# Patient Record
Sex: Female | Born: 1975 | Race: Black or African American | Hispanic: Yes | Marital: Single | Smoking: Current every day smoker
Health system: Southern US, Community
[De-identification: ages and names within clinical notes are randomized; demographics above are authoritative.]

## PROBLEM LIST (undated history)

## (undated) DIAGNOSIS — C3490 Malignant neoplasm of unspecified part of unspecified bronchus or lung: Secondary | ICD-10-CM

## (undated) DIAGNOSIS — F419 Anxiety disorder, unspecified: Secondary | ICD-10-CM

## (undated) DIAGNOSIS — M1990 Unspecified osteoarthritis, unspecified site: Secondary | ICD-10-CM

## (undated) DIAGNOSIS — K219 Gastro-esophageal reflux disease without esophagitis: Secondary | ICD-10-CM

## (undated) DIAGNOSIS — G43909 Migraine, unspecified, not intractable, without status migrainosus: Secondary | ICD-10-CM

## (undated) DIAGNOSIS — Z789 Other specified health status: Secondary | ICD-10-CM

## (undated) DIAGNOSIS — T7840XA Allergy, unspecified, initial encounter: Secondary | ICD-10-CM

## (undated) HISTORY — DX: Allergy, unspecified, initial encounter: T78.40XA

## (undated) HISTORY — PX: NO PAST SURGERIES: SHX2092

## (undated) HISTORY — DX: Anxiety disorder, unspecified: F41.9

## (undated) HISTORY — DX: Migraine, unspecified, not intractable, without status migrainosus: G43.909

## (undated) HISTORY — DX: Malignant neoplasm of unspecified part of unspecified bronchus or lung: C34.90

## (undated) HISTORY — DX: Unspecified osteoarthritis, unspecified site: M19.90

## (undated) HISTORY — DX: Gastro-esophageal reflux disease without esophagitis: K21.9

---

## 2004-12-21 HISTORY — PX: LASIK: SHX215

## 2008-03-12 ENCOUNTER — Emergency Department (HOSPITAL_COMMUNITY): Admission: EM | Admit: 2008-03-12 | Discharge: 2008-03-12 | Payer: Self-pay | Admitting: Emergency Medicine

## 2009-05-31 ENCOUNTER — Emergency Department (HOSPITAL_COMMUNITY): Admission: EM | Admit: 2009-05-31 | Discharge: 2009-06-01 | Payer: Self-pay | Admitting: Emergency Medicine

## 2011-03-30 LAB — URINALYSIS, ROUTINE W REFLEX MICROSCOPIC
Glucose, UA: NEGATIVE mg/dL
Ketones, ur: 15 mg/dL — AB
Leukocytes, UA: NEGATIVE
Nitrite: NEGATIVE
Protein, ur: NEGATIVE mg/dL
Urobilinogen, UA: 1 mg/dL (ref 0.0–1.0)

## 2011-03-30 LAB — URINE MICROSCOPIC-ADD ON

## 2013-05-31 ENCOUNTER — Encounter (HOSPITAL_COMMUNITY): Payer: Self-pay | Admitting: Emergency Medicine

## 2013-05-31 ENCOUNTER — Emergency Department (HOSPITAL_COMMUNITY)
Admission: EM | Admit: 2013-05-31 | Discharge: 2013-05-31 | Disposition: A | Discharge status: Home / Self Care | Payer: No Typology Code available for payment source | Attending: Emergency Medicine | Admitting: Emergency Medicine

## 2013-05-31 DIAGNOSIS — S99929A Unspecified injury of unspecified foot, initial encounter: Secondary | ICD-10-CM | POA: Insufficient documentation

## 2013-05-31 DIAGNOSIS — S139XXA Sprain of joints and ligaments of unspecified parts of neck, initial encounter: Secondary | ICD-10-CM | POA: Insufficient documentation

## 2013-05-31 DIAGNOSIS — Y92410 Unspecified street and highway as the place of occurrence of the external cause: Secondary | ICD-10-CM | POA: Insufficient documentation

## 2013-05-31 DIAGNOSIS — S8992XA Unspecified injury of left lower leg, initial encounter: Secondary | ICD-10-CM

## 2013-05-31 DIAGNOSIS — S8991XA Unspecified injury of right lower leg, initial encounter: Secondary | ICD-10-CM

## 2013-05-31 DIAGNOSIS — S8990XA Unspecified injury of unspecified lower leg, initial encounter: Secondary | ICD-10-CM | POA: Insufficient documentation

## 2013-05-31 DIAGNOSIS — Y9389 Activity, other specified: Secondary | ICD-10-CM | POA: Insufficient documentation

## 2013-05-31 DIAGNOSIS — S161XXA Strain of muscle, fascia and tendon at neck level, initial encounter: Secondary | ICD-10-CM

## 2013-05-31 DIAGNOSIS — F17200 Nicotine dependence, unspecified, uncomplicated: Secondary | ICD-10-CM | POA: Insufficient documentation

## 2013-05-31 MED ORDER — METHOCARBAMOL 750 MG PO TABS: 750.0000 mg | ORAL_TABLET | Freq: Four times a day (QID) | ORAL | Status: DC | PRN

## 2013-05-31 MED ORDER — NAPROXEN 500 MG PO TABS: 500.0000 mg | ORAL_TABLET | Freq: Two times a day (BID) | ORAL | Status: DC | PRN

## 2013-05-31 NOTE — ED Provider Notes (Signed)
  Medical screening examination/treatment/procedure(s) were performed by non-physician practitioner and as supervising physician I was immediately available for consultation/collaboration.    Gerhard Munch, MD 05/31/13 [phone]
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
## 2016-05-25 ENCOUNTER — Encounter (HOSPITAL_COMMUNITY): Payer: Self-pay | Admitting: Emergency Medicine

## 2016-05-25 ENCOUNTER — Emergency Department (HOSPITAL_COMMUNITY)
Admission: EM | Admit: 2016-05-25 | Discharge: 2016-05-25 | Discharge status: Against Medical Advice | Payer: 59 | Attending: Emergency Medicine | Admitting: Emergency Medicine

## 2016-05-25 DIAGNOSIS — F17200 Nicotine dependence, unspecified, uncomplicated: Secondary | ICD-10-CM | POA: Insufficient documentation

## 2016-05-25 DIAGNOSIS — N939 Abnormal uterine and vaginal bleeding, unspecified: Secondary | ICD-10-CM | POA: Diagnosis present

## 2016-05-25 DIAGNOSIS — N938 Other specified abnormal uterine and vaginal bleeding: Secondary | ICD-10-CM | POA: Diagnosis not present

## 2016-05-25 DIAGNOSIS — Z3202 Encounter for pregnancy test, result negative: Secondary | ICD-10-CM | POA: Insufficient documentation

## 2016-05-25 DIAGNOSIS — R61 Generalized hyperhidrosis: Secondary | ICD-10-CM | POA: Insufficient documentation

## 2016-05-25 LAB — WET PREP, GENITAL
Clue Cells Wet Prep HPF POC: NONE SEEN
Sperm: NONE SEEN
TRICH WET PREP: NONE SEEN
YEAST WET PREP: NONE SEEN

## 2016-05-25 LAB — CBC WITH DIFFERENTIAL/PLATELET
BASOS ABS: 0.1 10*3/uL (ref 0.0–0.1)
BASOS PCT: 1 %
Eosinophils Absolute: 0.2 10*3/uL (ref 0.0–0.7)
Eosinophils Relative: 2 %
HCT: 41 % (ref 36.0–46.0)
Hemoglobin: 13.8 g/dL (ref 12.0–15.0)
Lymphocytes Relative: 30 %
Lymphs Abs: 3.1 10*3/uL (ref 0.7–4.0)
MCH: 30 pg (ref 26.0–34.0)
MCHC: 33.7 g/dL (ref 30.0–36.0)
MCV: 89.1 fL (ref 78.0–100.0)
MONO ABS: 0.6 10*3/uL (ref 0.1–1.0)
Monocytes Relative: 5 %
NEUTROS ABS: 6.3 10*3/uL (ref 1.7–7.7)
NEUTROS PCT: 62 %
PLATELETS: 253 10*3/uL (ref 150–400)
RBC: 4.6 MIL/uL (ref 3.87–5.11)
RDW: 12.4 % (ref 11.5–15.5)
WBC: 10.2 10*3/uL (ref 4.0–10.5)

## 2016-05-25 LAB — I-STAT CHEM 8, ED
BUN: 13 mg/dL (ref 6–20)
CALCIUM ION: 1.19 mmol/L (ref 1.12–1.23)
Chloride: 102 mmol/L (ref 101–111)
Creatinine, Ser: 0.6 mg/dL (ref 0.44–1.00)
Glucose, Bld: 123 mg/dL — ABNORMAL HIGH (ref 65–99)
HCT: 43 % (ref 36.0–46.0)
Hemoglobin: 14.6 g/dL (ref 12.0–15.0)
Potassium: 3.4 mmol/L — ABNORMAL LOW (ref 3.5–5.1)
SODIUM: 141 mmol/L (ref 135–145)
TCO2: 26 mmol/L (ref 0–100)

## 2016-05-25 LAB — POC URINE PREG, ED: PREG TEST UR: NEGATIVE

## 2016-05-25 NOTE — ED Provider Notes (Signed)
CSN: [id number]     Arrival date & time 05/25/16  1825 History   First MD Initiated Contact with Patient 05/25/16 2126     Chief Complaint  Patient presents with  . Vaginal Bleeding     (Consider location/radiation/quality/duration/timing/severity/associated sxs/prior Treatment) HPI Comments: This a 40 year old female who reports that after intercourse on Saturday she noticed brown discharge and spotting.  She states prior to intercourse.  She had some abdominal pain, periumbilical, very vague in nature.  She thought her belt was too tight.  She denies any vaginal discharge, vaginal trauma, dysuria, frequency, history of seen.  She has one sexual partner.  They report that there was no trauma during intercourse.  Her partner does not have any piercings on his penile shaft.  She reports that she has 2 children ages 52 and 59 between those children.  She did not use any form of birth control and has not used any since and has not gotten pregnant.  She has not been to a physician in [place] 10 years.  No reported gynecological surgeries abnormal Pap smears, no family history of gynecological issues  Patient is a 40 y.o. female presenting with vaginal bleeding.  Vaginal Bleeding Quality:  Spotting Severity:  Moderate Onset quality:  Sudden Timing:  Rare Progression:  Improving Chronicity:  New Menstrual history:  Regular Number of pads used:  0 Number of tampons used:  0 Possible pregnancy: yes   Context: after intercourse   Context: not genital trauma   Relieved by:  None tried Worsened by:  Nothing tried Ineffective treatments:  None tried Associated symptoms: abdominal pain   Associated symptoms: no dyspareunia, no dysuria, no fever, no nausea and no vaginal discharge   Risk factors: unprotected sex   Risk factors: no gynecological surgery, does not have multiple partners, no new sexual partner, no ovarian cysts, no prior miscarriage and no STD     History reviewed. No pertinent past  medical history. History reviewed. No pertinent past surgical history. No family history on file. Social History  Substance Use Topics  . Smoking status: Current Every Day Smoker  . Smokeless tobacco: None  . Alcohol Use: No   OB History    No data available     Review of Systems  Constitutional: Negative for fever and chills.  Respiratory: Negative for shortness of breath.   Cardiovascular: Negative for chest pain.  Gastrointestinal: Positive for abdominal pain. Negative for nausea, vomiting, diarrhea and constipation.  Genitourinary: Positive for vaginal bleeding. Negative for dysuria, vaginal discharge, vaginal pain and dyspareunia.  Skin: Negative for rash.  All other systems reviewed and are negative.     Allergies  Review of patient's allergies indicates no known allergies.  Home Medications   Prior to Admission medications   Medication Sig Start Date End Date Taking? Authorizing Provider  methocarbamol (ROBAXIN) 750 MG tablet Take 1 tablet (750 mg total) by mouth 4 (four) times daily as needed (Take 1 tablet every 6 hours as needed for muscle spasms.). Patient not taking: Reported on 05/25/2016 05/31/13   Jarrett Soho Muthersbaugh, PA-C  naproxen (NAPROSYN) 500 MG tablet Take 1 tablet (500 mg total) by mouth 2 (two) times daily as needed. Patient not taking: Reported on 05/25/2016 05/31/13   Jarrett Soho Muthersbaugh, PA-C   BP 129/94 mmHg  Pulse 82  Temp(Src) 98.2 F (36.8 C) (Oral)  Resp 16  SpO2 99%  LMP 03/30/2016 (Approximate) Physical Exam  Constitutional: She is oriented to person, place, and time. She appears well-developed  and well-nourished.  HENT:  Head: Normocephalic.  Eyes: Pupils are equal, round, and reactive to light.  Neck: Normal range of motion.  Cardiovascular: Normal rate.   Pulmonary/Chest: Effort normal and breath sounds normal.  Abdominal: Soft. She exhibits no distension. There is no tenderness.  Genitourinary: Vagina normal and uterus normal. Cervix  exhibits no motion tenderness and no discharge. Right adnexum displays no tenderness and no fullness. Left adnexum displays no tenderness and no fullness. No vaginal discharge found.  Do not see any trauma to the vaginal walls or introitus.  There is a small very small amount of brown speckles in what appears to be normal.  Vaginal discharge.  Cervical os is closed.  Cervix is atraumatic  Musculoskeletal: Normal range of motion.  Neurological: She is alert and oriented to person, place, and time.  Skin: Skin is warm. She is diaphoretic.  Nursing note and vitals reviewed.   ED Course  Procedures (including critical care time) Labs Review Labs Reviewed  WET PREP, GENITAL - Abnormal; Notable for the following:    WBC, Wet Prep HPF POC MODERATE (*)    All other components within normal limits  I-STAT CHEM 8, ED - Abnormal; Notable for the following:    Potassium 3.4 (*)    Glucose, Bld 123 (*)    All other components within normal limits  CBC WITH DIFFERENTIAL/PLATELET  POC URINE PREG, ED  GC/CHLAMYDIA PROBE AMP () NOT AT [facility]    Imaging Review No results found. I have personally reviewed and evaluated these images and lab results as part of my medical decision-making.   EKG Interpretation None     Patient left prior to results  MDM   Final diagnoses:  Vaginal spotting         Junius Creamer, NP 05/25/16 [place], MD 05/26/16 [phone]

## 2016-05-25 NOTE — ED Notes (Signed)
Patient spoke with tech and left AMA, did not want to wait for results. NP notified.

## 2016-05-25 NOTE — ED Notes (Signed)
Patient with bleeding after intercourse on Saturday.  Patient states that she is bleeding lightly now.  She is not sure if she had a miscarriage, but her and her partner had clots on them after intercourse.  Patient has also had headaches and vomiting.  She states she is still cramping.

## 2016-05-25 NOTE — ED Notes (Signed)
No answer in waiting.

## 2016-05-27 LAB — GC/CHLAMYDIA PROBE AMP (~~LOC~~) NOT AT ARMC
Chlamydia: NEGATIVE
NEISSERIA GONORRHEA: NEGATIVE

## 2016-09-17 ENCOUNTER — Encounter (HOSPITAL_COMMUNITY): Payer: Self-pay | Admitting: *Deleted

## 2016-09-17 ENCOUNTER — Emergency Department (HOSPITAL_COMMUNITY)
Admission: EM | Admit: 2016-09-17 | Discharge: 2016-09-18 | Disposition: A | Discharge status: Home / Self Care | Payer: Medicaid Other | Attending: Emergency Medicine | Admitting: Emergency Medicine

## 2016-09-17 DIAGNOSIS — R1031 Right lower quadrant pain: Secondary | ICD-10-CM

## 2016-09-17 DIAGNOSIS — Z3A01 Less than 8 weeks gestation of pregnancy: Secondary | ICD-10-CM | POA: Insufficient documentation

## 2016-09-17 DIAGNOSIS — O0281 Inappropriate change in quantitative human chorionic gonadotropin (hCG) in early pregnancy: Secondary | ICD-10-CM | POA: Insufficient documentation

## 2016-09-17 DIAGNOSIS — R109 Unspecified abdominal pain: Secondary | ICD-10-CM

## 2016-09-17 DIAGNOSIS — R1030 Lower abdominal pain, unspecified: Secondary | ICD-10-CM | POA: Diagnosis present

## 2016-09-17 DIAGNOSIS — O2341 Unspecified infection of urinary tract in pregnancy, first trimester: Secondary | ICD-10-CM | POA: Insufficient documentation

## 2016-09-17 DIAGNOSIS — O26899 Other specified pregnancy related conditions, unspecified trimester: Secondary | ICD-10-CM

## 2016-09-17 DIAGNOSIS — Z87891 Personal history of nicotine dependence: Secondary | ICD-10-CM | POA: Diagnosis not present

## 2016-09-17 LAB — URINALYSIS, ROUTINE W REFLEX MICROSCOPIC
Bilirubin Urine: NEGATIVE
Glucose, UA: NEGATIVE mg/dL
Ketones, ur: NEGATIVE mg/dL
LEUKOCYTES UA: NEGATIVE
NITRITE: NEGATIVE
PH: 5.5 (ref 5.0–8.0)
Protein, ur: NEGATIVE mg/dL
SPECIFIC GRAVITY, URINE: 1.028 (ref 1.005–1.030)

## 2016-09-17 LAB — COMPREHENSIVE METABOLIC PANEL
ALK PHOS: 44 U/L (ref 38–126)
ALT: 22 U/L (ref 14–54)
AST: 16 U/L (ref 15–41)
Albumin: 4.3 g/dL (ref 3.5–5.0)
Anion gap: 8 (ref 5–15)
BUN: 12 mg/dL (ref 6–20)
CHLORIDE: 102 mmol/L (ref 101–111)
CO2: 26 mmol/L (ref 22–32)
CREATININE: 0.67 mg/dL (ref 0.44–1.00)
Calcium: 9.6 mg/dL (ref 8.9–10.3)
Glucose, Bld: 115 mg/dL — ABNORMAL HIGH (ref 65–99)
Potassium: 3.5 mmol/L (ref 3.5–5.1)
SODIUM: 136 mmol/L (ref 135–145)
TOTAL PROTEIN: 6.7 g/dL (ref 6.5–8.1)
Total Bilirubin: 0.5 mg/dL (ref 0.3–1.2)

## 2016-09-17 LAB — CBC
HEMATOCRIT: 42.2 % (ref 36.0–46.0)
HEMOGLOBIN: 14 g/dL (ref 12.0–15.0)
MCH: 30.2 pg (ref 26.0–34.0)
MCHC: 33.2 g/dL (ref 30.0–36.0)
MCV: 90.9 fL (ref 78.0–100.0)
Platelets: 258 10*3/uL (ref 150–400)
RBC: 4.64 MIL/uL (ref 3.87–5.11)
RDW: 12.4 % (ref 11.5–15.5)
WBC: 13.5 10*3/uL — AB (ref 4.0–10.5)

## 2016-09-17 LAB — I-STAT BETA HCG BLOOD, ED (MC, WL, AP ONLY): I-stat hCG, quantitative: >2000 m[IU]/mL — ABNORMAL HIGH (ref <5)

## 2016-09-17 LAB — URINE MICROSCOPIC-ADD ON

## 2016-09-17 NOTE — ED Triage Notes (Signed)
Patient presents with c/o lower abd pain.  States she knows she is pregnant but not how far along

## 2016-09-18 ENCOUNTER — Emergency Department (HOSPITAL_COMMUNITY): Payer: Medicaid Other

## 2016-09-18 LAB — GC/CHLAMYDIA PROBE AMP (~~LOC~~) NOT AT ARMC
CHLAMYDIA, DNA PROBE: NEGATIVE
NEISSERIA GONORRHEA: NEGATIVE

## 2016-09-18 LAB — HIV ANTIBODY (ROUTINE TESTING W REFLEX): HIV SCREEN 4TH GENERATION: NONREACTIVE

## 2016-09-18 LAB — RPR: RPR Ser Ql: NONREACTIVE

## 2016-09-18 LAB — WET PREP, GENITAL
CLUE CELLS WET PREP: NONE SEEN
Sperm: NONE SEEN
TRICH WET PREP: NONE SEEN
WBC, Wet Prep HPF POC: NONE SEEN
YEAST WET PREP: NONE SEEN

## 2016-09-18 LAB — ABO/RH: ABO/RH(D): A POS

## 2016-09-18 MED ORDER — ACETAMINOPHEN 500 MG PO TABS: 1000.0000 mg | ORAL_TABLET | Freq: Four times a day (QID) | ORAL | 0 refills | Status: DC | PRN

## 2016-09-18 MED ORDER — CEPHALEXIN 500 MG PO CAPS: 1000.0000 mg | ORAL_CAPSULE | Freq: Two times a day (BID) | ORAL | 0 refills | Status: DC

## 2016-09-18 MED ORDER — ACETAMINOPHEN 500 MG PO TABS
1000.0000 mg | ORAL_TABLET | Freq: Once | ORAL | Status: AC
  Administered 2016-09-18: 1000 mg via ORAL
  Filled 2016-09-18: qty 2

## 2016-09-18 MED ORDER — CEPHALEXIN 250 MG PO CAPS
1000.0000 mg | ORAL_CAPSULE | Freq: Once | ORAL | Status: AC
  Administered 2016-09-18: 1000 mg via ORAL
  Filled 2016-09-18: qty 4

## 2016-09-18 NOTE — ED Notes (Signed)
Notified phlebotomy pt is back in room at this time

## 2016-09-18 NOTE — ED Provider Notes (Signed)
[facility] DEPT Provider Note   CSN: [id number] Arrival date & time: 09/17/16  2024  By signing my name below, I, Higinio Plan, attest that this documentation has been prepared under the direction and in the presence of Charlesetta Shanks, MD . Electronically Signed: Higinio Plan, Scribe. 09/18/2016. 12:43 AM.  History   Chief Complaint Chief Complaint  Patient presents with  . Abdominal Pain   The history is provided by the patient. No language interpreter was used.   HPI Comments: Megan Colon is a 40 y.o. female who presents to the Emergency Department complaining of sudden onset, gradually worsening, lower abdominal pain that began 3 days ago and worsened today. Pt states associated bilateral lower back pain, subjective fever, chills and 1 episode of dysuria 2 days ago. She notes she has also had difficulty sleeping over the past few days to pain. Pt denies nausea, vomiting, leg swelling and vaginal bleeding. Pt reports she is aware she is pregnant but does not know how far along she is. She states her last normal menstrual period was in June but she experienced spotting in both July and August. She notes she has an appointment with [facility] on 10/17. She reports hx of 2 pregnancies in the past that were delivered via vaginal deliveries at full term with no complications.   History reviewed. No pertinent past medical history.  There are no active problems to display for this patient.  History reviewed. No pertinent surgical history.  OB History    Gravida Para Term Preterm AB Living   1             SAB TAB Ectopic Multiple Live Births                   Home Medications    Prior to Admission medications   Medication Sig Start Date End Date Taking? Authorizing Provider  acetaminophen (TYLENOL) 500 MG tablet Take 2 tablets (1,000 mg total) by mouth every 6 (six) hours as needed. 09/18/16   Charlesetta Shanks, MD  cephALEXin (KEFLEX) 500 MG capsule Take 2 capsules (1,000 mg  total) by mouth 2 (two) times daily. 09/18/16   Charlesetta Shanks, MD  methocarbamol (ROBAXIN) 750 MG tablet Take 1 tablet (750 mg total) by mouth 4 (four) times daily as needed (Take 1 tablet every 6 hours as needed for muscle spasms.). Patient not taking: Reported on 05/25/2016 05/31/13   Jarrett Soho Muthersbaugh, PA-C  naproxen (NAPROSYN) 500 MG tablet Take 1 tablet (500 mg total) by mouth 2 (two) times daily as needed. Patient not taking: Reported on 05/25/2016 05/31/13   Jarrett Soho Muthersbaugh, PA-C    Family History No family history on file.  Social History Social History  Substance Use Topics  . Smoking status: Former Smoker    Quit date: 06/17/2016  . Smokeless tobacco: Never Used  . Alcohol use No     Allergies   Review of patient's allergies indicates no known allergies.   Review of Systems Review of Systems 10 systems reviewed and all are negative for acute change except as noted in the HPI.  Physical Exam Updated Vital Signs BP 128/88   Pulse 89   Temp 98.8 F (37.1 C) (Oral)   Resp 18   Ht 5' (1.524 m)   Wt 155 lb (70.3 kg)   LMP 06/14/2016   SpO2 100%   BMI 30.27 kg/m   Physical Exam  Constitutional: She is oriented to person, place, and time. She appears well-developed and well-nourished. No  distress.  Well appearing, nontoxic, alert, NAD.  HENT:  Head: Normocephalic and atraumatic.  Mucous membranes pink and moist, dentition good condition.  Eyes: EOM are normal. Pupils are equal, round, and reactive to light. No scleral icterus.  Neck: Normal range of motion. Neck supple. No thyromegaly present.  Cardiovascular: Normal rate, regular rhythm and normal heart sounds.   Pulmonary/Chest: Effort normal and breath sounds normal.  Abdominal: Soft. She exhibits no distension. There is tenderness.  Moderate right and suprapubic lower tenderness without guarding.   Genitourinary: Vagina normal.  Genitourinary Comments: Chaperone was present for exam which was performed with  no discomfort or complications. Normal cervix, no bleeding and no d/c  Musculoskeletal: Normal range of motion.  Back: normal visual inspection, no CVA tenderness, Lower extremities: calves soft and non-tender, no peripheral edema.  Lymphadenopathy:    She has no cervical adenopathy.  Neurological: She is alert and oriented to person, place, and time.  Normal tone, movement coordinated and symmetric   Skin: Skin is warm and dry.  Psychiatric: She has a normal mood and affect. Judgment normal.  Nursing note and vitals reviewed.  ED Treatments / Results  Labs (all labs ordered are listed, but only abnormal results are displayed) Labs Reviewed  COMPREHENSIVE METABOLIC PANEL - Abnormal; Notable for the following:       Result Value   Glucose, Bld 115 (*)    All other components within normal limits  CBC - Abnormal; Notable for the following:    WBC 13.5 (*)    All other components within normal limits  URINALYSIS, ROUTINE W REFLEX MICROSCOPIC (NOT AT [facility]) - Abnormal; Notable for the following:    APPearance CLOUDY (*)    Hgb urine dipstick LARGE (*)    All other components within normal limits  URINE MICROSCOPIC-ADD ON - Abnormal; Notable for the following:    Squamous Epithelial / LPF 0-5 (*)    Bacteria, UA FEW (*)    All other components within normal limits  I-STAT BETA HCG BLOOD, ED ([facility], [facility], [facility] ONLY) - Abnormal; Notable for the following:    I-stat hCG, quantitative >2,000.0 (*)    All other components within normal limits  URINE CULTURE  WET PREP, GENITAL  RPR  HIV ANTIBODY (ROUTINE TESTING)  HCG, QUANTITATIVE, PREGNANCY  ABO/RH  GC/CHLAMYDIA PROBE AMP ([place]) NOT AT [facility]    EKG  EKG Interpretation None       Radiology US Ob Comp Less 14 Wks  Result Date: 09/18/2016 CLINICAL DATA:  40 year old female with positive HCG levels presenting with right lower quadrant abdominal pain EXAM: OBSTETRIC <14 WK [place] AND TRANSVAGINAL OB US TECHNIQUE: Both transabdominal  and transvaginal ultrasound examinations were performed for complete evaluation of the gestation as well as the maternal uterus, adnexal regions, and pelvic cul-de-sac. Transvaginal technique was performed to assess early pregnancy. COMPARISON:  None. FINDINGS: The uterus is anteverted and heterogeneous. There is a 1.1 x 0.9 x 1.0 cm hypoechoic a area within the anterior body myometrium most likely a fibroid. The endometrium is thickened. There is a single intrauterine gestational sac with surrounding decidual reaction. A yolk sac is identified within the gestational sac. No fetal pole identified at this time. The estimated gestational age based on mean sac diameter of 8 mm is 5 weeks, 4 days. Subchorionic hemorrhage:  None visualized. Maternal uterus/adnexae: The ovaries appear unremarkable. The right ovary measures 2.5 x 1.8 x 1.9 cm and the left ovary measures 3.0 x 2.6 x 2.7  cm. Small free fluid noted within the pelvis. IMPRESSION: Single intrauterine gestational sac with an estimated gestational age of [redacted] weeks 4 days. No fetal pole identified at this time. Follow-up recommended. Heterogeneous uterus with a small anterior body mural fibroid. Electronically Signed   By: Anner Crete M.D.   On: 09/18/2016 02:32   US Ob Transvaginal  Result Date: 09/18/2016 CLINICAL DATA:  40 year old female with positive HCG levels presenting with right lower quadrant abdominal pain EXAM: OBSTETRIC <14 WK [place] AND TRANSVAGINAL OB US TECHNIQUE: Both transabdominal and transvaginal ultrasound examinations were performed for complete evaluation of the gestation as well as the maternal uterus, adnexal regions, and pelvic cul-de-sac. Transvaginal technique was performed to assess early pregnancy. COMPARISON:  None. FINDINGS: The uterus is anteverted and heterogeneous. There is a 1.1 x 0.9 x 1.0 cm hypoechoic a area within the anterior body myometrium most likely a fibroid. The endometrium is thickened. There is a single  intrauterine gestational sac with surrounding decidual reaction. A yolk sac is identified within the gestational sac. No fetal pole identified at this time. The estimated gestational age based on mean sac diameter of 8 mm is 5 weeks, 4 days. Subchorionic hemorrhage:  None visualized. Maternal uterus/adnexae: The ovaries appear unremarkable. The right ovary measures 2.5 x 1.8 x 1.9 cm and the left ovary measures 3.0 x 2.6 x 2.7 cm. Small free fluid noted within the pelvis. IMPRESSION: Single intrauterine gestational sac with an estimated gestational age of [redacted] weeks 4 days. No fetal pole identified at this time. Follow-up recommended. Heterogeneous uterus with a small anterior body mural fibroid. Electronically Signed   By: Anner Crete M.D.   On: 09/18/2016 02:32    Procedures Procedures (including critical care time)  Medications Ordered in ED Medications  cephALEXin (KEFLEX) capsule 1,000 mg (not administered)  acetaminophen (TYLENOL) tablet 1,000 mg (not administered)    DIAGNOSTIC STUDIES:  Oxygen Saturation is 100% on RA, normal by my interpretation.    COORDINATION OF CARE:  12:35 AM Discussed treatment plan with pt at bedside and pt agreed to plan.  Initial Impression / Assessment and Plan / ED Course  I have reviewed the triage vital signs and the nursing notes.  Pertinent labs & imaging results that were available during my care of the patient were reviewed by me and considered in my medical decision making (see chart for details).  Clinical Course    I personally performed the services described in this documentation, which was scribed in my presence. The recorded information has been reviewed and is accurate.   Final Clinical Impressions(s) / ED Diagnoses   Final diagnoses:  Abdominal pain during pregnancy   Patient has mildly positive UA with some hematuria. In light of pregnancy and lower abdominal discomfort, I will opt to treat with Keflex. Culture will be pending.  Patient otherwise is very well in appearance. Ultrasound confirms IUP. She has no vaginal discharge or cervical friability. Patient has scheduled OB follow-up in mid October. She is counseled on signs and symptoms were to return. New Prescriptions New Prescriptions   ACETAMINOPHEN (TYLENOL) 500 MG TABLET    Take 2 tablets (1,000 mg total) by mouth every 6 (six) hours as needed.   CEPHALEXIN (KEFLEX) 500 MG CAPSULE    Take 2 capsules (1,000 mg total) by mouth 2 (two) times daily.     Charlesetta Shanks, MD 09/18/16 [phone]

## 2016-09-18 NOTE — ED Notes (Signed)
Pelvic cart set up and in room. Dr Johnney Killian in room with Charlett Nose as chaperone for Pelvic exam

## 2016-09-19 LAB — URINE CULTURE

## 2016-10-06 ENCOUNTER — Encounter: Payer: Self-pay | Admitting: Certified Nurse Midwife

## 2016-10-07 ENCOUNTER — Encounter: Payer: Self-pay | Admitting: Obstetrics

## 2016-10-07 ENCOUNTER — Other Ambulatory Visit (HOSPITAL_COMMUNITY)
Admission: RE | Admit: 2016-10-07 | Discharge: 2016-10-07 | Disposition: A | Discharge status: Home / Self Care | Payer: Medicaid Other | Source: Ambulatory Visit | Attending: Obstetrics | Admitting: Obstetrics

## 2016-10-07 ENCOUNTER — Ambulatory Visit (INDEPENDENT_AMBULATORY_CARE_PROVIDER_SITE_OTHER): Payer: Medicaid Other | Admitting: Obstetrics

## 2016-10-07 ENCOUNTER — Encounter: Payer: Self-pay | Admitting: *Deleted

## 2016-10-07 VITALS — BP 131/93 | HR 97 | Temp 99.2°F | Wt 161.2 lb

## 2016-10-07 DIAGNOSIS — Z113 Encounter for screening for infections with a predominantly sexual mode of transmission: Secondary | ICD-10-CM | POA: Insufficient documentation

## 2016-10-07 DIAGNOSIS — Z1151 Encounter for screening for human papillomavirus (HPV): Secondary | ICD-10-CM | POA: Diagnosis present

## 2016-10-07 DIAGNOSIS — N760 Acute vaginitis: Secondary | ICD-10-CM | POA: Diagnosis present

## 2016-10-07 DIAGNOSIS — Z01411 Encounter for gynecological examination (general) (routine) with abnormal findings: Secondary | ICD-10-CM | POA: Insufficient documentation

## 2016-10-07 DIAGNOSIS — Z3491 Encounter for supervision of normal pregnancy, unspecified, first trimester: Secondary | ICD-10-CM

## 2016-10-07 DIAGNOSIS — O09521 Supervision of elderly multigravida, first trimester: Secondary | ICD-10-CM

## 2016-10-07 NOTE — Progress Notes (Signed)
Subjective:    Megan Colon is being seen today for her first obstetrical visit.  This is not a planned pregnancy. She is at 79w1dgestation. Her obstetrical history is significant for advanced maternal age and obesity. Relationship with FOB: significant other, not living together. Patient does intend to breast feed. Pregnancy history fully reviewed.  The information documented in the HPI was reviewed and verified.  Menstrual History: OB History    Gravida Para Term Preterm AB Living   1         2   SAB TAB Ectopic Multiple Live Births           2       Patient's last menstrual period was 07/14/2016 (lmp unknown).    History reviewed. No pertinent past medical history.  History reviewed. No pertinent surgical history.   (Not in a hospital admission) No Known Allergies  Social History  Substance Use Topics  . Smoking status: Former Smoker    Quit date: 06/17/2016  . Smokeless tobacco: Never Used  . Alcohol use No    Family History  Problem Relation Age of Onset  . Hypertension Mother   . Diabetes Father      Review of Systems Constitutional: negative for weight loss Gastrointestinal: negative for vomiting Genitourinary:negative for genital lesions and vaginal discharge and dysuria Musculoskeletal:negative for back pain Behavioral/Psych: negative for abusive relationship, depression, illegal drug usage and tobacco use    Objective:    BP (!) 131/93   Pulse 97   Temp 99.2 F (37.3 C)   Wt 161 lb 3.2 oz (73.1 kg)   LMP 07/14/2016 (LMP Unknown)   BMI 31.48 kg/m  General Appearance:    Alert, cooperative, no distress, appears stated age  Head:    Normocephalic, without obvious abnormality, atraumatic  Eyes:    PERRL, conjunctiva/corneas clear, EOM's intact, fundi    benign, both eyes  Ears:    Normal TM's and external ear canals, both ears  Nose:   Nares normal, septum midline, mucosa normal, no drainage    or sinus tenderness  Throat:   Lips, mucosa, and tongue  normal; teeth and gums normal  Neck:   Supple, symmetrical, trachea midline, no adenopathy;    thyroid:  no enlargement/tenderness/nodules; no carotid   bruit or JVD  Back:     Symmetric, no curvature, ROM normal, no CVA tenderness  Lungs:     Clear to auscultation bilaterally, respirations unlabored  Chest Wall:    No tenderness or deformity   Heart:    Regular rate and rhythm, S1 and S2 normal, no murmur, rub   or gallop  Breast Exam:    No tenderness, masses, or nipple abnormality  Abdomen:     Soft, non-tender, bowel sounds active all four quadrants,    no masses, no organomegaly  Genitalia:    Normal female without lesion, discharge or tenderness  Extremities:   Extremities normal, atraumatic, no cyanosis or edema  Pulses:   2+ and symmetric all extremities  Skin:   Skin color, texture, turgor normal, no rashes or lesions  Lymph nodes:   Cervical, supraclavicular, and axillary nodes normal  Neurologic:   CNII-XII intact, normal strength, sensation and reflexes    throughout      Lab Review Urine pregnancy test Labs reviewed yes Radiologic studies reviewed yes Assessment:    Pregnancy at 154w1deeks    AMA   Plan:     Referred to MFM for Genetics , Panorama and  Nuchal Fold measurement  Prenatal vitamins.  Counseling provided regarding continued use of seat belts, cessation of alcohol consumption, smoking or use of illicit drugs; infection precautions i.e., influenza/TDAP immunizations, toxoplasmosis,CMV, parvovirus, listeria and varicella; workplace safety, exercise during pregnancy; routine dental care, safe medications, sexual activity, hot tubs, saunas, pools, travel, caffeine use, fish and methlymercury, potential toxins, hair treatments, varicose veins Weight gain recommendations per IOM guidelines reviewed: underweight/BMI< 18.5--> gain 28 - 40 lbs; normal weight/BMI 18.5 - 24.9--> gain 25 - 35 lbs; overweight/BMI 25 - 29.9--> gain 15 - 25 lbs; obese/BMI >30->gain  11 -  20 lbs Problem list reviewed and updated. FIRST/CF mutation testing/NIPT/QUAD SCREEN/fragile X/Ashkenazi Jewish population testing/Spinal muscular atrophy discussed: requested. Role of ultrasound in pregnancy discussed; fetal survey: requested. Amniocentesis discussed: undecided. VBAC calculator score: VBAC consent form provided No orders of the defined types were placed in this encounter.  No orders of the defined types were placed in this encounter.   Follow up in 4 weeks. 50% of 20 min visit spent on counseling and coordination of care.

## 2016-10-08 LAB — CERVICOVAGINAL ANCILLARY ONLY
CHLAMYDIA, DNA PROBE: NEGATIVE
NEISSERIA GONORRHEA: NEGATIVE
Trichomonas: NEGATIVE

## 2016-10-09 LAB — URINE CULTURE, OB REFLEX: ORGANISM ID, BACTERIA: NO GROWTH

## 2016-10-09 LAB — CULTURE, OB URINE

## 2016-10-12 ENCOUNTER — Other Ambulatory Visit: Payer: Self-pay | Admitting: Obstetrics

## 2016-10-12 DIAGNOSIS — G44009 Cluster headache syndrome, unspecified, not intractable: Secondary | ICD-10-CM

## 2016-10-12 LAB — CERVICOVAGINAL ANCILLARY ONLY
Bacterial vaginitis: POSITIVE — AB
Candida vaginitis: NEGATIVE

## 2016-10-12 LAB — CYTOLOGY - PAP: HPV: DETECTED — AB

## 2016-10-12 MED ORDER — BUTALBITAL-APAP-CAFFEINE 50-325-40 MG PO TABS: 2.0000 | ORAL_TABLET | Freq: Four times a day (QID) | ORAL | 2 refills | Status: DC | PRN

## 2016-10-13 ENCOUNTER — Other Ambulatory Visit: Payer: Self-pay | Admitting: Obstetrics

## 2016-10-13 MED ORDER — METRONIDAZOLE 500 MG PO TABS: 500.0000 mg | ORAL_TABLET | Freq: Two times a day (BID) | ORAL | 2 refills | Status: DC

## 2016-10-13 NOTE — Addendum Note (Signed)
Addended by: Tamela Oddi on: 10/13/2016 09:37 AM   Modules accepted: Orders

## 2016-10-14 LAB — PRENATAL PROFILE I(LABCORP)
Antibody Screen: NEGATIVE
BASOS: 0 %
Basophils Absolute: 0 10*3/uL (ref 0.0–0.2)
EOS (ABSOLUTE): 0.2 10*3/uL (ref 0.0–0.4)
Eos: 2 %
HEMATOCRIT: 40.2 % (ref 34.0–46.6)
Hemoglobin: 13.6 g/dL (ref 11.1–15.9)
Hepatitis B Surface Ag: NEGATIVE
Immature Grans (Abs): 0 10*3/uL (ref 0.0–0.1)
Immature Granulocytes: 0 %
Lymphocytes Absolute: 2.2 10*3/uL (ref 0.7–3.1)
Lymphs: 19 %
MCH: 30.8 pg (ref 26.6–33.0)
MCHC: 33.8 g/dL (ref 31.5–35.7)
MCV: 91 fL (ref 79–97)
MONOCYTES: 6 %
Monocytes Absolute: 0.7 10*3/uL (ref 0.1–0.9)
NEUTROS ABS: 8.5 10*3/uL — AB (ref 1.4–7.0)
Neutrophils: 73 %
Platelets: 289 10*3/uL (ref 150–379)
RBC: 4.41 x10E6/uL (ref 3.77–5.28)
RDW: 12.9 % (ref 12.3–15.4)
RH TYPE: POSITIVE
RPR: NONREACTIVE
Rubella Antibodies, IGG: 1.23 index (ref >0.99)
WBC: 11.6 10*3/uL — AB (ref 3.4–10.8)

## 2016-10-14 LAB — VARICELLA ZOSTER ANTIBODY, IGG: VARICELLA: 929 {index} (ref >165)

## 2016-10-14 LAB — TOXASSURE SELECT 13 (MW), URINE

## 2016-10-14 LAB — HEMOGLOBINOPATHY EVALUATION
HEMOGLOBIN A2 QUANTITATION: 2.9 % (ref 0.7–3.1)
HGB C: 0 %
HGB S: 0 %
Hemoglobin F Quantitation: 0 % (ref 0.0–2.0)
Hgb A: 97.1 % (ref 94.0–98.0)

## 2016-10-14 LAB — HIV ANTIBODY (ROUTINE TESTING W REFLEX): HIV SCREEN 4TH GENERATION: NONREACTIVE

## 2016-10-14 LAB — VITAMIN D 25 HYDROXY (VIT D DEFICIENCY, FRACTURES): VIT D 25 HYDROXY: 30 ng/mL (ref 30.0–100.0)

## 2016-10-21 ENCOUNTER — Other Ambulatory Visit: Payer: Self-pay | Admitting: Obstetrics

## 2016-10-21 DIAGNOSIS — B9689 Other specified bacterial agents as the cause of diseases classified elsewhere: Secondary | ICD-10-CM

## 2016-10-21 DIAGNOSIS — O09519 Supervision of elderly primigravida, unspecified trimester: Secondary | ICD-10-CM

## 2016-10-21 DIAGNOSIS — K219 Gastro-esophageal reflux disease without esophagitis: Secondary | ICD-10-CM

## 2016-10-21 DIAGNOSIS — N760 Acute vaginitis: Secondary | ICD-10-CM

## 2016-10-21 MED ORDER — OB COMPLETE PETITE 35-5-1-200 MG PO CAPS: 1.0000 | ORAL_CAPSULE | Freq: Every day | ORAL | 3 refills | Status: DC

## 2016-10-21 MED ORDER — RANITIDINE HCL 150 MG PO TABS: 150.0000 mg | ORAL_TABLET | Freq: Two times a day (BID) | ORAL | 6 refills | Status: DC

## 2016-10-22 ENCOUNTER — Encounter (HOSPITAL_COMMUNITY): Payer: Self-pay | Admitting: Obstetrics

## 2016-10-29 ENCOUNTER — Encounter (HOSPITAL_COMMUNITY): Payer: Self-pay | Admitting: Obstetrics

## 2016-11-04 ENCOUNTER — Encounter: Payer: Medicaid Other | Admitting: Obstetrics

## 2016-11-06 ENCOUNTER — Ambulatory Visit (HOSPITAL_COMMUNITY): Payer: Medicaid Other

## 2016-11-06 ENCOUNTER — Ambulatory Visit (HOSPITAL_COMMUNITY)
Admission: RE | Admit: 2016-11-06 | Discharge: 2016-11-06 | Disposition: A | Discharge status: Home / Self Care | Payer: Medicaid Other | Source: Ambulatory Visit | Attending: Obstetrics | Admitting: Obstetrics

## 2016-11-06 ENCOUNTER — Ambulatory Visit (HOSPITAL_COMMUNITY): Payer: No Typology Code available for payment source

## 2016-11-06 ENCOUNTER — Other Ambulatory Visit: Payer: Self-pay | Admitting: Obstetrics

## 2016-11-06 ENCOUNTER — Other Ambulatory Visit (HOSPITAL_COMMUNITY): Payer: Medicaid Other

## 2016-11-06 ENCOUNTER — Encounter (HOSPITAL_COMMUNITY): Payer: Self-pay

## 2016-11-06 ENCOUNTER — Other Ambulatory Visit (HOSPITAL_COMMUNITY): Payer: Self-pay | Admitting: *Deleted

## 2016-11-06 DIAGNOSIS — O09529 Supervision of elderly multigravida, unspecified trimester: Secondary | ICD-10-CM

## 2016-11-06 DIAGNOSIS — Z3A12 12 weeks gestation of pregnancy: Secondary | ICD-10-CM | POA: Diagnosis not present

## 2016-11-06 DIAGNOSIS — Z3682 Encounter for antenatal screening for nuchal translucency: Secondary | ICD-10-CM | POA: Diagnosis not present

## 2016-11-06 DIAGNOSIS — O09521 Supervision of elderly multigravida, first trimester: Secondary | ICD-10-CM

## 2016-11-06 DIAGNOSIS — Z315 Encounter for genetic counseling: Secondary | ICD-10-CM | POA: Insufficient documentation

## 2016-11-06 DIAGNOSIS — Z3491 Encounter for supervision of normal pregnancy, unspecified, first trimester: Secondary | ICD-10-CM

## 2016-11-06 HISTORY — DX: Other specified health status: Z78.9

## 2016-11-06 NOTE — Progress Notes (Signed)
Genetic Counseling  High-Risk Gestation Note  Appointment Date:  11/06/2016 Referred By: Shelly Bombard, MD Date of Birth:  [birth year] Partner:  Megan Colon   Pregnancy History: [id number] Estimated Date of Delivery: 05/17/17 Estimated Gestational Age: 80w4dAttending: PBenjaman Lobe MD   Ms. Megan Colon her partner, Mr. Megan Colon were seen for genetic counseling because of a maternal age of 40y.o..     In summary:  Discussed AMA and associated risk for fetal aneuploidy  Discussed options for screening  First screen-declined blood work  Quad screen-declined  NIPS-elected to pursue Panorama today  Ultrasound-NT ultrasound performed today; detailed anatomy scheduled 12/18/16  Discussed diagnostic testing options  [facility]-declined  Amniocentesis-declined  Reviewed family history concerns  Discussed carrier screening options - declined  CF  SMA  Hemoglobinopathies  They were counseled regarding maternal age and the association with risk for chromosome conditions due to nondisjunction with aging of the ova.   We reviewed chromosomes, nondisjunction, and the associated 1 in 214risk for fetal aneuploidy related to a maternal age of 40y.o. at 40w4destation.  They were counseled that the risk for aneuploidy decreases as gestational age increases, accounting for those pregnancies which spontaneously abort.  We specifically discussed Down syndrome (trisomy 2181 trisomies 13104nd 18110and sex chromosome aneuploidies (47,XXX and 47,XXY) including the common features and prognoses of each.   We reviewed available screening options including First Screen, Quad screen, noninvasive prenatal screening (NIPS)/cell free DNA (cfDNA) screening, and detailed ultrasound.  They were counseled that screening tests are used to modify a patient's a priori risk for aneuploidy, typically based on age. This estimate provides a pregnancy specific risk assessment. We reviewed the  benefits and limitations of each option. Specifically, we discussed the conditions for which each test screens, the detection rates, and false positive rates of each. They were also counseled regarding diagnostic testing via [facility] and amniocentesis. We reviewed the approximate 1 in 30706-237isk for complications from amniocentesis, including spontaneous pregnancy loss. We discussed the possible results that the tests might provide including: positive, negative, unanticipated, and no result. Finally, they were counseled regarding the cost of each option and potential out of pocket expenses. After consideration of all the options, she elected to proceed with NIPS [facility]  Those results will be available in 8-10 days.  She declined diagnostic, invasive testing for chromosome conditions.   A nuchal translucency ultrasound was performed today.  The report will be documented separately.  The patient would like to return for a detailed ultrasound at ~40+ weeks gestation.  This appointment was scheduled today. She understands that screening tests cannot rule out all birth defects or genetic syndromes. The patient was advised of this limitation and states she still does not want additional testing at this time.    Ms. Megan Howdenas provided with written information regarding cystic fibrosis (CF), spinal muscular atrophy (SMA) and hemoglobinopathies including the carrier frequency, availability of carrier screening and prenatal diagnosis if indicated.  In addition, we discussed that CF and hemoglobinopathies are routinely screened for as part of the  newborn screening panel.  After further discussion, she declined screening for CF, SMA and hemoglobinopathies.  Both family histories were reviewed and found to be noncontributory for birth defects, intellectual disability, recurrent pregnancy loss, and known genetic conditions. Consanguinity was denied.  Without further information  regarding the provided family history, an accurate genetic risk cannot be calculated. Further genetic counseling is warranted if more information  is obtained.  Ms. Megan Colon denied exposure to environmental toxins or chemical agents. She denied the use of alcohol, tobacco or street drugs. She denied significant viral illnesses during the course of her pregnancy. Her medical and surgical histories were noncontributory.   I counseled this couple regarding the above risks and available options.  The approximate face-to-face time with the genetic counselor was 45 minutes.  Chipper Oman, MS,  Certified Genetic Counselor 11/06/2016

## 2016-11-10 ENCOUNTER — Ambulatory Visit (INDEPENDENT_AMBULATORY_CARE_PROVIDER_SITE_OTHER): Payer: Medicaid Other | Admitting: Obstetrics

## 2016-11-10 ENCOUNTER — Encounter: Payer: Self-pay | Admitting: *Deleted

## 2016-11-10 ENCOUNTER — Encounter: Payer: Self-pay | Admitting: Obstetrics

## 2016-11-10 ENCOUNTER — Encounter: Payer: Medicaid Other | Admitting: Obstetrics

## 2016-11-10 VITALS — BP 121/89 | HR 89 | Wt 166.0 lb

## 2016-11-10 DIAGNOSIS — O09529 Supervision of elderly multigravida, unspecified trimester: Secondary | ICD-10-CM

## 2016-11-10 DIAGNOSIS — O09523 Supervision of elderly multigravida, third trimester: Secondary | ICD-10-CM

## 2016-11-10 NOTE — Progress Notes (Signed)
  Subjective:    Megan Colon is a 40 y.o. female being seen today for her obstetrical visit. She is at 21w1dgestation. Patient reports: no complaints.  Problem List Items Addressed This Visit    None     Patient Active Problem List   Diagnosis Date Noted  . Advanced maternal age in multigravida, unspecified trimester 11/06/2016    Objective:     BP 121/89   Pulse 89   Wt 166 lb (75.3 kg)   LMP 07/14/2016 (LMP Unknown)   BMI 32.42 kg/m  Uterine Size: Below umbilicus     Assessment:    Pregnancy @ 112w1dweeks Doing well    Plan:    Problem list reviewed and updated. Labs reviewed.  Follow up in 4 weeks. FIRST/CF mutation testing/NIPT/QUAD SCREEN/fragile X/Ashkenazi Jewish population testing/Spinal muscular atrophy discussed: requested. Role of ultrasound in pregnancy discussed; fetal survey: requested. Amniocentesis discussed: declined. 50% of 15 minute visit spent on counseling and coordination of care.

## 2016-11-10 NOTE — Progress Notes (Signed)
Patient ID: Megan Colon, female   DOB: 1976/04/08, 40 y.o.   MRN: [id number]

## 2016-11-13 ENCOUNTER — Telehealth (HOSPITAL_COMMUNITY): Payer: Self-pay | Admitting: MS"

## 2016-11-13 ENCOUNTER — Other Ambulatory Visit (HOSPITAL_COMMUNITY): Payer: Self-pay

## 2016-11-13 NOTE — Telephone Encounter (Signed)
Called Megan Colon to discuss her prenatal cell free DNA test results.  Mrs. Lauralei Clouse had Panorama testing through [facility].  Testing was offered because of maternal age.   The patient was identified by name and DOB.  We reviewed that these are within normal limits, showing a less than 1 in 10,000 risk for trisomies 21, 18 and 13, and monosomy X (Turner syndrome).  In addition, the risk for triploidy and sex chromosome trisomies (47,XXX and 47,XXY) was also low risk.  We reviewed that this testing identifies > 99% of pregnancies with trisomy 64, trisomy 40, sex chromosome trisomies (47,XXX and 47,XXY), and triploidy. The detection rate for trisomy 18 is 96%.  The detection rate for monosomy X is ~92%.  The false positive rate is <0.1% for all conditions. Testing was also consistent with female fetal sex.  The patient did wish to know fetal sex.  She understands that this testing does not identify all genetic conditions.  All questions were answered to her satisfaction, she was encouraged to call with additional questions or concerns.  Chipper Oman, MS Certified Genetic Counselor 11/13/2016 12:21 PM

## 2016-12-01 ENCOUNTER — Other Ambulatory Visit: Payer: Self-pay | Admitting: Obstetrics

## 2016-12-01 DIAGNOSIS — O219 Vomiting of pregnancy, unspecified: Secondary | ICD-10-CM

## 2016-12-01 MED ORDER — PROMETHAZINE HCL 25 MG PO TABS: 25.0000 mg | ORAL_TABLET | Freq: Four times a day (QID) | ORAL | 1 refills | Status: DC | PRN

## 2016-12-11 ENCOUNTER — Encounter: Payer: Medicaid Other | Admitting: Obstetrics

## 2016-12-18 ENCOUNTER — Ambulatory Visit (HOSPITAL_COMMUNITY)
Admission: RE | Admit: 2016-12-18 | Discharge: 2016-12-18 | Disposition: A | Discharge status: Home / Self Care | Payer: Medicaid Other | Source: Ambulatory Visit | Attending: Obstetrics | Admitting: Obstetrics

## 2016-12-18 ENCOUNTER — Encounter: Payer: Medicaid Other | Admitting: Obstetrics

## 2016-12-18 DIAGNOSIS — O09522 Supervision of elderly multigravida, second trimester: Secondary | ICD-10-CM | POA: Insufficient documentation

## 2016-12-18 DIAGNOSIS — Z3A18 18 weeks gestation of pregnancy: Secondary | ICD-10-CM | POA: Diagnosis not present

## 2016-12-18 DIAGNOSIS — Z363 Encounter for antenatal screening for malformations: Secondary | ICD-10-CM | POA: Diagnosis not present

## 2016-12-18 DIAGNOSIS — O09529 Supervision of elderly multigravida, unspecified trimester: Secondary | ICD-10-CM

## 2017-01-15 ENCOUNTER — Ambulatory Visit (INDEPENDENT_AMBULATORY_CARE_PROVIDER_SITE_OTHER): Payer: Medicaid Other | Admitting: Obstetrics

## 2017-01-15 ENCOUNTER — Encounter: Payer: Medicaid Other | Admitting: Obstetrics

## 2017-01-15 ENCOUNTER — Encounter: Payer: Self-pay | Admitting: Obstetrics

## 2017-01-15 VITALS — BP 120/67 | HR 114 | Temp 99.3°F | Wt 178.0 lb

## 2017-01-15 DIAGNOSIS — G44201 Tension-type headache, unspecified, intractable: Secondary | ICD-10-CM

## 2017-01-15 DIAGNOSIS — O09522 Supervision of elderly multigravida, second trimester: Secondary | ICD-10-CM

## 2017-01-15 DIAGNOSIS — O09529 Supervision of elderly multigravida, unspecified trimester: Secondary | ICD-10-CM

## 2017-01-15 DIAGNOSIS — Z3480 Encounter for supervision of other normal pregnancy, unspecified trimester: Secondary | ICD-10-CM

## 2017-01-15 MED ORDER — OXYCODONE HCL 10 MG PO TABS: 10.0000 mg | ORAL_TABLET | Freq: Four times a day (QID) | ORAL | 0 refills | Status: DC | PRN

## 2017-01-15 NOTE — Progress Notes (Signed)
Pt c/o Ha's. No relief with Fiorcet.

## 2017-01-15 NOTE — Progress Notes (Signed)
Subjective:  Megan Colon is a 41 y.o. [id number] at 42w4dbeing seen today for ongoing prenatal care.  She is currently monitored for the following issues for this low-risk pregnancy and has Advanced maternal age in multigravida, unspecified trimester on her problem list.  Patient reports headache.  Contractions: Irregular. Vag. Bleeding: None.  Movement: Present. Denies leaking of fluid.   The following portions of the patient's history were reviewed and updated as appropriate: allergies, current medications, past family history, past medical history, past social history, past surgical history and problem list. Problem list updated.  Objective:   Vitals:   01/15/17 0955  BP: 120/67  Pulse: (!) 114  Temp: 99.3 F (37.4 C)  Weight: 178 lb (80.7 kg)    Fetal Status: Fetal Heart Rate (bpm): 150   Movement: Present     General:  Alert, oriented and cooperative. Patient is in no acute distress.  Skin: Skin is warm and dry. No rash noted.   Cardiovascular: Normal heart rate noted  Respiratory: Normal respiratory effort, no problems with respiration noted  Abdomen: Soft, gravid, appropriate for gestational age. Pain/Pressure: Absent     Pelvic:  Cervical exam deferred        Extremities: Normal range of motion.  Edema: None  Mental Status: Normal mood and affect. Normal behavior. Normal judgment and thought content.   Urinalysis:      Assessment and Plan:  Pregnancy: [id number] 267w4d1. Advanced maternal age in multigravida, unspecified trimester 2. Migraine Headache - Oxycodone Rx.  [facility] no relief with [facility]xycodone.  Preterm labor symptoms and general obstetric precautions including but not limited to vaginal bleeding, contractions, leaking of fluid and fetal movement were reviewed in detail with the patient. Please refer to After Visit Summary for other counseling recommendations.  Return in 4 weeks (on 02/12/2017).   ChShelly Bombard MDPatient ID: HiLura Emfemale   DOB: [birth year].o.   MRN: [id number]

## 2017-02-12 ENCOUNTER — Ambulatory Visit (INDEPENDENT_AMBULATORY_CARE_PROVIDER_SITE_OTHER): Payer: Medicaid Other | Admitting: Obstetrics

## 2017-02-12 ENCOUNTER — Encounter: Payer: Self-pay | Admitting: Obstetrics

## 2017-02-12 VITALS — BP 124/86 | HR 112 | Wt 182.0 lb

## 2017-02-12 DIAGNOSIS — L0292 Furuncle, unspecified: Secondary | ICD-10-CM

## 2017-02-12 DIAGNOSIS — Z3480 Encounter for supervision of other normal pregnancy, unspecified trimester: Secondary | ICD-10-CM

## 2017-02-12 DIAGNOSIS — Z3482 Encounter for supervision of other normal pregnancy, second trimester: Secondary | ICD-10-CM

## 2017-02-12 MED ORDER — CLINDAMYCIN HCL 300 MG PO CAPS: 300.0000 mg | ORAL_CAPSULE | Freq: Three times a day (TID) | ORAL | 0 refills | Status: DC

## 2017-02-12 NOTE — Progress Notes (Signed)
Patient ID: Megan Colon, female   DOB: Jul 09, 1976, 41 y.o.   MRN: [id number]

## 2017-02-12 NOTE — Progress Notes (Signed)
Pt states that she may have an infected follicle, needs exam today.

## 2017-02-12 NOTE — Progress Notes (Signed)
Subjective:  Megan Colon is a 41 y.o. [id number] at 23w4dbeing seen today for ongoing prenatal care.  She is currently monitored for the following issues for this low-risk pregnancy and has Advanced maternal age in multigravida, unspecified trimester on her problem list.  Patient reports boil on left inner thigh near buttock.  Contractions: Not present. Vag. Bleeding: None.  Movement: Present. Denies leaking of fluid.   The following portions of the patient's history were reviewed and updated as appropriate: allergies, current medications, past family history, past medical history, past social history, past surgical history and problem list. Problem list updated.  Objective:   Vitals:   02/12/17 0943  BP: 124/86  Pulse: (!) 112  Weight: 182 lb (82.6 kg)    Fetal Status:     Movement: Present     General:  Alert, oriented and cooperative. Patient is in no acute distress.  Skin: Skin is warm and dry. No rash noted.   Cardiovascular: Normal heart rate noted  Respiratory: Normal respiratory effort, no problems with respiration noted  Abdomen: Soft, gravid, appropriate for gestational age. Pain/Pressure: Present     Pelvic:  Cervical exam deferred        Extremities: Normal range of motion.     Mental Status: Normal mood and affect. Normal behavior. Normal judgment and thought content.   Urinalysis: Urine Protein: Negative Urine Glucose: Negative  Assessment and Plan:  Pregnancy: [id number] 254w4d1. Boil Rx: - clindamycin (CLEOCIN) 300 MG capsule; Take 1 capsule (300 mg total) by mouth 3 (three) times daily.  Dispense: 21 capsule; Refill: 0  Preterm labor symptoms and general obstetric precautions including but not limited to vaginal bleeding, contractions, leaking of fluid and fetal movement were reviewed in detail with the patient. Please refer to After Visit Summary for other counseling recommendations.  No Follow-up on file.   ChShelly BombardMD

## 2017-02-26 ENCOUNTER — Encounter: Payer: Self-pay | Admitting: *Deleted

## 2017-02-26 ENCOUNTER — Other Ambulatory Visit: Payer: Medicaid Other

## 2017-02-26 ENCOUNTER — Encounter: Payer: Self-pay | Admitting: Obstetrics

## 2017-02-26 ENCOUNTER — Ambulatory Visit (INDEPENDENT_AMBULATORY_CARE_PROVIDER_SITE_OTHER): Payer: Medicaid Other | Admitting: Obstetrics

## 2017-02-26 VITALS — BP 127/83 | HR 108 | Wt 185.0 lb

## 2017-02-26 DIAGNOSIS — O09529 Supervision of elderly multigravida, unspecified trimester: Secondary | ICD-10-CM

## 2017-02-26 DIAGNOSIS — Z23 Encounter for immunization: Secondary | ICD-10-CM

## 2017-02-26 DIAGNOSIS — O09523 Supervision of elderly multigravida, third trimester: Secondary | ICD-10-CM

## 2017-02-26 DIAGNOSIS — K219 Gastro-esophageal reflux disease without esophagitis: Secondary | ICD-10-CM

## 2017-02-26 MED ORDER — RANITIDINE HCL 150 MG PO TABS: 150.0000 mg | ORAL_TABLET | Freq: Two times a day (BID) | ORAL | 6 refills | Status: DC

## 2017-02-26 NOTE — Progress Notes (Signed)
ROB presents for 2 gtt labs and tdap today. Pt still c/o heartburn no relief with Zantac.

## 2017-02-27 LAB — CBC
HEMOGLOBIN: 12 g/dL (ref 11.1–15.9)
Hematocrit: 36.2 % (ref 34.0–46.6)
MCH: 30 pg (ref 26.6–33.0)
MCHC: 33.1 g/dL (ref 31.5–35.7)
MCV: 91 fL (ref 79–97)
Platelets: 182 10*3/uL (ref 150–379)
RBC: 4 x10E6/uL (ref 3.77–5.28)
RDW: 13.7 % (ref 12.3–15.4)
WBC: 12.1 10*3/uL — ABNORMAL HIGH (ref 3.4–10.8)

## 2017-02-27 LAB — HIV ANTIBODY (ROUTINE TESTING W REFLEX): HIV SCREEN 4TH GENERATION: NONREACTIVE

## 2017-02-27 LAB — GLUCOSE TOLERANCE, 2 HOURS W/ 1HR
GLUCOSE, 1 HOUR: 146 mg/dL (ref 65–179)
GLUCOSE, 2 HOUR: 136 mg/dL (ref 65–152)
Glucose, Fasting: 78 mg/dL (ref 65–91)

## 2017-02-27 LAB — RPR: RPR: NONREACTIVE

## 2017-02-28 NOTE — Progress Notes (Signed)
Subjective:  Megan Colon is a 41 y.o. [id number] at 57w6dbeing seen today for ongoing prenatal care.  She is currently monitored for the following issues for this low-risk pregnancy and has Advanced maternal age in multigravida, unspecified trimester on her problem list.  Patient reports heartburn.  Contractions: Not present. Vag. Bleeding: None.  Movement: Present. Denies leaking of fluid.   The following portions of the patient's history were reviewed and updated as appropriate: allergies, current medications, past family history, past medical history, past social history, past surgical history and problem list. Problem list updated.  Objective:   Vitals:   02/26/17 0946  BP: 127/83  Pulse: (!) 108  Weight: 185 lb (83.9 kg)    Fetal Status: Fetal Heart Rate (bpm): 150   Movement: Present     General:  Alert, oriented and cooperative. Patient is in no acute distress.  Skin: Skin is warm and dry. No rash noted.   Cardiovascular: Normal heart rate noted  Respiratory: Normal respiratory effort, no problems with respiration noted  Abdomen: Soft, gravid, appropriate for gestational age. Pain/Pressure: Present     Pelvic:  Cervical exam deferred        Extremities: Normal range of motion.  Edema: None  Mental Status: Normal mood and affect. Normal behavior. Normal judgment and thought content.   Urinalysis:      Assessment and Plan:  Pregnancy: [id number] 288w6d1. Advanced maternal age in multigravida, unspecified trimester Rx: - Glucose Tolerance, 2 Hours w/1 Hour - CBC - HIV antibody - RPR - Tdap vaccine greater than or equal to 7yo IM  2. GERD without esophagitis Rx: - ranitidine (ZANTAC) 150 MG tablet; Take 1 tablet (150 mg total) by mouth 2 (two) times daily.  Dispense: 60 tablet; Refill: 6  Preterm labor symptoms and general obstetric precautions including but not limited to vaginal bleeding, contractions, leaking of fluid and fetal movement were reviewed in detail with  the patient. Please refer to After Visit Summary for other counseling recommendations.  F/U in 2 weeks   ChShelly BombardMD  Patient ID: HiKelly Ranierifemale   DOB: 08/27/30/197740[birth year].o.   MRN: [id number]

## 2017-03-10 ENCOUNTER — Other Ambulatory Visit: Payer: Self-pay | Admitting: Obstetrics

## 2017-03-10 DIAGNOSIS — L0292 Furuncle, unspecified: Secondary | ICD-10-CM

## 2017-03-10 DIAGNOSIS — K219 Gastro-esophageal reflux disease without esophagitis: Secondary | ICD-10-CM

## 2017-03-10 DIAGNOSIS — G44009 Cluster headache syndrome, unspecified, not intractable: Secondary | ICD-10-CM

## 2017-03-10 MED ORDER — BUTALBITAL-APAP-CAFFEINE 50-325-40 MG PO TABS: 2.0000 | ORAL_TABLET | Freq: Four times a day (QID) | ORAL | 2 refills | Status: DC | PRN

## 2017-03-10 MED ORDER — RANITIDINE HCL 150 MG PO TABS: 150.0000 mg | ORAL_TABLET | Freq: Two times a day (BID) | ORAL | 11 refills | Status: DC

## 2017-03-12 ENCOUNTER — Encounter: Payer: Self-pay | Admitting: Obstetrics

## 2017-03-12 ENCOUNTER — Other Ambulatory Visit (HOSPITAL_COMMUNITY)
Admission: RE | Admit: 2017-03-12 | Discharge: 2017-03-12 | Disposition: A | Discharge status: Home / Self Care | Payer: Medicaid Other | Source: Ambulatory Visit | Attending: Obstetrics | Admitting: Obstetrics

## 2017-03-12 ENCOUNTER — Encounter (HOSPITAL_COMMUNITY): Payer: Self-pay | Admitting: *Deleted

## 2017-03-12 ENCOUNTER — Inpatient Hospital Stay (HOSPITAL_COMMUNITY)
Admission: AD | Admit: 2017-03-12 | Discharge: 2017-03-12 | Disposition: A | Discharge status: Home / Self Care | Payer: Medicaid Other | Source: Ambulatory Visit | Attending: Obstetrics & Gynecology | Admitting: Obstetrics & Gynecology

## 2017-03-12 ENCOUNTER — Ambulatory Visit (INDEPENDENT_AMBULATORY_CARE_PROVIDER_SITE_OTHER): Payer: Medicaid Other | Admitting: Obstetrics

## 2017-03-12 VITALS — BP 123/81 | HR 114 | Wt 188.0 lb

## 2017-03-12 DIAGNOSIS — Z87891 Personal history of nicotine dependence: Secondary | ICD-10-CM | POA: Diagnosis not present

## 2017-03-12 DIAGNOSIS — O09523 Supervision of elderly multigravida, third trimester: Secondary | ICD-10-CM | POA: Insufficient documentation

## 2017-03-12 DIAGNOSIS — Z3A30 30 weeks gestation of pregnancy: Secondary | ICD-10-CM | POA: Insufficient documentation

## 2017-03-12 DIAGNOSIS — R197 Diarrhea, unspecified: Secondary | ICD-10-CM | POA: Diagnosis not present

## 2017-03-12 DIAGNOSIS — O09529 Supervision of elderly multigravida, unspecified trimester: Secondary | ICD-10-CM

## 2017-03-12 DIAGNOSIS — Z79899 Other long term (current) drug therapy: Secondary | ICD-10-CM | POA: Diagnosis not present

## 2017-03-12 DIAGNOSIS — O9989 Other specified diseases and conditions complicating pregnancy, childbirth and the puerperium: Secondary | ICD-10-CM

## 2017-03-12 LAB — GASTROINTESTINAL PANEL BY PCR, STOOL (REPLACES STOOL CULTURE)

## 2017-03-12 LAB — POCT URINALYSIS DIPSTICK
Bilirubin, UA: NEGATIVE
Glucose, UA: NEGATIVE
Ketones, UA: NEGATIVE
Leukocytes, UA: NEGATIVE
Nitrite, UA: NEGATIVE
PH UA: 6 (ref 5.0–8.0)
Protein, UA: NEGATIVE
Spec Grav, UA: 1.02 (ref 1.030–1.035)
UROBILINOGEN UA: 0.2 (ref ?–2.0)

## 2017-03-12 LAB — C DIFFICILE QUICK SCREEN W PCR REFLEX
C DIFFICLE (CDIFF) ANTIGEN: POSITIVE — AB
C Diff toxin: NEGATIVE

## 2017-03-12 LAB — CLOSTRIDIUM DIFFICILE BY PCR: Toxigenic C. Difficile by PCR: POSITIVE — AB

## 2017-03-12 NOTE — Progress Notes (Signed)
Pt c/o pubic pain since yesterday. Denies VB and abnormal vag. Discharge. Pt c/o diarrhea x 2 wks.

## 2017-03-12 NOTE — MAU Provider Note (Signed)
Chief Complaint:  Diarrhea   First Provider Initiated Contact with Patient 03/12/17 1120      HPI: Megan Colon is a 41 y.o. [id number] at 28w4dwho presents to maternity admissions reporting diarrhea.  Dr. HJodi Mourningsent the patient in from the office, due to persistent diarrhea for 2 weeks, and concerned for C. Diff, requesting to have a stool culture performed and C. Diff culture sent. Patient was recently on a course of Clindamycin for skin infection. Per patient, her diarrhea started before beginning Clindamycin treatment. She has had for the past 2 weeks diarrhea described as loose stools with occasional watery stools, occurring 4 times daily. No nausea or vomiting. Mild abdominal cramping before diarrhea, then resolves after BM. No bloody stools or melena. No other sick contacts in the household.   Denies contractions, leakage of fluid or vaginal bleeding. Good fetal movement.   Pregnancy Course:   Past Medical History: Past Medical History:  Diagnosis Date  . Medical history non-contributory     Past obstetric history: OB History  Gravida Para Term Preterm AB Living  '6 2 2   3 2  '$ SAB TAB Ectopic Multiple Live Births    3     2    # Outcome Date GA Lbr Len/2nd Weight Sex Delivery Anes PTL Lv  6 Current           5 Term           4 Term           3 TAB           2 TAB           1 TAB               Past Surgical History: Past Surgical History:  Procedure Laterality Date  . NO PAST SURGERIES       Family History: Family History  Problem Relation Age of Onset  . Hypertension Mother   . Diabetes Father     Social History: Social History  Substance Use Topics  . Smoking status: Former Smoker    Quit date: 06/17/2016  . Smokeless tobacco: Never Used  . Alcohol use No    Allergies:  Allergies  Allergen Reactions  . Oxycodone Hcl Rash    Meds:  Prescriptions Prior to Admission  Medication Sig Dispense Refill Last Dose  . acetaminophen (TYLENOL) 500 MG tablet  Take 2 tablets (1,000 mg total) by mouth every 6 (six) hours as needed. 30 tablet 0 Past Week at Unknown time  . Prenat-FeCbn-FeAspGl-FA-Omega (OB COMPLETE PETITE) 35-5-1-200 MG CAPS Take 1 capsule by mouth daily before breakfast. 90 capsule 3 03/11/2017 at Unknown time  . promethazine (PHENERGAN) 25 MG tablet Take 1 tablet (25 mg total) by mouth every 6 (six) hours as needed for nausea or vomiting. 30 tablet 1 Past Week at Unknown time  . butalbital-acetaminophen-caffeine (FIORICET, ESGIC) 50-325-40 MG tablet Take 2 tablets by mouth every 6 (six) hours as needed for headache. 30 tablet 2 Taking  . clindamycin (CLEOCIN) 300 MG capsule Take 1 capsule (300 mg total) by mouth 3 (three) times daily. (Patient not taking: Reported on 03/12/2017) 21 capsule 0 Completed Course at Unknown time  . ranitidine (ZANTAC) 150 MG tablet Take 1 tablet (150 mg total) by mouth 2 (two) times daily. 60 tablet 11 Taking    I have reviewed patient's Past Medical Hx, Surgical Hx, Family Hx, Social Hx, medications and allergies.   ROS:  A comprehensive ROS  was negative except per HPI.    Physical Exam  Patient Vitals for the past 24 hrs:  BP Temp Temp src Pulse Resp SpO2 Height Weight  03/12/17 1040 135/78 99.1 F (37.3 C) Oral (!) 107 18 99 % '5\' 2"'$  (1.575 m) 189 lb 0.6 oz (85.7 kg)   Constitutional: Well-developed, well-nourished female in no acute distress.  Cardiovascular: normal rate, rhythm, no murmurs noted. Respiratory: normal effort, CTAB GI: Abd soft, non-tender, gravid appropriate for gestational age. Pos BS x 4 MS: Extremities nontender, no edema, normal ROM Neurologic: Alert and oriented x 4.  GU: Neg CVAT.   Labs: Results for orders placed or performed in visit on 03/12/17 (from the past 24 hour(s))  POCT urinalysis dipstick     Status: None   Collection Time: 03/12/17  9:02 AM  Result Value Ref Range   Color, UA yellow    Clarity, UA cloudy    Glucose, UA neg    Bilirubin, UA neg    Ketones,  UA neg    Spec Grav, UA 1.020 1.030 - 1.035   Blood, UA small    pH, UA 6.0 5.0 - 8.0   Protein, UA neg    Urobilinogen, UA 0.2 Negative - 2.0   Nitrite, UA neg    Leukocytes, UA Negative Negative    Imaging:  No results found.  MAU Course: C. Diff cultures - pending Stool cultures - pending VSS NST - reactive   I personally reviewed the patient's NST today, found to be REACTIVE. 140 bpm, mod var, +accels, no decels. CTX: NONE.   MDM: Plan of care reviewed with patient, including labs and tests ordered and medical treatment. Discussed will follow up on culture results and call patient with plan of care if positive. Supportive care. Stable for discharge.   Assessment: 1. Diarrhea, unspecified type     Plan: Discharge home in stable condition.  Preterm Labor precautions and fetal kick counts Follow up stool cultures/C Diff    Allergies as of 03/12/2017      Reactions   Oxycodone Hcl Rash      Medication List    STOP taking these medications   clindamycin 300 MG capsule Commonly known as:  CLEOCIN     TAKE these medications   acetaminophen 500 MG tablet Commonly known as:  TYLENOL Take 2 tablets (1,000 mg total) by mouth every 6 (six) hours as needed.   butalbital-acetaminophen-caffeine 50-325-40 MG tablet Commonly known as:  FIORICET, ESGIC Take 2 tablets by mouth every 6 (six) hours as needed for headache.   OB COMPLETE PETITE 35-5-1-200 MG Caps Take 1 capsule by mouth daily before breakfast.   promethazine 25 MG tablet Commonly known as:  PHENERGAN Take 1 tablet (25 mg total) by mouth every 6 (six) hours as needed for nausea or vomiting.   ranitidine 150 MG tablet Commonly known as:  ZANTAC Take 1 tablet (150 mg total) by mouth 2 (two) times daily.       Katherine Basset, DO OB Fellow Center for [facility], [facility] 03/12/2017 11:31 AM

## 2017-03-12 NOTE — Addendum Note (Signed)
Addended by: Maryruth Eve on: 03/12/2017 10:33 AM   Modules accepted: Orders

## 2017-03-12 NOTE — Discharge Instructions (Signed)
Diarrhea, Adult Diarrhea is when you have loose and water poop (stool) often. Diarrhea can make you feel weak and cause you to get dehydrated. Dehydration can make you tired and thirsty, make you have a dry mouth, and make it so you pee (urinate) less often. Diarrhea often lasts 2-3 days. However, it can last longer if it is a sign of something more serious. It is important to treat your diarrhea as told by your doctor. Follow these instructions at home: Eating and drinking   Follow these recommendations as told by your doctor:  Take an oral rehydration solution (ORS). This is a drink that is sold at pharmacies and stores.  Drink clear fluids, such as:  Water.  Ice chips.  Diluted fruit juice.  Low-calorie sports drinks.  Eat bland, easy-to-digest foods in small amounts as you are able. These foods include:  Bananas.  Applesauce.  Rice.  Low-fat (lean) meats.  Toast.  Crackers.  Avoid drinking fluids that have a lot of sugar or caffeine in them.  Avoid alcohol.  Avoid spicy or fatty foods. General instructions    Drink enough fluid to keep your pee (urine) clear or pale yellow.  Wash your hands often. If you cannot use soap and water, use hand sanitizer.  Make sure that all people in your home wash their hands well and often.  Take over-the-counter and prescription medicines only as told by your doctor.  Rest at home while you get better.  Watch your condition for any changes.  Take a warm bath to help with any burning or pain from having diarrhea.  Keep all follow-up visits as told by your doctor. This is important. Contact a doctor if:  You have a fever.  Your diarrhea gets worse.  You have new symptoms.  You cannot keep fluids down.  You feel light-headed or dizzy.  You have a headache.  You have muscle cramps. Get help right away if:  You have chest pain.  You feel very weak or you pass out (faint).  You have bloody or black poop or  poop that look like tar.  You have very bad pain, cramping, or bloating in your belly (abdomen).  You have trouble breathing or you are breathing very quickly.  Your heart is beating very quickly.  Your skin feels cold and clammy.  You feel confused.  You have signs of dehydration, such as:  Dark pee, hardly any pee, or no pee.  Cracked lips.  Dry mouth.  Sunken eyes.  Sleepiness.  Weakness. This information is not intended to replace advice given to you by your health care provider. Make sure you discuss any questions you have with your health care provider. Document Released: 05/25/2008 Document Revised: 06/26/2016 Document Reviewed: 08/13/2015 Elsevier Interactive Patient Education  2017 [place] Choices to Help Relieve Diarrhea, Adult When you have diarrhea, the foods you eat and your eating habits are very important. Choosing the right foods and drinks can help:  Relieve diarrhea.  Replace lost fluids and nutrients.  Prevent dehydration. What general guidelines should I follow? Relieving diarrhea   Choose foods with less than 2 g or .07 oz. of fiber per serving.  Limit fats to less than 8 tsp (38 g or 1.34 oz.) a day.  Avoid the following:  Foods and beverages sweetened with high-fructose corn syrup, honey, or sugar alcohols such as xylitol, sorbitol, and mannitol.  Foods that contain a lot of fat or sugar.  Fried, greasy, or  spicy foods.  High-fiber grains, breads, and cereals.  Raw fruits and vegetables.  Eat foods that are rich in probiotics. These foods include dairy products such as yogurt and fermented milk products. They help increase healthy bacteria in the stomach and intestines (gastrointestinal tract, or GI tract).  If you have lactose intolerance, avoid dairy products. These may make your diarrhea worse.  Take medicine to help stop diarrhea (antidiarrheal medicine) only as told by your health care provider. Replacing  nutrients   Eat small meals or snacks every 3-4 hours.  Eat bland foods, such as white rice, toast, or baked potato, until your diarrhea starts to get better. Gradually reintroduce nutrient-rich foods as tolerated or as told by your health care provider. This includes:  Well-cooked protein foods.  Peeled, seeded, and soft-cooked fruits and vegetables.  Low-fat dairy products.  Take vitamin and mineral supplements as told by your health care provider. Preventing dehydration    Start by sipping water or a special solution to prevent dehydration (oral rehydration solution, ORS). Urine that is clear or pale yellow means that you are getting enough fluid.  Try to drink at least 8-10 cups of fluid each day to help replace lost fluids.  You may add other liquids in addition to water, such as clear juice or decaffeinated sports drinks, as tolerated or as told by your health care provider.  Avoid drinks with caffeine, such as coffee, tea, or soft drinks.  Avoid alcohol. What foods are recommended? The items listed may not be a complete list. Talk with your health care provider about what dietary choices are best for you. Grains  White rice. White, [place], or pita breads (fresh or toasted), including plain rolls, buns, or bagels. White pasta. Saltine, soda, or graham crackers. Pretzels. Low-fiber cereal. Cooked cereals made with water (such as cornmeal, farina, or cream cereals). Plain muffins. Matzo. Melba toast. Zwieback. Vegetables  Potatoes (without the skin). Most well-cooked and canned vegetables without skins or seeds. Tender lettuce. Fruits  Apple sauce. Fruits canned in juice. Cooked apricots, cherries, grapefruit, peaches, pears, or plums. Fresh bananas and cantaloupe. Meats and other protein foods  Baked or boiled chicken. Eggs. Tofu. Fish. Seafood. Smooth nut butters. Ground or well-cooked tender beef, ham, veal, lamb, pork, or poultry. Dairy  Plain yogurt, kefir, and  unsweetened liquid yogurt. Lactose-free milk, buttermilk, skim milk, or soy milk. Low-fat or nonfat hard cheese. Beverages  Water. Low-calorie sports drinks. Fruit juices without pulp. Strained tomato and vegetable juices. Decaffeinated teas. Sugar-free beverages not sweetened with sugar alcohols. Oral rehydration solutions, if approved by your health care provider. Seasoning and other foods  Bouillon, broth, or soups made from recommended foods. What foods are not recommended? The items listed may not be a complete list. Talk with your health care provider about what dietary choices are best for you. Grains  Whole grain, whole wheat, bran, or rye breads, rolls, pastas, and crackers. Wild or brown rice. Whole grain or bran cereals. Barley. Oats and oatmeal. Corn tortillas or taco shells. Granola. Popcorn. Vegetables  Raw vegetables. Fried vegetables. Cabbage, broccoli, Brussels sprouts, artichokes, baked beans, beet greens, corn, kale, legumes, peas, sweet potatoes, and yams. Potato skins. Cooked spinach and cabbage. Fruits  Dried fruit, including raisins and dates. Raw fruits. Stewed or dried prunes. Canned fruits with syrup. Meat and other protein foods  Fried or fatty meats. Deli meats. Chunky nut butters. Nuts and seeds. Beans and lentils. Berniece Salines. Hot dogs. Sausage. Dairy  High-fat cheeses. Whole milk, chocolate  milk, and beverages made with milk, such as milk shakes. Half-and-half. Cream. sour cream. Ice cream. Beverages  Caffeinated beverages (such as coffee, tea, soda, or energy drinks). Alcoholic beverages. Fruit juices with pulp. Prune juice. Soft drinks sweetened with high-fructose corn syrup or sugar alcohols. High-calorie sports drinks. Fats and oils  Butter. Cream sauces. Margarine. Salad oils. Plain salad dressings. Olives. Avocados. Mayonnaise. Sweets and desserts  Sweet rolls, doughnuts, and sweet breads. Sugar-free desserts sweetened with sugar alcohols such as xylitol and  sorbitol. Seasoning and other foods  Honey. Hot sauce. Chili powder. Gravy. Cream-based or milk-based soups. Pancakes and waffles. Summary  When you have diarrhea, the foods you eat and your eating habits are very important.  Make sure you get at least 8-10 cups of fluid each day, or enough to keep your urine clear or pale yellow.  Eat bland foods and gradually reintroduce healthy, nutrient-rich foods as tolerated, or as told by your health care provider.  Avoid high-fiber, fried, greasy, or spicy foods. This information is not intended to replace advice given to you by your health care provider. Make sure you discuss any questions you have with your health care provider. Document Released: 02/27/2004 Document Revised: 12/04/2016 Document Reviewed: 12/04/2016 Elsevier Interactive Patient Education  2017 [place] PREG[place]NCY  Acne:  Benzoyl Peroxide  Salicylic Acid   Backache/Headache:  Tylenol: 2 regular strength every 4 hours OR        2 Extra strength every 6 hours   Colds/Coughs/Allergies:  Benadryl (alcohol free) 25 mg every 6 hours as needed  Breath right strips  Claritin  Cepacol throat lozenges  Chloraseptic throat spray  Cold-Eeze- up to three times per day  Cough drops, alcohol free  Flonase (by prescription only)  Guaifenesin  Mucinex  Robitussin DM (plain only, alcohol free)  Saline nasal spray/drops  Sudafed (pseudoephedrine) & Actifed * use only after [redacted] weeks gestation and if you do not have high blood pressure  Tylenol  Vicks Vaporub  Zinc lozenges  Zyrtec   Constipation:  Colace  Ducolax suppositories  Fleet enema  Glycerin suppositories  Metamucil  Milk of magnesia  Miralax  Senokot  Smooth move tea   Diarrhea:  Kaopectate  Imodium A-D   *NO pepto Bismol   Hemorrhoids:  Anusol  Anusol HC  Preparation H  Tucks   Indigestion:  Tums  Maalox  Mylanta  Zantac  Pepcid   Insomnia:  Benadryl (alcohol  free) '25mg'$  every 6 hours as needed  Tylenol PM  Unisom, no Gelcaps   Leg Cramps:  Tums  MagGel   Nausea/Vomiting:  Bonine  Dramamine  Emetrol  Ginger extract  Sea bands  Meclizine  Nausea medication to take during pregnancy:  Unisom (doxylamine succinate 25 mg tablets) Take one tablet daily at bedtime. If symptoms are not adequately controlled, the dose can be increased to a maximum recommended dose of two tablets daily (1/2 tablet in the morning, 1/2 tablet mid-afternoon and one at bedtime).  Vitamin B6 '100mg'$  tablets. Take one tablet twice a day (up to 200 mg per day).   Skin Rashes:  Aveeno products  Benadryl cream or '25mg'$  every 6 hours as needed  Calamine Lotion  1% cortisone cream   Yeast infection:  Gyne-lotrimin 7  Monistat 7    **If taking multiple medications, please check labels to avoid duplicating the same active ingredients  **take medication as directed on the label  ** Do not exceed 4000 mg of tylenol in  24 hours  **Do not take medications that contain aspirin or ibuprofen

## 2017-03-12 NOTE — MAU Note (Signed)
Patient states she was given antibiotic 2 weeks ago and ever since she has been having daily diarrhea. Lower abdominal cramping 10/10 that started 2 days ago sharp and cramping in nature. Denies LOF, VB or contractions at this time. +FM

## 2017-03-12 NOTE — Progress Notes (Signed)
Subjective:  Megan Colon is a 41 y.o. [id number] at 63w4dbeing seen today for ongoing prenatal care.  She is currently monitored for the following issues for this low-risk pregnancy and has Advanced maternal age in multigravida, unspecified trimester on her problem list.  Patient reports diarrhea for 2 weeks after taking Clindamycin.  Contractions: Not present. Vag. Bleeding: None.  Movement: Present. Denies leaking of fluid.   The following portions of the patient's history were reviewed and updated as appropriate: allergies, current medications, past family history, past medical history, past social history, past surgical history and problem list. Problem list updated.  Objective:   Vitals:   03/12/17 0852  BP: 123/81  Pulse: (!) 114  Weight: 188 lb (85.3 kg)    Fetal Status: Fetal Heart Rate (bpm): 150   Movement: Present     General:  Alert, oriented and cooperative. Patient is in no acute distress.  Skin: Skin is warm and dry. No rash noted.   Cardiovascular: Normal heart rate noted  Respiratory: Normal respiratory effort, no problems with respiration noted  Abdomen: Soft, gravid, appropriate for gestational age. Pain/Pressure: Present     Pelvic:  Cervical exam performed      Long / Closed / -3 / Vtx  Extremities: Normal range of motion.  Edema: None  Mental Status: Normal mood and affect. Normal behavior. Normal judgment and thought content.   Urinalysis: Urine Protein: Negative Urine Glucose: Negative  Assessment and Plan:  Pregnancy: [id number] 34w4d1. Advanced maternal age in multigravida, unspecified trimester   2. Diarrhea, unspecified type Rx: - POCT urinalysis dipstick - R/O C. Difficile.  [facility] for O&P, Gram Stain, and C. Diff. Toxin.  Preterm labor symptoms and general obstetric precautions including but not limited to vaginal bleeding, contractions, leaking of fluid and fetal movement were reviewed in detail  with the patient. Please refer to After Visit Summary for other counseling recommendations.  Return in 2 weeks (on 03/26/2017).   ChShelly BombardMDPatient ID: HiLura Emfemale   DOB: [birth year]7740[birth year].o.   MRN: [id number]

## 2017-03-14 ENCOUNTER — Other Ambulatory Visit: Payer: Self-pay | Admitting: Family Medicine

## 2017-03-14 ENCOUNTER — Telehealth (HOSPITAL_COMMUNITY): Payer: Self-pay | Admitting: *Deleted

## 2017-03-14 ENCOUNTER — Encounter: Payer: Self-pay | Admitting: Family Medicine

## 2017-03-14 DIAGNOSIS — A0472 Enterocolitis due to Clostridium difficile, not specified as recurrent: Secondary | ICD-10-CM | POA: Insufficient documentation

## 2017-03-14 MED ORDER — VANCOMYCIN 50 MG/ML ORAL SOLUTION: 125.0000 mg | Freq: Four times a day (QID) | ORAL | 0 refills | Status: DC

## 2017-03-14 NOTE — Progress Notes (Signed)
Attempted to call patient, left VM to return call. Test results +C. Diff, and due to symptomatic, will treat. Sent Rx for Vancomycin PO '125mg'$  four times daily for 10 days. Will try again later or asked patient to call MAU back for results.  Zenda Alpers, DO  OB Fellow Center for [facility], [facility]

## 2017-03-14 NOTE — Telephone Encounter (Signed)
Patient called regarding stool test results. Per Dr. Raeford Razor note patient informed of positive C-Diff results and that Vancomycin was prescribed by physician and already sent to the pharmacy.  Patient educated on enteric precautions. Patient verbalized understanding.

## 2017-03-15 ENCOUNTER — Telehealth (HOSPITAL_COMMUNITY): Payer: Self-pay

## 2017-03-15 ENCOUNTER — Other Ambulatory Visit: Payer: Self-pay | Admitting: Student

## 2017-03-15 DIAGNOSIS — A0472 Enterocolitis due to Clostridium difficile, not specified as recurrent: Secondary | ICD-10-CM

## 2017-03-15 MED ORDER — VANCOMYCIN HCL 125 MG PO CAPS: 125.0000 mg | ORAL_CAPSULE | Freq: Four times a day (QID) | ORAL | 0 refills | Status: DC

## 2017-03-15 NOTE — Telephone Encounter (Signed)
Pt called stating the medicine that dr Vanetta Shawl sent to her pharmacy was not there and that they had never received an order for the medicine. Took down pt info for providers to contact her once they look at her chart.

## 2017-03-15 NOTE — Progress Notes (Signed)
Verified identity by name & DOB.  Pt states pharmacy didn't have her vancomyin prescription. After reviewing pt's chart, it appears that prescription was printed & was unable to be sent electronically. I called the pt's pharmacy for telephone order -- they are out of stock of oral vanc.  S/w patient who is ok taking pills & would prefer rx to go to [facility] on [address]. Rx sent electronically for oral vanc capsules to updated pharmacy.  Patient had no further questions.

## 2017-03-18 ENCOUNTER — Encounter (HOSPITAL_COMMUNITY): Payer: Self-pay

## 2017-03-18 ENCOUNTER — Inpatient Hospital Stay (HOSPITAL_COMMUNITY)
Admission: AD | Admit: 2017-03-18 | Discharge: 2017-03-19 | DRG: 781 | Disposition: A | Discharge status: Home / Self Care | Payer: Medicaid Other | Source: Ambulatory Visit | Attending: Obstetrics and Gynecology | Admitting: Obstetrics and Gynecology

## 2017-03-18 DIAGNOSIS — F17210 Nicotine dependence, cigarettes, uncomplicated: Secondary | ICD-10-CM | POA: Diagnosis present

## 2017-03-18 DIAGNOSIS — O99613 Diseases of the digestive system complicating pregnancy, third trimester: Secondary | ICD-10-CM | POA: Diagnosis present

## 2017-03-18 DIAGNOSIS — Z3A31 31 weeks gestation of pregnancy: Secondary | ICD-10-CM | POA: Diagnosis not present

## 2017-03-18 DIAGNOSIS — O99333 Smoking (tobacco) complicating pregnancy, third trimester: Secondary | ICD-10-CM | POA: Diagnosis present

## 2017-03-18 DIAGNOSIS — O09523 Supervision of elderly multigravida, third trimester: Secondary | ICD-10-CM

## 2017-03-18 DIAGNOSIS — Z8249 Family history of ischemic heart disease and other diseases of the circulatory system: Secondary | ICD-10-CM | POA: Diagnosis not present

## 2017-03-18 DIAGNOSIS — Z833 Family history of diabetes mellitus: Secondary | ICD-10-CM | POA: Diagnosis not present

## 2017-03-18 DIAGNOSIS — A0472 Enterocolitis due to Clostridium difficile, not specified as recurrent: Secondary | ICD-10-CM | POA: Diagnosis present

## 2017-03-18 DIAGNOSIS — O219 Vomiting of pregnancy, unspecified: Secondary | ICD-10-CM

## 2017-03-18 LAB — CBC
HEMATOCRIT: 36.8 % (ref 36.0–46.0)
Hemoglobin: 12.8 g/dL (ref 12.0–15.0)
MCH: 31.1 pg (ref 26.0–34.0)
MCHC: 34.8 g/dL (ref 30.0–36.0)
MCV: 89.3 fL (ref 78.0–100.0)
Platelets: 227 10*3/uL (ref 150–400)
RBC: 4.12 MIL/uL (ref 3.87–5.11)
RDW: 13.8 % (ref 11.5–15.5)
WBC: 17.7 10*3/uL — AB (ref 4.0–10.5)

## 2017-03-18 LAB — CERVICOVAGINAL ANCILLARY ONLY
Bacterial vaginitis: NEGATIVE
Candida vaginitis: NEGATIVE
Chlamydia: NEGATIVE
Neisseria Gonorrhea: NEGATIVE
Trichomonas: NEGATIVE

## 2017-03-18 LAB — TYPE AND SCREEN
ABO/RH(D): A POS
Antibody Screen: NEGATIVE

## 2017-03-18 LAB — ABO/RH: ABO/RH(D): A POS

## 2017-03-18 MED ORDER — CALCIUM CARBONATE ANTACID 500 MG PO CHEW: 2.0000 | CHEWABLE_TABLET | ORAL | Status: DC | PRN

## 2017-03-18 MED ORDER — ACETAMINOPHEN 325 MG PO TABS: 650.0000 mg | ORAL_TABLET | ORAL | Status: DC | PRN

## 2017-03-18 MED ORDER — PRENATAL MULTIVITAMIN CH
1.0000 | ORAL_TABLET | Freq: Every day | ORAL | Status: DC
  Administered 2017-03-19: 1 via ORAL

## 2017-03-18 MED ORDER — PROMETHAZINE HCL 25 MG/ML IJ SOLN: 12.5000 mg | Freq: Four times a day (QID) | INTRAMUSCULAR | Status: DC | PRN

## 2017-03-18 MED ORDER — DOCUSATE SODIUM 100 MG PO CAPS
100.0000 mg | ORAL_CAPSULE | Freq: Every day | ORAL | Status: DC
  Filled 2017-03-18: qty 1

## 2017-03-18 MED ORDER — ZOLPIDEM TARTRATE 5 MG PO TABS: 5.0000 mg | ORAL_TABLET | Freq: Every evening | ORAL | Status: DC | PRN

## 2017-03-18 MED ORDER — SODIUM CHLORIDE 0.9 % IV SOLN
INTRAVENOUS | Status: DC
  Administered 2017-03-18 – 2017-03-19 (×2): via INTRAVENOUS

## 2017-03-18 MED ORDER — METRONIDAZOLE IN NACL 5-0.79 MG/ML-% IV SOLN
500.0000 mg | Freq: Three times a day (TID) | INTRAVENOUS | Status: DC
  Administered 2017-03-18 – 2017-03-19 (×3): 500 mg via INTRAVENOUS
  Filled 2017-03-18 (×4): qty 100

## 2017-03-18 MED ORDER — PRENATAL MULTIVITAMIN CH
1.0000 | ORAL_TABLET | Freq: Every day | ORAL | Status: DC
  Administered 2017-03-18: 1 via ORAL
  Filled 2017-03-18 (×2): qty 1

## 2017-03-18 MED ORDER — SODIUM CHLORIDE 0.9 % IV SOLN
25.0000 mg | INTRAVENOUS | Status: DC
  Administered 2017-03-18 – 2017-03-19 (×2): 25 mg via INTRAVENOUS
  Filled 2017-03-18 (×2): qty 1

## 2017-03-18 MED ORDER — VANCOMYCIN 50 MG/ML ORAL SOLUTION
125.0000 mg | Freq: Four times a day (QID) | ORAL | Status: DC
  Administered 2017-03-18 – 2017-03-19 (×2): 125 mg via ORAL
  Filled 2017-03-18 (×3): qty 2.5

## 2017-03-18 MED ORDER — FAMOTIDINE IN NACL 20-0.9 MG/50ML-% IV SOLN
20.0000 mg | Freq: Two times a day (BID) | INTRAVENOUS | Status: DC
  Administered 2017-03-18 – 2017-03-19 (×2): 20 mg via INTRAVENOUS
  Filled 2017-03-18 (×2): qty 50

## 2017-03-18 MED ORDER — ACETAMINOPHEN 325 MG PO TABS
650.0000 mg | ORAL_TABLET | ORAL | Status: DC | PRN
  Filled 2017-03-18: qty 2

## 2017-03-18 MED ORDER — ACETAMINOPHEN 325 MG PO TABS
650.0000 mg | ORAL_TABLET | Freq: Once | ORAL | Status: AC
  Administered 2017-03-18: 650 mg via ORAL

## 2017-03-18 NOTE — H&P (Signed)
Megan Colon is a 41 y.o. female [phone] at 44w3dpresenting for the treatment of C. Diff. Patient with a history of diarrhea since early March following the treatment of a skin infection with Clindamycin. Patient reports several episodes of loose watery stools per day (on average 4). She was diagnosed with C.Kizzie Furnishon 3/23 and received outpatient treatment with Vancomycin which was initiated on 3/27. Over the past day, patient has been experiencing nausea and 3 episodes of emesis and has not been able to take her antibiotics. She reports good fetal movement, irregular contractions. She denies vaginal bleeding or leakage of fluid. [facility] and recent C. Diff enteritis  OB History    Gravida Para Term Preterm AB Living   '6 2 2   3 2   '$ SAB TAB Ectopic Multiple Live Births     3     2     Past Medical History:  Diagnosis Date  . Medical history non-contributory    Past Surgical History:  Procedure Laterality Date  . LASIK Bilateral 2006  . NO PAST SURGERIES     Family History: family history includes Diabetes in her father; Hypertension in her mother. Social History:  reports that she has been smoking Cigarettes.  She has been smoking about 0.25 packs per day. She has never used smokeless tobacco. She reports that she does not drink alcohol or use drugs.     Maternal Diabetes: No Genetic Screening: Normal Maternal Ultrasounds/Referrals: Normal Fetal Ultrasounds or other Referrals:  None Maternal Substance Abuse:  No Significant Maternal Medications:  None Significant Maternal Lab Results:  None Other Comments:  None  ROS  See pertinent in HPI History   Blood pressure 116/64, pulse (!) 102, temperature 99.2 F (37.3 C), temperature source Oral, last menstrual period 07/14/2016. Exam Physical Exam  GENERAL: Well-developed, well-nourished female in no acute distress.  HEENT: Normocephalic, atraumatic. Sclerae  anicteric.  NECK: Supple. Normal thyroid.  LUNGS: Clear to auscultation bilaterally.  HEART: Regular rate and rhythm. ABDOMEN: Soft, nontender, gravid PELVIC: Cx closed/thick EXTREMITIES: No cyanosis, clubbing, or edema, 2+ distal pulses.  Prenatal labs: ABO, Rh: A/Positive/-- (10/18 1543) Antibody: Negative (10/18 1543) Rubella: 1.23 (10/18 1543) RPR: Non Reactive (03/09 1000)  HBsAg: Negative (10/18 1543)  HIV: Non Reactive (03/09 1000)  GBS:     Assessment/Plan: 41[id number] - Admit for antibiotics - IV Flagyl for now. Will switch to vancomycin oral when tolerating po - IV hydration - antiemetics - Close monitoring  Megan Colon 03/18/2017, 4:13 PM

## 2017-03-18 NOTE — Progress Notes (Signed)
Pt admitted to Room 311 with positive C-diff which patient states that she was diagnosed 03/12/17. Pt states that her last episode of diarrhea was this morning. Pt states that she feels like she's contracting and rates them 8/10. Pt denies leaking of fluid or vag bleeding. Pt placed on EFM. FHR 135. Positive fetal movement. [name], RN

## 2017-03-19 ENCOUNTER — Encounter (HOSPITAL_COMMUNITY): Payer: Self-pay | Admitting: *Deleted

## 2017-03-19 MED ORDER — VANCOMYCIN HCL 125 MG PO CAPS: 125.0000 mg | ORAL_CAPSULE | Freq: Four times a day (QID) | ORAL | 0 refills | Status: AC

## 2017-03-19 MED ORDER — PROMETHAZINE HCL 25 MG PO TABS: 25.0000 mg | ORAL_TABLET | Freq: Four times a day (QID) | ORAL | 3 refills | Status: DC | PRN

## 2017-03-19 MED ORDER — ACETAMINOPHEN 325 MG PO TABS: 650.0000 mg | ORAL_TABLET | ORAL | 3 refills | Status: DC | PRN

## 2017-03-19 NOTE — Progress Notes (Signed)
Went to hang pt's scheduled dose of Flagyl at 2300 as a piggy-back. Noticed the scheduled Pepcid that was due at 1800 was hung, but didn't get to the pt. Checked line and it was unclamped, but a kink in the tube was noted.

## 2017-03-19 NOTE — Progress Notes (Signed)
Marcene Brawn RN w/ID called to check on pt. Updated that pt is going home today. Enteric Precautions continued.

## 2017-03-19 NOTE — Progress Notes (Signed)
Fetal monitor 2nd observer, Eliezer Lofts RN prior to D/C

## 2017-03-19 NOTE — Discharge Summary (Addendum)
OB Discharge Summary     Patient Name: Megan Colon DOB: 09-11-1976 MRN: [id number]  Date of admission: 03/18/2017 Delivering MD: This patient has no babies on file.  Date of discharge: 03/19/2017  Admitting diagnosis: 31WKS, ABD PAIN,VOMITING Intrauterine pregnancy: [redacted]w[redacted]d    Secondary diagnosis:  Active Problems:   Clostridium difficile diarrhea  Discharge diagnosis: Third trimester pregnancy with C. Diff enteritis                                                                                                Hospital course:  Patient admitted secondary to persistent diarrhea following 1 day of antibiotics and inability to tolerate oral intake secondary to nausea/emesis. Patient was diagnosed with C.Diff enteritis on 3/23 and started treatment on 3/27. She received antiemetics and IV Flagyl in addition to po vancomycin. She reports 2 formed soft stools since admission and was able to have pizza and a salad for dinner yesterday evening. Discharge instructions were reviewed with the patient. Follow up as planned on 4/6 for ROB and routine prenatal care  Physical exam  Vitals:   03/18/17 2100 03/18/17 2121 03/18/17 2136 03/18/17 2144  BP:      Pulse: (!) 113 (!) 104 (!) 110 (!) 105  Resp:      Temp:      TempSrc:      SpO2: 96% 97% 94% 95%  Weight:      Height:       General: alert, cooperative and no distress Lungs: CTA b/l Heart: RRR Abdomen: Soft, gravid, non tender DVT Evaluation: No evidence of DVT seen on physical exam.  FHT: baseline 155, mod variability, +accels, no decels Toco: no contractions  Labs: Lab Results  Component Value Date   WBC 17.7 (H) 03/18/2017   HGB 12.8 03/18/2017   HCT 36.8 03/18/2017   MCV 89.3 03/18/2017   PLT 227 03/18/2017   CMP Latest Ref Rng & Units 09/17/2016  Glucose 65 - 99 mg/dL 115(H)  BUN 6 - 20 mg/dL 12  Creatinine 0.44 - 1.00 mg/dL 0.67  Sodium 135 - 145 mmol/L 136  Potassium 3.5 - 5.1 mmol/L 3.5  Chloride 101 - 111 mmol/L  102  CO2 22 - 32 mmol/L 26  Calcium 8.9 - 10.3 mg/dL 9.6  Total Protein 6.5 - 8.1 g/dL 6.7  Total Bilirubin 0.3 - 1.2 mg/dL 0.5  Alkaline Phos 38 - 126 U/L 44  AST 15 - 41 U/L 16  ALT 14 - 54 U/L 22    Discharge instruction: per After Visit Summary and "Baby and Me Booklet".  After visit meds:  Allergies as of 03/19/2017      Reactions   Oxycodone Hcl Rash      Medication List    TAKE these medications   acetaminophen 325 MG tablet Commonly known as:  TYLENOL Take 2 tablets (650 mg total) by mouth every 4 (four) hours as needed (for pain scale < 4  OR  temperature  >/=  100.5 F). What changed:  medication strength  how much to take  when to take this  reasons to  take this   OB COMPLETE PETITE 35-5-1-200 MG Caps Take 1 capsule by mouth daily before breakfast.   promethazine 25 MG tablet Commonly known as:  PHENERGAN Take 1 tablet (25 mg total) by mouth every 6 (six) hours as needed for nausea or vomiting.   ranitidine 150 MG tablet Commonly known as:  ZANTAC Take 1 tablet (150 mg total) by mouth 2 (two) times daily.   vancomycin 125 MG capsule Commonly known as:  VANCOCIN Take 1 capsule (125 mg total) by mouth 4 (four) times daily. What changed:  when to take this       Diet: routine diet  Activity: Advance as tolerated. Pelvic rest for 6 weeks.   Outpatient follow up: Follow up Appt: Future Appointments Date Time Provider [place]  03/26/2017 9:15 AM Shelly Bombard, MD [facility] None   Follow up Visit:No Follow-up on file.   03/19/2017 Mora Bellman, MD

## 2017-03-19 NOTE — Progress Notes (Signed)
In another pt's room from 21:00 to 21:20. Pt's cardio monitor was found flipped over when I went into the room at 21:20. Re-applied cardio monitor and assessed strip for an additional 30 minutes.

## 2017-03-19 NOTE — Discharge Instructions (Signed)
Clostridium Difficile Infection  Clostridium difficile (C. difficile or C. diff) infection is a condition that causes inflammation of the large intestine (colon). This condition can result in damage to the lining of your colon and may lead to colitis. This infection can be passed from person to person (is contagious).  What are the causes?  C. diff is a bacterium that is normally found in the colon. This infection is caused when the balance of C. diff is changed and there is an overgrowth of C. diff. This is often caused by antibiotic use.  What increases the risk?  This condition is more likely to develop in people who:  · Take antibiotic medicines.  · Take a certain type of medicine called proton pump inhibitors over a long period of time (chronic use).  · Are older.  · Have had a C. diff infection before.  · Have serious underlying conditions, such as colon cancer.  · Are in the hospital.  · Have a weak defense (immune) system.  · Live in a place where there is a lot of contact with others, such as a nursing home.  · Have had gastrointestinal (GI) tract surgery.    What are the signs or symptoms?  Symptoms of this condition include:  · Diarrhea. This may be bloody, watery, or yellow or green in color.  · Fever.  · Fatigue.  · Loss of appetite.  · Nausea.  · Swelling, pain, or tenderness in the abdomen.  · Dehydration. Dehydration can cause you to be tired and thirsty, have a dry mouth, and urinate less frequently.    How is this diagnosed?  This condition is diagnosed with a medical history and physical exam. You may also have tests, including:  · A test that checks for C. diff in your stool.  · Blood tests.  · A sigmoidoscopy or colonoscopy to look at your colon. These procedures involve passing an instrument through your rectum to look at the inside of your colon.    How is this treated?  Treatment for this condition includes:  · Antibiotics that keep C. diff from growing.  · Stopping the antibiotics you were  on before the C. diff infection began. Only do this as told by your health care provider.  · Fluids through an IV tube, if you are dehydrated.  · Surgery to remove the infected part of the colon. This is rare.    Follow these instructions at home:  Eating and drinking  · Drink enough fluid to keep your urine clear or pale yellow. Avoid milk, caffeine, and alcohol.  · Follow specific rehydration instructions as told by your health care provider.  · Eat small, frequent meals instead of large meals.  Medicines  · Take your antibiotic medicine as told by your health care provider. Do not stop taking the antibiotic even if you start to feel better unless your health care provider told you to do that.  · Take over-the-counter and prescription medicines only as told by your health care provider.  · Do not use medicines to help with diarrhea.  General instructions  · Wash your hands thoroughly before you prepare food and after you use the bathroom. Make sure people who live with you also wash their hands often.  · Clean surfaces that you touch with a product that contains chlorine bleach.  · Keep all follow-up visits as told by your health care provider. This is important.  Contact a health care provider if:  ·   Your symptoms do not get better with treatment.  · Your symptoms get worse with treatment.  · Your symptoms go away and then return.  · You have a fever.  · You have new symptoms.  Get help right away if:  · You have increasing pain or tenderness in your abdomen.  · You have stool that is mostly bloody, or your stool looks dark black and tarry.  · You cannot eat or drink without vomiting.  · You have signs of dehydration, such as:  ? Dark urine, very little urine, or no urine.  ? Cracked lips.  ? Not making tears when you cry.  ? Dry mouth.  ? Sunken eyes.  ? Sleepiness.  ? Weakness.  ? Dizziness.  This information is not intended to replace advice given to you by your health care provider. Make sure you discuss any  questions you have with your health care provider.  Document Released: 09/16/2005 Document Revised: 05/14/2016 Document Reviewed: 06/10/2015  Elsevier Interactive Patient Education © 2017 Elsevier Inc.

## 2017-03-26 ENCOUNTER — Encounter: Payer: Self-pay | Admitting: Obstetrics

## 2017-03-26 ENCOUNTER — Ambulatory Visit (INDEPENDENT_AMBULATORY_CARE_PROVIDER_SITE_OTHER): Payer: Managed Care, Other (non HMO) | Admitting: Obstetrics

## 2017-03-26 ENCOUNTER — Encounter: Payer: Self-pay | Admitting: *Deleted

## 2017-03-26 VITALS — BP 117/81 | HR 120 | Wt 187.0 lb

## 2017-03-26 DIAGNOSIS — O09529 Supervision of elderly multigravida, unspecified trimester: Secondary | ICD-10-CM

## 2017-03-26 DIAGNOSIS — R12 Heartburn: Secondary | ICD-10-CM

## 2017-03-26 DIAGNOSIS — O09523 Supervision of elderly multigravida, third trimester: Secondary | ICD-10-CM

## 2017-03-26 MED ORDER — OMEPRAZOLE 20 MG PO CPDR: 20.0000 mg | DELAYED_RELEASE_CAPSULE | Freq: Every day | ORAL | 5 refills | Status: DC

## 2017-03-26 NOTE — Progress Notes (Signed)
Subjective:  Megan Colon is a 42 y.o. E3P2951 at 3w4dbeing seen today for ongoing prenatal care.  She is currently monitored for the following issues for this high-risk pregnancy and has Advanced maternal age in multigravida, unspecified trimester; Enteritis due to Clostridium difficile; and Clostridium difficile diarrhea on her problem list.  Patient reports backache, heartburn and pressure.  Contractions: Not present. Vag. Bleeding: None.  Movement: Present. Denies leaking of fluid.   The following portions of the patient's history were reviewed and updated as appropriate: allergies, current medications, past family history, past medical history, past social history, past surgical history and problem list. Problem list updated.  Objective:   Vitals:   03/26/17 0953  BP: 117/81  Pulse: (!) 120  Weight: 187 lb (84.8 kg)    Fetal Status: Fetal Heart Rate (bpm): 150   Movement: Present     General:  Alert, oriented and cooperative. Patient is in no acute distress.  Skin: Skin is warm and dry. No rash noted.   Cardiovascular: Normal heart rate noted  Respiratory: Normal respiratory effort, no problems with respiration noted  Abdomen: Soft, gravid, appropriate for gestational age. Pain/Pressure: Present     Pelvic:  Cervical exam deferred        Extremities: Normal range of motion.  Edema: None  Mental Status: Normal mood and affect. Normal behavior. Normal judgment and thought content.   Urinalysis:      Assessment and Plan:  Pregnancy: [id number] 31w4d1. Heartburn Rx: - omeprazole (PRILOSEC) 20 MG capsule; Take 1 capsule (20 mg total) by mouth daily.  Dispense: 60 capsule; Refill: 5  Preterm labor symptoms and general obstetric precautions including but not limited to vaginal bleeding, contractions, leaking of fluid and fetal movement were reviewed in detail with the patient. Please refer to After Visit Summary for other counseling recommendations.  No Follow-up on  file.   ChShelly BombardMDPatient ID: HiLura Emfemale   DOB: [birth year].o.   MRN: [id number]

## 2017-04-09 ENCOUNTER — Ambulatory Visit (INDEPENDENT_AMBULATORY_CARE_PROVIDER_SITE_OTHER): Payer: Managed Care, Other (non HMO) | Admitting: Obstetrics

## 2017-04-09 ENCOUNTER — Encounter: Payer: Self-pay | Admitting: Obstetrics

## 2017-04-09 VITALS — BP 135/85 | HR 106 | Wt 190.6 lb

## 2017-04-09 DIAGNOSIS — Z3689 Encounter for other specified antenatal screening: Secondary | ICD-10-CM

## 2017-04-09 DIAGNOSIS — O09529 Supervision of elderly multigravida, unspecified trimester: Secondary | ICD-10-CM

## 2017-04-09 DIAGNOSIS — O09523 Supervision of elderly multigravida, third trimester: Secondary | ICD-10-CM

## 2017-04-09 NOTE — Progress Notes (Signed)
Patient feels a lot of pressure when working- request a maternity belt to help with support. Patient has questions about a repeat US.

## 2017-04-09 NOTE — Patient Instructions (Addendum)
Group B Streptococcus Infection During Pregnancy Group B Streptococcus (GBS) is a type of bacteria (Streptococcus agalactiae) that is often found in healthy people, commonly in the rectum, vagina, and intestines. In people who are healthy and not pregnant, the bacteria rarely cause serious illness or complications. However, women who test positive for GBS during pregnancy can pass the bacteria to their baby during childbirth, which can cause serious infection in the baby after birth. Women with GBS may also have infections during their pregnancy or immediately after childbirth, such as such as urinary tract infections (UTIs) or infections of the uterus (uterine infections). Having GBS also increases a woman's risk of complications during pregnancy, such as early (preterm) labor or delivery, miscarriage, or stillbirth. Routine testing (screening) for GBS is recommended for all pregnant women. What increases the risk? You may have a higher risk for GBS infection during pregnancy if you had one during a past pregnancy. What are the signs or symptoms? In most cases, GBS infection does not cause symptoms in pregnant women. Signs and symptoms of a possible GBS-related infection may include:  Labor starting before the 37th week of pregnancy.  A UTI or bladder infection, which may cause:  Fever.  Pain or burning during urination.  Frequent urination.  Fever during labor, along with:  Bad-smelling discharge.  Uterine tenderness.  Rapid heartbeat in the mother, baby, or both. Rare but serious symptoms of a possible GBS-related infection in women include:  Blood infection (septicemia). This may cause fever, chills, or confusion.  Lung infection (pneumonia). This may cause fever, chills, cough, rapid breathing, difficulty breathing, or chest pain.  Bone, joint, skin, or soft tissue infection. How is this diagnosed? You may be screened for GBS between week 35 and week 37 of your pregnancy. If you  have symptoms of preterm labor, you may be screened earlier. This condition is diagnosed based on lab test results from:  A swab of fluid from the vagina and rectum.  A urine sample. How is this treated? This condition is treated with antibiotic medicine. When you go into labor, or as soon as your water breaks (your membranes rupture), you will be given antibiotics through an IV tube. Antibiotics will continue until after you give birth. If you are having a cesarean delivery, you do not need antibiotics unless your membranes have already ruptured. Follow these instructions at home:  Take over-the-counter and prescription medicines only as told by your health care provider.  Take your antibiotic medicine as told by your health care provider. Do not stop taking the antibiotic even if you start to feel better.  Keep all pre-birth (prenatal) visits and follow-up visits as told by your health care provider. This is important. Contact a health care provider if:  You have pain or burning when you urinate.  You have to urinate frequently.  You have a fever or chills.  You develop a bad-smelling vaginal discharge. Get help right away if:  Your membranes rupture.  You go into labor.  You have severe pain in your abdomen.  You have difficulty breathing.  You have chest pain. This information is not intended to replace advice given to you by your health care provider. Make sure you discuss any questions you have with your health care provider. Document Released: 03/15/2008 Document Revised: 07/03/2016 Document Reviewed: 07/02/2016 Elsevier Interactive Patient Education  2017 [employer].

## 2017-04-09 NOTE — Progress Notes (Signed)
Subjective:  Megan Colon is a 41 y.o. [id number] at 60w4dbeing seen today for ongoing prenatal care.  She is currently monitored for the following issues for this high-risk pregnancy and has Advanced maternal age in multigravida, unspecified trimester; Enteritis due to Clostridium difficile; and Clostridium difficile diarrhea on her problem list.  Patient reports backache and pressure.  Contractions: Not present. Vag. Bleeding: None.  Movement: Present. Denies leaking of fluid.   The following portions of the patient's history were reviewed and updated as appropriate: allergies, current medications, past family history, past medical history, past social history, past surgical history and problem list. Problem list updated.  Objective:   Vitals:   04/09/17 0955  BP: 135/85  Pulse: (!) 106  Weight: 190 lb 9.6 oz (86.5 kg)    Fetal Status: Fetal Heart Rate (bpm): 150   Movement: Present     General:  Alert, oriented and cooperative. Patient is in no acute distress.  Skin: Skin is warm and dry. No rash noted.   Cardiovascular: Normal heart rate noted  Respiratory: Normal respiratory effort, no problems with respiration noted  Abdomen: Soft, gravid, appropriate for gestational age. Pain/Pressure: Present     Pelvic:  backache and pressure        Extremities: Normal range of motion.  Edema: None  Mental Status: Normal mood and affect. Normal behavior. Normal judgment and thought content.   Urinalysis:      Assessment and Plan:  Pregnancy: [id number] 32w4d1. Advanced maternal age in multigravida, unspecified trimester   2. Encounter for ultrasound to assess interval growth of fetus Rx: - [place] FOLLOW UP; Future  3. Backache  Rx: - Maternity Belt ordered  Preterm labor symptoms and general obstetric precautions including but not limited to vaginal bleeding, contractions, leaking of fluid and fetal movement were reviewed in detail with the patient. Please refer to After Visit  Summary for other counseling recommendations.  Return in 2 weeks (on 04/23/2017).   ChShelly BombardMDPatient ID: HiLura Emfemale   DOB: [birth year]o.   MRN: [id number]

## 2017-04-19 ENCOUNTER — Other Ambulatory Visit: Payer: Self-pay | Admitting: Obstetrics

## 2017-04-19 ENCOUNTER — Ambulatory Visit (HOSPITAL_COMMUNITY)
Admission: RE | Admit: 2017-04-19 | Discharge: 2017-04-19 | Disposition: A | Discharge status: Home / Self Care | Payer: Medicaid Other | Source: Ambulatory Visit | Attending: Obstetrics | Admitting: Obstetrics

## 2017-04-19 ENCOUNTER — Encounter (HOSPITAL_COMMUNITY): Payer: Self-pay

## 2017-04-19 DIAGNOSIS — O09523 Supervision of elderly multigravida, third trimester: Secondary | ICD-10-CM | POA: Diagnosis present

## 2017-04-19 DIAGNOSIS — Z3A36 36 weeks gestation of pregnancy: Secondary | ICD-10-CM

## 2017-04-19 DIAGNOSIS — Z3689 Encounter for other specified antenatal screening: Secondary | ICD-10-CM

## 2017-04-23 ENCOUNTER — Ambulatory Visit (INDEPENDENT_AMBULATORY_CARE_PROVIDER_SITE_OTHER): Payer: Managed Care, Other (non HMO) | Admitting: Obstetrics

## 2017-04-23 ENCOUNTER — Encounter: Payer: Self-pay | Admitting: Obstetrics

## 2017-04-23 VITALS — BP 125/90 | HR 107 | Wt 193.0 lb

## 2017-04-23 DIAGNOSIS — O09529 Supervision of elderly multigravida, unspecified trimester: Secondary | ICD-10-CM

## 2017-04-23 NOTE — Progress Notes (Signed)
Patient complains of pain and swelling in her knees also numbness after sitting for extended periods.

## 2017-04-23 NOTE — Progress Notes (Signed)
Subjective:  Megan Colon is a 41 y.o. [id number] at 19w4dbeing seen today for ongoing prenatal care.  She is currently monitored for the following issues for this high-risk pregnancy and has Advanced maternal age in multigravida, unspecified trimester; Enteritis due to Clostridium difficile; and Clostridium difficile diarrhea on her problem list.  Patient reports no complaints.  Contractions: Irritability. Vag. Bleeding: None.  Movement: Present. Denies leaking of fluid.   The following portions of the patient's history were reviewed and updated as appropriate: allergies, current medications, past family history, past medical history, past social history, past surgical history and problem list. Problem list updated.  Objective:   Vitals:   04/23/17 0911  BP: 125/90  Pulse: (!) 107  Weight: 193 lb (87.5 kg)    Fetal Status: Fetal Heart Rate (bpm): 150   Movement: Present     General:  Alert, oriented and cooperative. Patient is in no acute distress.  Skin: Skin is warm and dry. No rash noted.   Cardiovascular: Normal heart rate noted  Respiratory: Normal respiratory effort, no problems with respiration noted  Abdomen: Soft, gravid, appropriate for gestational age. Pain/Pressure: Present     Pelvic:  Cervical exam deferred        Extremities: Normal range of motion.  Edema: Trace  Mental Status: Normal mood and affect. Normal behavior. Normal judgment and thought content.   Urinalysis:      Assessment and Plan:  Pregnancy: [id number] 34w4dThere are no diagnoses linked to this encounter. Term labor symptoms and general obstetric precautions including but not limited to vaginal bleeding, contractions, leaking of fluid and fetal movement were reviewed in detail with the patient. Please refer to After Visit Summary for other counseling recommendations.  Return in 1 week (on 04/30/2017).   ChShelly BombardMDPatient ID: HiLura Emfemale   DOB: 08/1976/06/16408.o.   MRN: [id number]

## 2017-04-24 LAB — OB RESULTS CONSOLE GBS: STREP GROUP B AG: NEGATIVE

## 2017-04-25 LAB — STREP GP B NAA: Strep Gp B NAA: NEGATIVE

## 2017-04-29 ENCOUNTER — Encounter: Payer: Self-pay | Admitting: *Deleted

## 2017-04-30 ENCOUNTER — Ambulatory Visit (INDEPENDENT_AMBULATORY_CARE_PROVIDER_SITE_OTHER): Payer: Medicaid Other | Admitting: Certified Nurse Midwife

## 2017-04-30 VITALS — BP 136/89 | HR 101 | Wt 194.0 lb

## 2017-04-30 DIAGNOSIS — O163 Unspecified maternal hypertension, third trimester: Secondary | ICD-10-CM | POA: Diagnosis not present

## 2017-04-30 DIAGNOSIS — O0993 Supervision of high risk pregnancy, unspecified, third trimester: Secondary | ICD-10-CM

## 2017-04-30 DIAGNOSIS — O09529 Supervision of elderly multigravida, unspecified trimester: Secondary | ICD-10-CM

## 2017-04-30 DIAGNOSIS — O09523 Supervision of elderly multigravida, third trimester: Secondary | ICD-10-CM

## 2017-04-30 DIAGNOSIS — A0472 Enterocolitis due to Clostridium difficile, not specified as recurrent: Secondary | ICD-10-CM

## 2017-04-30 DIAGNOSIS — Z3493 Encounter for supervision of normal pregnancy, unspecified, third trimester: Secondary | ICD-10-CM

## 2017-04-30 NOTE — Progress Notes (Signed)
   PRENATAL VISIT NOTE  Subjective:  Megan Colon is a 41 y.o. [id number] at 7w4dbeing seen today for ongoing prenatal care.  She is currently monitored for the following issues for this high-risk pregnancy and has Advanced maternal age in multigravida, unspecified trimester; Enteritis due to Clostridium difficile; Clostridium difficile diarrhea; and Supervision of high risk pregnancy, antepartum, third trimester on her problem list.  Patient reports no complaints.  Contractions: Irregular. Vag. Bleeding: None.  Movement: Present. Denies leaking of fluid.   The following portions of the patient's history were reviewed and updated as appropriate: allergies, current medications, past family history, past medical history, past social history, past surgical history and problem list. Problem list updated.  Objective:   Vitals:   04/30/17 1032  BP: 136/89  Pulse: (!) 101  Weight: 194 lb (88 kg)    Fetal Status: Fetal Heart Rate (bpm): NST; reactive Fundal Height: 40 cm Movement: Present  Presentation: Vertex  General:  Alert, oriented and cooperative. Patient is in no acute distress.  Skin: Skin is warm and dry. No rash noted.   Cardiovascular: Normal heart rate noted  Respiratory: Normal respiratory effort, no problems with respiration noted  Abdomen: Soft, gravid, appropriate for gestational age. Pain/Pressure: Present     Pelvic:  Cervical exam performed Dilation: Fingertip Effacement (%): 0 Station: -3  Extremities: Normal range of motion.  Edema: Trace  Mental Status: Normal mood and affect. Normal behavior. Normal judgment and thought content.  NST: + accels, no decels, moderate variability, Cat. 1 tracing. Irregular contractions on toco about 10 minutes apart.   Assessment and Plan:  Pregnancy: [id number] 338w4d1. Advanced maternal age in multigravida, unspecified trimester      IOL paperwork completed for IOL _0  wks.   - [place] OB FOLLOW UP; Future - Fetal nonstress test;  Future  2. Clostridium difficile diarrhea     Treated, asymptomatic at this time  3. Supervision of high risk pregnancy, antepartum, third trimester     Out of work note completed until completion of postpartum period.  - [place] OB FOLLOW UP; Future  4. Elevated blood pressure affecting pregnancy in third trimester, antepartum      Prior HOB elevated to diastolic 90, today boarderline - CBC - Protein / creatinine ratio, urine - Comp Met (CMET)  Term labor symptoms and general obstetric precautions including but not limited to vaginal bleeding, contractions, leaking of fluid and fetal movement were reviewed in detail with the patient. Please refer to After Visit Summary for other counseling recommendations.  No Follow-up on file.   DeMorene CrockerCNM

## 2017-04-30 NOTE — Progress Notes (Signed)
Pt complains of having right knee pain, and lower back pain.

## 2017-05-01 LAB — CBC
HEMOGLOBIN: 13.2 g/dL (ref 11.1–15.9)
Hematocrit: 39.9 % (ref 34.0–46.6)
MCH: 30.4 pg (ref 26.6–33.0)
MCHC: 33.1 g/dL (ref 31.5–35.7)
MCV: 92 fL (ref 79–97)
Platelets: 187 10*3/uL (ref 150–379)
RBC: 4.34 x10E6/uL (ref 3.77–5.28)
RDW: 14 % (ref 12.3–15.4)
WBC: 10 10*3/uL (ref 3.4–10.8)

## 2017-05-01 LAB — COMPREHENSIVE METABOLIC PANEL
ALBUMIN: 3.8 g/dL (ref 3.5–5.5)
ALT: 11 IU/L (ref 0–32)
AST: 15 IU/L (ref 0–40)
Albumin/Globulin Ratio: 1.6 (ref 1.2–2.2)
Alkaline Phosphatase: 119 IU/L — ABNORMAL HIGH (ref 39–117)
BUN/Creatinine Ratio: 14 (ref 9–23)
BUN: 7 mg/dL (ref 6–24)
Bilirubin Total: 0.2 mg/dL (ref 0.0–1.2)
CHLORIDE: 103 mmol/L (ref 96–106)
CO2: 20 mmol/L (ref 18–29)
Calcium: 9.4 mg/dL (ref 8.7–10.2)
Creatinine, Ser: 0.49 mg/dL — ABNORMAL LOW (ref 0.57–1.00)
GFR calc Af Amer: 141 mL/min/{1.73_m2} (ref >59)
GFR calc non Af Amer: 122 mL/min/{1.73_m2} (ref >59)
GLUCOSE: 72 mg/dL (ref 65–99)
Globulin, Total: 2.4 g/dL (ref 1.5–4.5)
POTASSIUM: 4.4 mmol/L (ref 3.5–5.2)
SODIUM: 139 mmol/L (ref 134–144)
TOTAL PROTEIN: 6.2 g/dL (ref 6.0–8.5)

## 2017-05-01 LAB — PROTEIN / CREATININE RATIO, URINE
Creatinine, Urine: 29.1 mg/dL
PROTEIN/CREAT RATIO: 192 mg/g{creat} (ref 0–200)
Protein, Ur: 5.6 mg/dL

## 2017-05-06 ENCOUNTER — Ambulatory Visit (INDEPENDENT_AMBULATORY_CARE_PROVIDER_SITE_OTHER): Payer: Medicaid Other | Admitting: Certified Nurse Midwife

## 2017-05-06 ENCOUNTER — Inpatient Hospital Stay (HOSPITAL_COMMUNITY): Payer: Medicaid Other | Admitting: Anesthesiology

## 2017-05-06 ENCOUNTER — Inpatient Hospital Stay (HOSPITAL_COMMUNITY)
Admission: AD | Admit: 2017-05-06 | Discharge: 2017-05-11 | DRG: 765 | Disposition: A | Discharge status: Home / Self Care | Payer: Medicaid Other | Source: Ambulatory Visit | Attending: Obstetrics & Gynecology | Admitting: Obstetrics & Gynecology

## 2017-05-06 ENCOUNTER — Other Ambulatory Visit: Payer: Managed Care, Other (non HMO)

## 2017-05-06 ENCOUNTER — Encounter: Payer: Self-pay | Admitting: Certified Nurse Midwife

## 2017-05-06 ENCOUNTER — Encounter (HOSPITAL_COMMUNITY): Payer: Self-pay | Admitting: *Deleted

## 2017-05-06 ENCOUNTER — Encounter: Payer: Self-pay | Admitting: *Deleted

## 2017-05-06 VITALS — BP 134/90 | HR 113 | Temp 99.1°F | Wt 194.2 lb

## 2017-05-06 DIAGNOSIS — Z8249 Family history of ischemic heart disease and other diseases of the circulatory system: Secondary | ICD-10-CM | POA: Diagnosis not present

## 2017-05-06 DIAGNOSIS — J029 Acute pharyngitis, unspecified: Secondary | ICD-10-CM

## 2017-05-06 DIAGNOSIS — Z833 Family history of diabetes mellitus: Secondary | ICD-10-CM | POA: Diagnosis not present

## 2017-05-06 DIAGNOSIS — O99334 Smoking (tobacco) complicating childbirth: Secondary | ICD-10-CM | POA: Diagnosis present

## 2017-05-06 DIAGNOSIS — O133 Gestational [pregnancy-induced] hypertension without significant proteinuria, third trimester: Secondary | ICD-10-CM | POA: Diagnosis not present

## 2017-05-06 DIAGNOSIS — O09523 Supervision of elderly multigravida, third trimester: Secondary | ICD-10-CM | POA: Diagnosis not present

## 2017-05-06 DIAGNOSIS — R05 Cough: Secondary | ICD-10-CM

## 2017-05-06 DIAGNOSIS — F17210 Nicotine dependence, cigarettes, uncomplicated: Secondary | ICD-10-CM | POA: Diagnosis present

## 2017-05-06 DIAGNOSIS — O09529 Supervision of elderly multigravida, unspecified trimester: Secondary | ICD-10-CM

## 2017-05-06 DIAGNOSIS — O134 Gestational [pregnancy-induced] hypertension without significant proteinuria, complicating childbirth: Secondary | ICD-10-CM | POA: Diagnosis present

## 2017-05-06 DIAGNOSIS — O324XX0 Maternal care for high head at term, not applicable or unspecified: Secondary | ICD-10-CM | POA: Diagnosis present

## 2017-05-06 DIAGNOSIS — R509 Fever, unspecified: Secondary | ICD-10-CM

## 2017-05-06 DIAGNOSIS — O328XX0 Maternal care for other malpresentation of fetus, not applicable or unspecified: Secondary | ICD-10-CM | POA: Diagnosis present

## 2017-05-06 DIAGNOSIS — O411030 Infection of amniotic sac and membranes, unspecified, third trimester, not applicable or unspecified: Secondary | ICD-10-CM | POA: Diagnosis not present

## 2017-05-06 DIAGNOSIS — O139 Gestational [pregnancy-induced] hypertension without significant proteinuria, unspecified trimester: Secondary | ICD-10-CM | POA: Diagnosis present

## 2017-05-06 DIAGNOSIS — R350 Frequency of micturition: Secondary | ICD-10-CM

## 2017-05-06 DIAGNOSIS — Z3A38 38 weeks gestation of pregnancy: Secondary | ICD-10-CM

## 2017-05-06 DIAGNOSIS — R059 Cough, unspecified: Secondary | ICD-10-CM

## 2017-05-06 DIAGNOSIS — O0993 Supervision of high risk pregnancy, unspecified, third trimester: Secondary | ICD-10-CM

## 2017-05-06 DIAGNOSIS — Z3490 Encounter for supervision of normal pregnancy, unspecified, unspecified trimester: Secondary | ICD-10-CM

## 2017-05-06 LAB — CBC
HCT: 38.2 % (ref 36.0–46.0)
HEMATOCRIT: 39.2 % (ref 36.0–46.0)
HEMOGLOBIN: 13.7 g/dL (ref 12.0–15.0)
Hemoglobin: 13.1 g/dL (ref 12.0–15.0)
MCH: 31.3 pg (ref 26.0–34.0)
MCH: 30.8 pg (ref 26.0–34.0)
MCHC: 34.9 g/dL (ref 30.0–36.0)
MCHC: 34.3 g/dL (ref 30.0–36.0)
MCV: 89.5 fL (ref 78.0–100.0)
MCV: 89.9 fL (ref 78.0–100.0)
PLATELETS: 165 10*3/uL (ref 150–400)
Platelets: 177 10*3/uL (ref 150–400)
RBC: 4.38 MIL/uL (ref 3.87–5.11)
RBC: 4.25 MIL/uL (ref 3.87–5.11)
RDW: 13.9 % (ref 11.5–15.5)
RDW: 14 % (ref 11.5–15.5)
WBC: 10 10*3/uL (ref 4.0–10.5)
WBC: 10.6 10*3/uL — AB (ref 4.0–10.5)

## 2017-05-06 LAB — POCT RAPID STREP A (OFFICE): RAPID STREP A SCREEN: NEGATIVE

## 2017-05-06 LAB — COMPREHENSIVE METABOLIC PANEL
ALK PHOS: 127 U/L — AB (ref 38–126)
ALT: 19 U/L (ref 14–54)
AST: 20 U/L (ref 15–41)
Albumin: 3.2 g/dL — ABNORMAL LOW (ref 3.5–5.0)
Anion gap: 9 (ref 5–15)
BILIRUBIN TOTAL: 0.5 mg/dL (ref 0.3–1.2)
BUN: 10 mg/dL (ref 6–20)
CO2: 20 mmol/L — ABNORMAL LOW (ref 22–32)
CREATININE: 0.45 mg/dL (ref 0.44–1.00)
Calcium: 9.2 mg/dL (ref 8.9–10.3)
Chloride: 105 mmol/L (ref 101–111)
Glucose, Bld: 86 mg/dL (ref 65–99)
Potassium: 4 mmol/L (ref 3.5–5.1)
Sodium: 134 mmol/L — ABNORMAL LOW (ref 135–145)
Total Protein: 6.6 g/dL (ref 6.5–8.1)

## 2017-05-06 LAB — INFLUENZA A AND B
INFLUENZA A AG, EIA: NEGATIVE
Influenza B Ag, EIA: NEGATIVE

## 2017-05-06 LAB — POCT URINALYSIS DIPSTICK
BILIRUBIN UA: NEGATIVE
Glucose, UA: NEGATIVE
KETONES UA: NEGATIVE
Leukocytes, UA: NEGATIVE
Nitrite, UA: NEGATIVE
PH UA: 6 (ref 5.0–8.0)
SPEC GRAV UA: 1.02 (ref 1.010–1.025)
Urobilinogen, UA: 0.2 E.U./dL

## 2017-05-06 LAB — TYPE AND SCREEN
ABO/RH(D): A POS
ANTIBODY SCREEN: NEGATIVE

## 2017-05-06 LAB — PROTEIN / CREATININE RATIO, URINE
CREATININE, URINE: 135 mg/dL
Protein Creatinine Ratio: 0.09 mg/mg{Cre} (ref 0.00–0.15)
TOTAL PROTEIN, URINE: 12 mg/dL

## 2017-05-06 LAB — PLEASE NOTE:

## 2017-05-06 MED ORDER — TERBUTALINE SULFATE 1 MG/ML IJ SOLN
0.2500 mg | Freq: Once | INTRAMUSCULAR | Status: DC | PRN
  Filled 2017-05-06: qty 1

## 2017-05-06 MED ORDER — OXYTOCIN 40 UNITS IN LACTATED RINGERS INFUSION - SIMPLE MED
1.0000 m[IU]/min | INTRAVENOUS | Status: DC
  Administered 2017-05-06: 2 m[IU]/min via INTRAVENOUS

## 2017-05-06 MED ORDER — LACTATED RINGERS IV SOLN
INTRAVENOUS | Status: DC
  Administered 2017-05-06 – 2017-05-08 (×6): via INTRAVENOUS

## 2017-05-06 MED ORDER — TERBUTALINE SULFATE 1 MG/ML IJ SOLN: 0.2500 mg | Freq: Once | INTRAMUSCULAR | Status: DC | PRN

## 2017-05-06 MED ORDER — SOD CITRATE-CITRIC ACID 500-334 MG/5ML PO SOLN
30.0000 mL | ORAL | Status: DC | PRN
  Administered 2017-05-06 – 2017-05-08 (×2): 30 mL via ORAL
  Filled 2017-05-06 (×3): qty 15

## 2017-05-06 MED ORDER — MISOPROSTOL 25 MCG QUARTER TABLET: 25.0000 ug | ORAL_TABLET | Freq: Once | ORAL | Status: DC

## 2017-05-06 MED ORDER — EPHEDRINE 5 MG/ML INJ: 10.0000 mg | INTRAVENOUS | Status: DC | PRN

## 2017-05-06 MED ORDER — LACTATED RINGERS IV SOLN
500.0000 mL | INTRAVENOUS | Status: DC | PRN
  Administered 2017-05-07: 300 mL via INTRAVENOUS
  Administered 2017-05-08: 500 mL via INTRAVENOUS

## 2017-05-06 MED ORDER — FENTANYL 2.5 MCG/ML BUPIVACAINE 1/10 % EPIDURAL INFUSION (WH - ANES)
14.0000 mL/h | INTRAMUSCULAR | Status: DC | PRN
  Administered 2017-05-06 – 2017-05-08 (×6): 14 mL/h via EPIDURAL
  Filled 2017-05-06 (×5): qty 100

## 2017-05-06 MED ORDER — LIDOCAINE HCL (PF) 1 % IJ SOLN
INTRAMUSCULAR | Status: DC | PRN
  Administered 2017-05-06: 13 mL via EPIDURAL
  Administered 2017-05-07: 6 mL via EPIDURAL
  Administered 2017-05-08: 4 mL
  Administered 2017-05-08: 6 mL via EPIDURAL

## 2017-05-06 MED ORDER — ACETAMINOPHEN 325 MG PO TABS
650.0000 mg | ORAL_TABLET | ORAL | Status: DC | PRN
  Administered 2017-05-06: 650 mg via ORAL
  Filled 2017-05-06: qty 2

## 2017-05-06 MED ORDER — OXYTOCIN 40 UNITS IN LACTATED RINGERS INFUSION - SIMPLE MED
2.5000 [IU]/h | INTRAVENOUS | Status: DC
  Filled 2017-05-06: qty 1000

## 2017-05-06 MED ORDER — FENTANYL CITRATE (PF) 100 MCG/2ML IJ SOLN: 100.0000 ug | INTRAMUSCULAR | Status: DC | PRN

## 2017-05-06 MED ORDER — PHENYLEPHRINE 40 MCG/ML (10ML) SYRINGE FOR IV PUSH (FOR BLOOD PRESSURE SUPPORT)
80.0000 ug | PREFILLED_SYRINGE | INTRAVENOUS | Status: DC | PRN
  Administered 2017-05-08: 80 ug via INTRAVENOUS
  Filled 2017-05-06 (×2): qty 10

## 2017-05-06 MED ORDER — OXYTOCIN 40 UNITS IN LACTATED RINGERS INFUSION - SIMPLE MED: 1.0000 m[IU]/min | INTRAVENOUS | Status: DC

## 2017-05-06 MED ORDER — OXYTOCIN BOLUS FROM INFUSION: 500.0000 mL | Freq: Once | INTRAVENOUS | Status: DC

## 2017-05-06 MED ORDER — PHENYLEPHRINE 40 MCG/ML (10ML) SYRINGE FOR IV PUSH (FOR BLOOD PRESSURE SUPPORT)
PREFILLED_SYRINGE | INTRAVENOUS | Status: AC
  Filled 2017-05-06: qty 10

## 2017-05-06 MED ORDER — FLEET ENEMA 7-19 GM/118ML RE ENEM: 1.0000 | ENEMA | RECTAL | Status: DC | PRN

## 2017-05-06 MED ORDER — ONDANSETRON HCL 4 MG/2ML IJ SOLN
4.0000 mg | Freq: Four times a day (QID) | INTRAMUSCULAR | Status: DC | PRN
  Administered 2017-05-07 (×2): 4 mg via INTRAVENOUS
  Filled 2017-05-06 (×2): qty 2

## 2017-05-06 MED ORDER — MISOPROSTOL 25 MCG QUARTER TABLET
25.0000 ug | ORAL_TABLET | ORAL | Status: DC
  Administered 2017-05-06: 25 ug via VAGINAL
  Filled 2017-05-06: qty 1

## 2017-05-06 MED ORDER — DIPHENHYDRAMINE HCL 50 MG/ML IJ SOLN: 12.5000 mg | INTRAMUSCULAR | Status: DC | PRN

## 2017-05-06 MED ORDER — LIDOCAINE HCL (PF) 1 % IJ SOLN: 30.0000 mL | INTRAMUSCULAR | Status: DC | PRN

## 2017-05-06 MED ORDER — LORATADINE 10 MG PO TABS
10.0000 mg | ORAL_TABLET | Freq: Every day | ORAL | Status: DC | PRN
  Administered 2017-05-06: 10 mg via ORAL
  Filled 2017-05-06 (×2): qty 1

## 2017-05-06 MED ORDER — FENTANYL 2.5 MCG/ML BUPIVACAINE 1/10 % EPIDURAL INFUSION (WH - ANES)
INTRAMUSCULAR | Status: AC
  Filled 2017-05-06: qty 100

## 2017-05-06 MED ORDER — PHENYLEPHRINE 40 MCG/ML (10ML) SYRINGE FOR IV PUSH (FOR BLOOD PRESSURE SUPPORT)
80.0000 ug | PREFILLED_SYRINGE | INTRAVENOUS | Status: DC | PRN
Start: 2017-05-06 — End: 2017-05-08
  Administered 2017-05-07: 80 ug via INTRAVENOUS

## 2017-05-06 MED ORDER — LACTATED RINGERS IV SOLN: 500.0000 mL | Freq: Once | INTRAVENOUS | Status: DC

## 2017-05-06 NOTE — Anesthesia Procedure Notes (Signed)
Epidural Patient location during procedure: OB Start time: 05/06/2017 11:13 PM End time: 05/06/2017 11:29 PM  Staffing Anesthesiologist: Candida Peeling RAY Performed: anesthesiologist   Preanesthetic Checklist Completed: patient identified, site marked, surgical consent, pre-op evaluation, timeout performed, IV checked, risks and benefits discussed and monitors and equipment checked  Epidural Patient position: sitting Prep: DuraPrep Patient monitoring: heart rate, cardiac monitor, continuous pulse ox and blood pressure Approach: midline Location: L2-L3 Injection technique: LOR saline  Needle:  Needle type: Tuohy  Needle gauge: 17 G Needle length: 9 cm Needle insertion depth: 5 cm Catheter type: closed end flexible Catheter size: 20 Guage Catheter at skin depth: 8 cm Test dose: negative  Assessment Events: blood not aspirated, injection not painful, no injection resistance, negative IV test and no paresthesia  Additional Notes Reason for block:procedure for pain

## 2017-05-06 NOTE — H&P (Signed)
LABOR ADMISSION HISTORY AND PHYSICAL  Brittania Sudbeck is a 41 y.o. female [phone] with IUP at 71w3dby 6 week ultrasound presenting for induction of labor for gestational hypertension. She reports +FM, + contractions, No LOF, no VB, no blurry vision, headaches or peripheral edema, and RUQ pain.  She plans on breast and formula feeding. She is still considering birth control options.   Dating: By 6 week ultrasound --->  Estimated Date of Delivery: 05/17/17  Prenatal History/Complications:  Past Medical History: Past Medical History:  Diagnosis Date  . Medical history non-contributory     Past Surgical History: Past Surgical History:  Procedure Laterality Date  . LASIK Bilateral 2006  . NO PAST SURGERIES      Obstetrical History: OB History    Gravida Para Term Preterm AB Living   '6 2 2   3 2   '$ SAB TAB Ectopic Multiple Live Births     3     2      Social History: Social History   Social History  . Marital status: Single    Spouse name: N/A  . Number of children: N/A  . Years of education: N/A   Social History Main Topics  . Smoking status: Current Some Day Smoker    Packs/day: 0.25    Types: Cigarettes    Last attempt to quit: 06/17/2016  . Smokeless tobacco: Never Used  . Alcohol use No  . Drug use: No  . Sexual activity: Yes    Birth control/ protection: None     Comment: plans pills after delivery   Other Topics Concern  . None   Social History Narrative  . None    Family History: Family History  Problem Relation Age of Onset  . Hypertension Mother   . Diabetes Father     Allergies: Allergies  Allergen Reactions  . Oxycodone Hcl Rash    Prescriptions Prior to Admission  Medication Sig Dispense Refill Last Dose  . Prenat-FeCbn-FeAspGl-FA-Omega (OB COMPLETE PETITE) 35-5-1-200 MG CAPS Take 1 capsule by mouth daily before breakfast. 90 capsule 3 Taking     Review of Systems   All systems reviewed and negative except as stated in HPI  LMP  07/14/2016 (LMP Unknown)  General appearance: alert, cooperative, appears stated age and no distress Lungs: clear to auscultation bilaterally Heart: regular rate and rhythm Abdomen: soft, non-tender; bowel sounds normal Pelvic: 1 cm, thick Presentation: cephalic Fetal monitoringBaseline: 140 bpm Uterine activityFrequency: Every ~13 minutes     Prenatal labs: ABO, Rh: --/--/A POS (03/29 1512) Antibody: NEG (03/29 1500) Rubella: !Error! Immune RPR: Non Reactive (03/09 1000)  HBsAg: Negative (10/18 1543)  HIV: Non Reactive (03/09 1000)  GBS: Negative (05/04 0948)  2 hr Glucola normal Genetic screening  AFP/Quad/NIPS normal Anatomy [place] 12/18/2016  Prenatal Transfer Tool  Maternal Diabetes: No Genetic Screening: Normal Maternal Ultrasounds/Referrals: Normal Fetal Ultrasounds or other Referrals:  None Maternal Substance Abuse:  No Significant Maternal Medications:  None Significant Maternal Lab Results: Lab values include: Group B Strep negative   Results for orders placed or performed in visit on 05/06/17 (from the past 24 hour(s))  POCT rapid strep A   Collection Time: 05/06/17 10:45 AM  Result Value Ref Range   Rapid Strep A Screen Negative Negative  POCT Urinalysis Dipstick   Collection Time: 05/06/17 10:47 AM  Result Value Ref Range   Color, UA yellow    Clarity, UA clear    Glucose, UA neg    Bilirubin, UA  neg    Ketones, UA neg    Spec Grav, UA 1.020 1.010 - 1.025   Blood, UA trace    pH, UA 6.0 5.0 - 8.0   Protein, UA trace    Urobilinogen, UA 0.2 0.2 or 1.0 E.U./dL   Nitrite, UA neg    Leukocytes, UA Negative Negative    Patient Active Problem List   Diagnosis Date Noted  . Encounter for induction of labor 05/06/2017  . Supervision of high risk pregnancy, antepartum, third trimester 04/30/2017  . Clostridium difficile diarrhea 03/18/2017  . Enteritis due to Clostridium difficile 03/14/2017  . Advanced maternal age in multigravida, unspecified  trimester 11/06/2016    Assessment: Irie Dowson is a 41 y.o. P7T0626 at 51w3dhere for induction of labor, found to have gestational hypertension at office visit today.  #Labor: cytotec 25 mcg per vagina q4H; FB placed with ease #Pain: IV pain control with fentanyl, patient reports itching with oxycodone in past #FWB: Category I #ID:  GBS negative #MOF: breast feeding and formula feeding #MOC: patient considering, prefers OCP but not a candidate with >413years old and a smoker #Circ:  To be done outpatient  LEverrett Coombe MD PGY1   OB FELLOW HISTORY AND PHYSICAL ATTESTATION  I have seen and examined this patient; I agree with above documentation in the resident's note.    EKatherine Basset DO OB Fellow 05/06/2017, 12:19 PM

## 2017-05-06 NOTE — Progress Notes (Signed)
Labor Progress Note  Megan Colon is a 41 y.o. L4Y5035 at [redacted]w[redacted]d admitted for induction of labor due to Hypertension.  S: Patient doing well. About to get epidural.    O:  BP 138/86   Pulse (!) 108   Temp 98.5 F (36.9 C) (Oral)   Resp 20   Ht '5\' 1"'$  (1.549 m)   Wt 190 lb (86.2 kg)   LMP 07/14/2016 (LMP Unknown)   BMI 35.90 kg/m   No intake/output data recorded.  FHT:  FHR: 155 bpm, variability: moderate,  accelerations:  Present,  decelerations:  Absent UC:   regular, every 2 minutes SVE:   Dilation: 4 Effacement (%): 70 Station: -3 Exam by:: MPulte HomesRN SROM/AROM: Membranes intact Pitocin @ 3 mu/min  Labs: Lab Results  Component Value Date   WBC 10.6 (H) 05/06/2017   HGB 13.1 05/06/2017   HCT 38.2 05/06/2017   MCV 89.9 05/06/2017   PLT 165 05/06/2017    Assessment / Plan: 41y.o. [id number] active labor Induction of labor due to gestational hypertension,  progressing well on pitocin  Labor: Progressing on Pitocin, will continue to increase then AROM Fetal Wellbeing:  Category I Pain Control:  Epidural Anticipated MOD:  NSVD  Expectant management   AbAdin HectorMD, MPH PGY-2 [facility] Pager [phone]

## 2017-05-06 NOTE — Progress Notes (Signed)
   PRENATAL VISIT NOTE  Subjective:  Megan Colon is a 41 y.o. A6W2574 at 13w3dbeing seen today for ongoing prenatal care.  She is currently monitored for the following issues for this high-risk pregnancy and has Advanced maternal age in multigravida, unspecified trimester; Enteritis due to Clostridium difficile; Clostridium difficile diarrhea; and Supervision of high risk pregnancy, antepartum, third trimester on her problem list.  Patient reports fatigue, no bleeding, no leaking, occasional contractions and uri symptoms ?fever. URI symptoms,  reports nasal congestion, cough, no sputum production, fatigue, sore throat.   Contractions: Not present. Vag. Bleeding: None.  Movement: (!) Decreased. Denies leaking of fluid.   The following portions of the patient's history were reviewed and updated as appropriate: allergies, current medications, past family history, past medical history, past social history, past surgical history and problem list. Problem list updated.  Objective:   Vitals:   05/06/17 0922  BP: 134/90  Pulse: (!) 113  Weight: 194 lb 3.2 oz (88.1 kg)  T: 99.1.    Fetal Status: Fetal Heart Rate (bpm): NST; reactive   Movement: (!) Decreased     General:  Alert, oriented and cooperative. Patient is in no acute distress.  Skin: Skin is warm and dry. No rash noted.   Cardiovascular: Normal heart rate noted  Respiratory: Normal respiratory effort, no problems with respiration noted  Abdomen: Soft, gravid, appropriate for gestational age. Pain/Pressure: Absent     Pelvic:  Cervical exam deferred        Extremities: Normal range of motion.     Mental Status: Normal mood and affect. Normal behavior. Normal judgment and thought content.  NST: + accels, no decels, moderate variability, Cat. 1 tracing. Occasional contractions on toco.  Rapid strep: negative Negative influenza A&B.   Assessment and Plan:  Pregnancy: [id number] 387w3d1. Supervision of high risk pregnancy,  antepartum, third trimester Reactive NST.  Acute URI.  GHTN, diagnosed today.  POC discussed with Dr. CoElly ModenaSend for IOL d/t GHTN, no severe features.  Report called to resident.   - [place] FETAL BPP WO NON STRESS; Future  2. Advanced maternal age in multigravida, unspecified trimester     Reactive NST - [place] FETAL BPP WO NON STRESS; Future  3. History of C-diff    Treated previously  Term labor symptoms and general obstetric precautions including but not limited to vaginal bleeding, contractions, leaking of fluid and fetal movement were reviewed in detail with the patient. Please refer to After Visit Summary for other counseling recommendations.  Return in about 4 weeks (around 06/03/2017) for Postpartum.   RaMorene CrockerCNM

## 2017-05-06 NOTE — Progress Notes (Signed)
Patient ID: Megan Colon, female   DOB: May 24, 1976, 41 y.o.   MRN: [id number]  S: Patient seen & examined for progress of labor. Patient comfortable.    O:  Vitals:   05/06/17 1301 05/06/17 1404 05/06/17 1405 05/06/17 1449  BP: 114/86  133/80 137/66  Pulse: (!) 105  (!) 104 91  Resp:  20    Temp: 98.7 F (37.1 C)     TempSrc: Oral     Weight:      Height:        Dilation: 1 Effacement (%): 50 Cervical Position: Posterior Presentation: Vertex Exam by:: Johnetta Sloniker Vertex by bedside ultrasound  FHT: 135 bpm, mod var, +accels, no decels TOCO: q3-79mn   A/P: Starting pitocin FB still in place Continue expectant management Anticipate SVD

## 2017-05-06 NOTE — Anesthesia Preprocedure Evaluation (Signed)
Anesthesia Evaluation  Patient identified by MRN, date of birth, ID band Patient awake    Reviewed: Allergy & Precautions, NPO status , Patient's Chart, lab work & pertinent test results  Airway Mallampati: II  TM Distance: >3 FB Neck ROM: Full    Dental no notable dental hx.    Pulmonary neg pulmonary ROS, Current Smoker,    Pulmonary exam normal breath sounds clear to auscultation       Cardiovascular hypertension, negative cardio ROS Normal cardiovascular exam Rhythm:Regular Rate:Normal     Neuro/Psych negative neurological ROS  negative psych ROS   GI/Hepatic negative GI ROS, Neg liver ROS,   Endo/Other  negative endocrine ROS  Renal/GU negative Renal ROS  negative genitourinary   Musculoskeletal negative musculoskeletal ROS (+)   Abdominal   Peds negative pediatric ROS (+)  Hematology negative hematology ROS (+)   Anesthesia Other Findings   Reproductive/Obstetrics negative OB ROS (+) Pregnancy                             Anesthesia Physical Anesthesia Plan  ASA: II  Anesthesia Plan: Epidural   Post-op Pain Management:    Induction:   Airway Management Planned:   Additional Equipment:   Intra-op Plan:   Post-operative Plan:   Informed Consent:   Plan Discussed with:   Anesthesia Plan Comments:         Anesthesia Quick Evaluation

## 2017-05-06 NOTE — Anesthesia Pain Management Evaluation Note (Signed)
  CRNA Pain Management Visit Note  Patient: Megan Colon, 41 y.o., female  "Hello I am a member of the anesthesia team at [facility]. We have an anesthesia team available at all times to provide care throughout the hospital, including epidural management and anesthesia for C-section. I don't know your plan for the delivery whether it a natural birth, water birth, IV sedation, nitrous supplementation, doula or epidural, but we want to meet your pain goals."   1.Was your pain managed to your expectations on prior hospitalizations?   Yes   2.What is your expectation for pain management during this hospitalization?     Epidural  3.How can we help you reach that goal? Epidural   Record the patient's initial score and the patient's pain goal.   Pain: 0  Pain Goal: 3 [facility] wants you to be able to say your pain was always managed very well.  Parthiv Mucci 05/06/2017

## 2017-05-06 NOTE — Progress Notes (Signed)
Patient complains of stuffy nose, coughing and sore throat/chest pain. Pt states that symptoms have been going on for 2 days and she has felt decreased fetal movement since symptoms started.

## 2017-05-07 ENCOUNTER — Encounter: Payer: Managed Care, Other (non HMO) | Admitting: Certified Nurse Midwife

## 2017-05-07 ENCOUNTER — Encounter (HOSPITAL_COMMUNITY): Payer: Self-pay | Admitting: *Deleted

## 2017-05-07 LAB — CULTURE, OB URINE

## 2017-05-07 LAB — RPR: RPR Ser Ql: NONREACTIVE

## 2017-05-07 MED ORDER — GENTAMICIN SULFATE 40 MG/ML IJ SOLN
150.0000 mg | Freq: Three times a day (TID) | INTRAMUSCULAR | Status: DC
  Administered 2017-05-07: 150 mg via INTRAVENOUS
  Filled 2017-05-07 (×3): qty 3.75

## 2017-05-07 MED ORDER — SODIUM CHLORIDE 0.9 % IV SOLN
2.0000 g | Freq: Four times a day (QID) | INTRAVENOUS | Status: DC
  Administered 2017-05-07 – 2017-05-08 (×2): 2 g via INTRAVENOUS
  Filled 2017-05-07 (×4): qty 2000

## 2017-05-07 MED ORDER — OXYTOCIN 40 UNITS IN LACTATED RINGERS INFUSION - SIMPLE MED
1.0000 m[IU]/min | INTRAVENOUS | Status: DC
  Administered 2017-05-07: 12 m[IU]/min via INTRAVENOUS

## 2017-05-07 MED ORDER — SODIUM BICARBONATE 8.4 % IV SOLN
INTRAVENOUS | Status: DC | PRN
  Administered 2017-05-07: 4 mL via EPIDURAL
  Administered 2017-05-08: 5 mL via EPIDURAL
  Administered 2017-05-08: 4 mL via EPIDURAL
  Administered 2017-05-08: 5 mL via EPIDURAL

## 2017-05-07 MED ORDER — LACTATED RINGERS IV SOLN
INTRAVENOUS | Status: DC
  Administered 2017-05-08: 01:00:00 via INTRAUTERINE

## 2017-05-07 MED ORDER — ACETAMINOPHEN 500 MG PO TABS
1000.0000 mg | ORAL_TABLET | Freq: Once | ORAL | Status: AC
  Administered 2017-05-07: 1000 mg via ORAL
  Filled 2017-05-07: qty 2

## 2017-05-07 NOTE — Progress Notes (Signed)
Subjective: Patient seen and examined for progress of labor. Patient still feeling more pain and pressure on her right side.  Objective: Vitals:   05/07/17 2231 05/07/17 2236 05/07/17 2330 05/07/17 2352  BP: 99/71  (!) 154/87   Pulse: 92  88   Resp:  18  18  Temp:    98.6 F (37 C)  TempSrc:    Oral  SpO2:      Weight:      Height:        FHT:  FHR: 120 bpm, variability: moderate,  accelerations:  Present,  decelerations:  Absent UC:   regular, every 2-3 minutes SVE:   Dilation: 6 Effacement (%): 80 Station: -1 Exam by:: C.Steward Sames Pitocin @ 8 mu/min   Assessment / Plan: IUP at term. IOL for GHTN. GBS neg. Prolonged latent labor. Cervical edema.   Cervix very edematous on patient's right side. Will attempt to place patient on right side with peanut to help with pain and reduce swelling. Recheck in 1-2 hours  Len Blalock SNM 05/07/2017, 11:58 PM

## 2017-05-07 NOTE — Progress Notes (Signed)
Patient ID: Megan Colon, female   DOB: Nov 29, 1976, 41 y.o.   MRN: [id number]  S: Patient seen & examined due to [facility]. Patient comfortable with epidural now that it was redosed.    O:  Vitals:   05/07/17 1936 05/07/17 1940 05/07/17 1945 05/07/17 2055  BP: (!) 151/89 (!) 148/86 128/75   Pulse: 97 97 98   Resp:   16   Temp:    99 F (37.2 C)  TempSrc:    Oral  SpO2:      Weight:      Height:        Dilation: 5 Effacement (%): 80 Cervical Position: Middle Station: -1 Presentation: Vertex Exam by:: Dr.Cire Deyarmin   FHT: 155bpm, mod var, no accels, repetitive variable decels after each contraction, nadir to 70s with return to baseline IUPC: q1.54mn, <200 MVU    A/P: Pitocin stopped Position change to left lateral side Amnioinfusion started Continue expectant management Guarded prognosis for SVD

## 2017-05-07 NOTE — Progress Notes (Signed)
S: Patient seen & examined for progress of labor. Patient comfortable with epidural.   O:  Vitals:   05/07/17 0700 05/07/17 0730 05/07/17 0841 05/07/17 0930  BP: (!) 100/57 109/60  106/60  Pulse: 87 91  86  Resp:    18  Temp:   99.2 F (37.3 C)   TempSrc:   Axillary   SpO2: 100% 94%  96%  Weight:      Height:        Dilation: 5 Effacement (%): 70 Cervical Position: Posterior Station: -2 Presentation: Vertex Exam by:: Wilford Sports, RN   FHT: 140bpm, mod var, +accels, no decels TOCO: q53mn   A/P: 41y.o. [id number] active labor, induction of labor due to gestational hypertension, progressing well on pitocin  Labor: progressing on pitocin Fetal Wellbeing:  Category I Pain Control:  Epidural Anticipated MOD:  NSVD  Continue expectant management Anticipate SVD

## 2017-05-07 NOTE — Progress Notes (Signed)
Labor Progress Note  Megan Colon is a 41 y.o. V0Y5486 at [redacted]w[redacted]d admitted for induction of labor due to Hypertension.  S: Resting comfortably. Denies breathing difficulty. Reporting pressure.    O:  BP 105/70   Pulse 86   Temp 98.8 F (37.1 C) (Axillary)   Resp 20   Ht '5\' 1"'$  (1.549 m)   Wt 190 lb (86.2 kg)   LMP 07/14/2016 (LMP Unknown)   SpO2 100%   BMI 35.90 kg/m   No intake/output data recorded.  FHT:  FHR: 130 bpm, variability: moderate,  accelerations:  Present,  decelerations:  Absent UC:   regular, every 2 minutes SVE:   Dilation: 5 Effacement (%): 70 Station: -1 Exam by:: Megan Colon SROM/AROM: SROM clear fluid at midnight  Labs: Lab Results  Component Value Date   WBC 10.6 (H) 05/06/2017   HGB 13.1 05/06/2017   HCT 38.2 05/06/2017   MCV 89.9 05/06/2017   PLT 165 05/06/2017    Assessment / Plan: 41y.o. [id number] active labor Induction of labor due to gestational hypertension,  progressing well on pitocin  Labor: Progressing on Pitocin, will continue to increase then AROM Fetal Wellbeing:  Category I Pain Control:  Epidural Anticipated MOD:  NSVD  Expectant management   AbAdin HectorMD, MPH PGY-2 [facility] Pager [phone]

## 2017-05-07 NOTE — Progress Notes (Signed)
Pharmacy Antibiotic Note  Megan Colon is a 41 y.o. female admitted on 05/06/2017 for IOL due ti gestational hypertension.  Pharmacy has been consulted for Gentamicin dosing to r/o Triple I due to maternal temp during labor  Plan: Gentamicin '150mg'$  IV q8h Will plan to follow and assess need for further workup or labs.  Height: '5\' 1"'$  (154.9 cm) Weight: 190 lb (86.2 kg) IBW/kg (Calculated) : 47.8  Dosing weight: 59.3kg  Temp (24hrs), Avg:99.2 F (37.3 C), Min:98.2 F (36.8 C), Max:100.2 F (37.9 C)   Recent Labs Lab 05/06/17 1145 05/06/17 2227  WBC 10.0 10.6*  CREATININE 0.45  --     Estimated Creatinine Clearance: 93.3 mL/min (by C-G formula based on SCr of 0.45 mg/dL).    Allergies  Allergen Reactions  . Oxycodone Hcl Rash    Antimicrobials this admission: Ampicillin 2 gram IV q6h  5/18 >>  Dose adjustments this admission:   Microbiology results:   Thank you for allowing pharmacy to be a part of this patient's care.  Vernie Ammons 05/07/2017 7:48 PM

## 2017-05-07 NOTE — Progress Notes (Signed)
Patient ID: Megan Colon, female   DOB: [birth year], 41 y.o.   MRN: [id number]  S: Patient seen & examined for progress of labor. Patient has been slightly uncomfortable with epidural.     O:  Vitals:   05/07/17 1831 05/07/17 1854 05/07/17 1900 05/07/17 1921  BP: (!) 146/84  (!) 153/83   Pulse: 99  97   Resp:      Temp:  100.2 F (37.9 C)  100.2 F (37.9 C)  TempSrc:  Axillary  Axillary  SpO2:      Weight:      Height:        Dilation: 5 Effacement (%): 80 Cervical Position: Middle Station: -1 Presentation: Vertex Exam by:: Dr.Arianni Gallego   FHT: 155bpm, mod-marked var, +accels, no decels IUPC: Adequate contractions to near adequate, 180-210 MVU. q57min  A/P: Triple I -  Patient had low-grade temperature of 100.79F, there is an odor emitting from the amniotic fluid, and baby's FHR changed baseline from 125 to 155 and did not respond to 1L fluid bolus. - Start Amp/Gent - Tylenol '1000mg'$  PO - s/p 1 L IVF bolus  Continue expectant management Anticipate SVD

## 2017-05-07 NOTE — Progress Notes (Signed)
Labor Progress Note  Megan Colon is a 41 y.o. H7G9021 at [redacted]w[redacted]d admitted for induction of labor due to Hypertension.  S: Resting comfortably. Reporting some pressure but no pain.     O:  BP 105/73   Pulse (!) 101   Temp 99.1 F (37.3 C) (Oral)   Resp 20   Ht '5\' 1"'$  (1.549 m)   Wt 190 lb (86.2 kg)   LMP 07/14/2016 (LMP Unknown)   SpO2 99%   BMI 35.90 kg/m   No intake/output data recorded.  FHT:  FHR: 145 bpm, variability: moderate,  accelerations:  Present,  decelerations:  Absent UC:   regular, every 2 minutes SVE:   Dilation: 5 Effacement (%): 80 Station: -2 Exam by:: MDreama SaaRN SROM/AROM: Membranes intact Pitocin @ 2 mu/min  Labs: Lab Results  Component Value Date   WBC 10.6 (H) 05/06/2017   HGB 13.1 05/06/2017   HCT 38.2 05/06/2017   MCV 89.9 05/06/2017   PLT 165 05/06/2017    Assessment / Plan: 41y.o. [id number] active labor Induction of labor due to gestational hypertension,  progressing well on pitocin  Labor: Progressing on Pitocin, will continue to increase then AROM Fetal Wellbeing:  Category I Pain Control:  Epidural Anticipated MOD:  NSVD  Expectant management   AbAdin HectorMD, MPH PGY-2 [facility] Pager [phone]

## 2017-05-08 ENCOUNTER — Encounter (HOSPITAL_COMMUNITY): Payer: Self-pay | Admitting: Anesthesiology

## 2017-05-08 ENCOUNTER — Encounter (HOSPITAL_COMMUNITY)
Admission: AD | Disposition: A | Discharge status: Home / Self Care | Payer: Self-pay | Source: Ambulatory Visit | Attending: Obstetrics & Gynecology

## 2017-05-08 DIAGNOSIS — O134 Gestational [pregnancy-induced] hypertension without significant proteinuria, complicating childbirth: Secondary | ICD-10-CM

## 2017-05-08 DIAGNOSIS — Z3A38 38 weeks gestation of pregnancy: Secondary | ICD-10-CM

## 2017-05-08 DIAGNOSIS — O411030 Infection of amniotic sac and membranes, unspecified, third trimester, not applicable or unspecified: Secondary | ICD-10-CM

## 2017-05-08 LAB — CBC
HCT: 38.6 % (ref 36.0–46.0)
HEMATOCRIT: 39.5 % (ref 36.0–46.0)
Hemoglobin: 13.4 g/dL (ref 12.0–15.0)
Hemoglobin: 13.3 g/dL (ref 12.0–15.0)
MCH: 31 pg (ref 26.0–34.0)
MCH: 31.5 pg (ref 26.0–34.0)
MCHC: 33.9 g/dL (ref 30.0–36.0)
MCHC: 34.5 g/dL (ref 30.0–36.0)
MCV: 91.4 fL (ref 78.0–100.0)
MCV: 91.5 fL (ref 78.0–100.0)
PLATELETS: 135 10*3/uL — AB (ref 150–400)
Platelets: 155 10*3/uL (ref 150–400)
RBC: 4.32 MIL/uL (ref 3.87–5.11)
RBC: 4.22 MIL/uL (ref 3.87–5.11)
RDW: 14.1 % (ref 11.5–15.5)
RDW: 14.2 % (ref 11.5–15.5)
WBC: 24.1 10*3/uL — ABNORMAL HIGH (ref 4.0–10.5)
WBC: 22.3 10*3/uL — AB (ref 4.0–10.5)

## 2017-05-08 SURGERY — Surgical Case
Anesthesia: Epidural

## 2017-05-08 MED ORDER — COCONUT OIL OIL
1.0000 "application " | TOPICAL_OIL | Status: DC | PRN
  Filled 2017-05-08: qty 120

## 2017-05-08 MED ORDER — WITCH HAZEL-GLYCERIN EX PADS: 1.0000 "application " | MEDICATED_PAD | CUTANEOUS | Status: DC | PRN

## 2017-05-08 MED ORDER — NALOXONE HCL 2 MG/2ML IJ SOSY
1.0000 ug/kg/h | PREFILLED_SYRINGE | INTRAVENOUS | Status: DC | PRN
  Filled 2017-05-08: qty 2

## 2017-05-08 MED ORDER — SIMETHICONE 80 MG PO CHEW
80.0000 mg | CHEWABLE_TABLET | ORAL | Status: DC
  Administered 2017-05-08 – 2017-05-11 (×3): 80 mg via ORAL
  Filled 2017-05-08 (×3): qty 1

## 2017-05-08 MED ORDER — NALBUPHINE HCL 10 MG/ML IJ SOLN: 5.0000 mg | INTRAMUSCULAR | Status: DC | PRN

## 2017-05-08 MED ORDER — FENTANYL CITRATE (PF) 100 MCG/2ML IJ SOLN
INTRAMUSCULAR | Status: AC
  Filled 2017-05-08: qty 2

## 2017-05-08 MED ORDER — SODIUM CHLORIDE 0.9 % IV SOLN
3.0000 g | Freq: Four times a day (QID) | INTRAVENOUS | Status: AC
  Administered 2017-05-08 – 2017-05-09 (×3): 3 g via INTRAVENOUS
  Filled 2017-05-08 (×3): qty 3

## 2017-05-08 MED ORDER — IBUPROFEN 600 MG PO TABS
600.0000 mg | ORAL_TABLET | Freq: Four times a day (QID) | ORAL | Status: DC
  Administered 2017-05-08 – 2017-05-11 (×12): 600 mg via ORAL
  Filled 2017-05-08 (×11): qty 1

## 2017-05-08 MED ORDER — SENNOSIDES-DOCUSATE SODIUM 8.6-50 MG PO TABS
2.0000 | ORAL_TABLET | ORAL | Status: DC
  Administered 2017-05-08 – 2017-05-11 (×3): 2 via ORAL
  Filled 2017-05-08 (×4): qty 2

## 2017-05-08 MED ORDER — ZOLPIDEM TARTRATE 5 MG PO TABS: 5.0000 mg | ORAL_TABLET | Freq: Every evening | ORAL | Status: DC | PRN

## 2017-05-08 MED ORDER — CEFAZOLIN SODIUM-DEXTROSE 2-4 GM/100ML-% IV SOLN
2.0000 g | Freq: Once | INTRAVENOUS | Status: AC
  Administered 2017-05-08: 2 g via INTRAVENOUS

## 2017-05-08 MED ORDER — FENTANYL CITRATE (PF) 100 MCG/2ML IJ SOLN
25.0000 ug | INTRAMUSCULAR | Status: DC | PRN
  Administered 2017-05-08 (×2): 50 ug via INTRAVENOUS

## 2017-05-08 MED ORDER — MORPHINE SULFATE (PF) 0.5 MG/ML IJ SOLN
INTRAMUSCULAR | Status: AC
  Filled 2017-05-08: qty 10

## 2017-05-08 MED ORDER — PRENATAL MULTIVITAMIN CH
1.0000 | ORAL_TABLET | Freq: Every day | ORAL | Status: DC
  Administered 2017-05-08 – 2017-05-10 (×2): 1 via ORAL
  Filled 2017-05-08 (×3): qty 1

## 2017-05-08 MED ORDER — MEASLES, MUMPS & RUBELLA VAC ~~LOC~~ INJ
0.5000 mL | INJECTION | Freq: Once | SUBCUTANEOUS | Status: DC
  Filled 2017-05-08: qty 0.5

## 2017-05-08 MED ORDER — MEPERIDINE HCL 25 MG/ML IJ SOLN: 6.2500 mg | INTRAMUSCULAR | Status: DC | PRN

## 2017-05-08 MED ORDER — ONDANSETRON HCL 4 MG/2ML IJ SOLN
INTRAMUSCULAR | Status: AC
  Filled 2017-05-08: qty 2

## 2017-05-08 MED ORDER — LIDOCAINE-EPINEPHRINE (PF) 2 %-1:200000 IJ SOLN
INTRAMUSCULAR | Status: AC
  Filled 2017-05-08: qty 40

## 2017-05-08 MED ORDER — OXYTOCIN 10 UNIT/ML IJ SOLN
INTRAMUSCULAR | Status: DC | PRN
  Administered 2017-05-08: 40 [IU] via INTRAVENOUS

## 2017-05-08 MED ORDER — OXYTOCIN 10 UNIT/ML IJ SOLN
INTRAMUSCULAR | Status: AC
  Filled 2017-05-08: qty 4

## 2017-05-08 MED ORDER — METHYLERGONOVINE MALEATE 0.2 MG PO TABS: 0.2000 mg | ORAL_TABLET | ORAL | Status: DC | PRN

## 2017-05-08 MED ORDER — NALBUPHINE HCL 10 MG/ML IJ SOLN: 5.0000 mg | Freq: Once | INTRAMUSCULAR | Status: DC | PRN

## 2017-05-08 MED ORDER — SODIUM CHLORIDE 0.9 % IV SOLN
3.0000 g | Freq: Four times a day (QID) | INTRAVENOUS | Status: DC
  Filled 2017-05-08 (×4): qty 3

## 2017-05-08 MED ORDER — SIMETHICONE 80 MG PO CHEW
80.0000 mg | CHEWABLE_TABLET | Freq: Three times a day (TID) | ORAL | Status: DC
  Administered 2017-05-08 – 2017-05-11 (×8): 80 mg via ORAL
  Filled 2017-05-08 (×8): qty 1

## 2017-05-08 MED ORDER — ACETAMINOPHEN 325 MG PO TABS
650.0000 mg | ORAL_TABLET | ORAL | Status: DC | PRN
  Administered 2017-05-10 – 2017-05-11 (×3): 650 mg via ORAL
  Filled 2017-05-08 (×4): qty 2

## 2017-05-08 MED ORDER — MENTHOL 3 MG MT LOZG: 1.0000 | LOZENGE | OROMUCOSAL | Status: DC | PRN

## 2017-05-08 MED ORDER — OXYTOCIN 40 UNITS IN LACTATED RINGERS INFUSION - SIMPLE MED: 2.5000 [IU]/h | INTRAVENOUS | Status: AC

## 2017-05-08 MED ORDER — TETANUS-DIPHTH-ACELL PERTUSSIS 5-2.5-18.5 LF-MCG/0.5 IM SUSP: 0.5000 mL | Freq: Once | INTRAMUSCULAR | Status: DC

## 2017-05-08 MED ORDER — METHYLERGONOVINE MALEATE 0.2 MG/ML IJ SOLN: 0.2000 mg | INTRAMUSCULAR | Status: DC | PRN

## 2017-05-08 MED ORDER — SCOPOLAMINE 1 MG/3DAYS TD PT72
MEDICATED_PATCH | TRANSDERMAL | Status: AC
  Filled 2017-05-08: qty 1

## 2017-05-08 MED ORDER — PHENYLEPHRINE 8 MG IN D5W 100 ML (0.08MG/ML) PREMIX OPTIME: INJECTION | INTRAVENOUS | Status: DC | PRN

## 2017-05-08 MED ORDER — SCOPOLAMINE 1 MG/3DAYS TD PT72
MEDICATED_PATCH | TRANSDERMAL | Status: DC | PRN
  Administered 2017-05-08: 1 via TRANSDERMAL

## 2017-05-08 MED ORDER — NALBUPHINE HCL 10 MG/ML IJ SOLN
5.0000 mg | INTRAMUSCULAR | Status: DC | PRN
Start: 2017-05-08 — End: 2017-05-11

## 2017-05-08 MED ORDER — PRENATAL PLUS 27-1 MG PO TABS
1.0000 | ORAL_TABLET | Freq: Every day | ORAL | Status: DC
  Administered 2017-05-09 – 2017-05-11 (×3): 1 via ORAL
  Filled 2017-05-08 (×3): qty 1

## 2017-05-08 MED ORDER — PHENYLEPHRINE HCL 10 MG/ML IJ SOLN
INTRAMUSCULAR | Status: DC | PRN
  Administered 2017-05-08 (×4): 80 ug via INTRAVENOUS

## 2017-05-08 MED ORDER — PHENYLEPHRINE 40 MCG/ML (10ML) SYRINGE FOR IV PUSH (FOR BLOOD PRESSURE SUPPORT)
PREFILLED_SYRINGE | INTRAVENOUS | Status: AC
  Filled 2017-05-08: qty 10

## 2017-05-08 MED ORDER — SODIUM CHLORIDE 0.9% FLUSH: 3.0000 mL | INTRAVENOUS | Status: DC | PRN

## 2017-05-08 MED ORDER — ONDANSETRON HCL 4 MG/2ML IJ SOLN
INTRAMUSCULAR | Status: DC | PRN
  Administered 2017-05-08: 4 mg via INTRAVENOUS

## 2017-05-08 MED ORDER — NALOXONE HCL 0.4 MG/ML IJ SOLN: 0.4000 mg | INTRAMUSCULAR | Status: DC | PRN

## 2017-05-08 MED ORDER — PANTOPRAZOLE SODIUM 40 MG PO TBEC
40.0000 mg | DELAYED_RELEASE_TABLET | Freq: Every day | ORAL | Status: DC
  Administered 2017-05-08 – 2017-05-10 (×3): 40 mg via ORAL
  Filled 2017-05-08 (×4): qty 1

## 2017-05-08 MED ORDER — ONDANSETRON HCL 4 MG/2ML IJ SOLN: 4.0000 mg | Freq: Three times a day (TID) | INTRAMUSCULAR | Status: DC | PRN

## 2017-05-08 MED ORDER — SIMETHICONE 80 MG PO CHEW: 80.0000 mg | CHEWABLE_TABLET | ORAL | Status: DC | PRN

## 2017-05-08 MED ORDER — DIPHENHYDRAMINE HCL 25 MG PO CAPS
25.0000 mg | ORAL_CAPSULE | ORAL | Status: DC | PRN
  Administered 2017-05-08: 25 mg via ORAL
  Filled 2017-05-08: qty 1

## 2017-05-08 MED ORDER — LACTATED RINGERS IV SOLN: INTRAVENOUS | Status: DC

## 2017-05-08 MED ORDER — HYDROCODONE-ACETAMINOPHEN 7.5-325 MG PO TABS
1.0000 | ORAL_TABLET | ORAL | Status: DC | PRN
  Administered 2017-05-09: 1 via ORAL
  Filled 2017-05-08: qty 1

## 2017-05-08 MED ORDER — LACTATED RINGERS IV SOLN
INTRAVENOUS | Status: DC | PRN
  Administered 2017-05-08: 05:00:00 via INTRAVENOUS

## 2017-05-08 MED ORDER — SCOPOLAMINE 1 MG/3DAYS TD PT72
1.0000 | MEDICATED_PATCH | Freq: Once | TRANSDERMAL | Status: DC
  Filled 2017-05-08: qty 1

## 2017-05-08 MED ORDER — DIBUCAINE 1 % RE OINT: 1.0000 "application " | TOPICAL_OINTMENT | RECTAL | Status: DC | PRN

## 2017-05-08 MED ORDER — FENTANYL CITRATE (PF) 100 MCG/2ML IJ SOLN
INTRAMUSCULAR | Status: DC | PRN
  Administered 2017-05-08: 50 ug via INTRAVENOUS

## 2017-05-08 MED ORDER — DIPHENHYDRAMINE HCL 25 MG PO CAPS: 25.0000 mg | ORAL_CAPSULE | Freq: Four times a day (QID) | ORAL | Status: DC | PRN

## 2017-05-08 MED ORDER — MORPHINE SULFATE (PF) 0.5 MG/ML IJ SOLN
INTRAMUSCULAR | Status: DC | PRN
  Administered 2017-05-08: 4 mg via EPIDURAL

## 2017-05-08 MED ORDER — ACETAMINOPHEN 500 MG PO TABS
1000.0000 mg | ORAL_TABLET | Freq: Four times a day (QID) | ORAL | Status: AC
  Administered 2017-05-08 (×2): 1000 mg via ORAL
  Filled 2017-05-08 (×2): qty 2

## 2017-05-08 MED ORDER — DIPHENHYDRAMINE HCL 50 MG/ML IJ SOLN: 12.5000 mg | INTRAMUSCULAR | Status: DC | PRN

## 2017-05-08 SURGICAL SUPPLY — 38 items
BENZOIN TINCTURE PRP APPL 2/3 (GAUZE/BANDAGES/DRESSINGS) ×3 IMPLANT
CHLORAPREP W/TINT 26ML (MISCELLANEOUS) ×6 IMPLANT
CLAMP CORD UMBIL (MISCELLANEOUS) IMPLANT
CLOSURE STERI STRIP 1/2 X4 (GAUZE/BANDAGES/DRESSINGS) ×3 IMPLANT
CLOTH BEACON ORANGE TIMEOUT ST (SAFETY) ×3 IMPLANT
DERMABOND ADVANCED (GAUZE/BANDAGES/DRESSINGS) ×2
DERMABOND ADVANCED .7 DNX12 (GAUZE/BANDAGES/DRESSINGS) ×1 IMPLANT
DRSG OPSITE POSTOP 4X10 (GAUZE/BANDAGES/DRESSINGS) ×3 IMPLANT
ELECT REM PT RETURN 9FT ADLT (ELECTROSURGICAL) ×3
ELECTRODE REM PT RTRN 9FT ADLT (ELECTROSURGICAL) ×1 IMPLANT
EXTRACTOR VACUUM BELL STYLE (SUCTIONS) IMPLANT
GLOVE BIOGEL PI IND STRL 7.0 (GLOVE) ×1 IMPLANT
GLOVE BIOGEL PI IND STRL 8 (GLOVE) ×1 IMPLANT
GLOVE BIOGEL PI INDICATOR 7.0 (GLOVE) ×2
GLOVE BIOGEL PI INDICATOR 8 (GLOVE) ×2
GLOVE ECLIPSE 8.0 STRL XLNG CF (GLOVE) ×3 IMPLANT
GOWN STRL REUS W/TWL LRG LVL3 (GOWN DISPOSABLE) ×6 IMPLANT
KIT ABG SYR 3ML LUER SLIP (SYRINGE) ×3 IMPLANT
NEEDLE HYPO 18GX1.5 BLUNT FILL (NEEDLE) ×3 IMPLANT
NEEDLE HYPO 22GX1.5 SAFETY (NEEDLE) ×3 IMPLANT
NEEDLE HYPO 25X5/8 SAFETYGLIDE (NEEDLE) ×3 IMPLANT
NS IRRIG 1000ML POUR BTL (IV SOLUTION) ×3 IMPLANT
PACK C SECTION WH (CUSTOM PROCEDURE TRAY) ×3 IMPLANT
PAD OB MATERNITY 4.3X12.25 (PERSONAL CARE ITEMS) ×3 IMPLANT
PENCIL SMOKE EVAC W/HOLSTER (ELECTROSURGICAL) ×3 IMPLANT
RTRCTR C-SECT PINK 25CM LRG (MISCELLANEOUS) IMPLANT
SUT CHROMIC 0 CT 1 (SUTURE) ×3 IMPLANT
SUT MNCRL 0 VIOLET CTX 36 (SUTURE) ×2 IMPLANT
SUT MONOCRYL 0 CTX 36 (SUTURE) ×4
SUT PLAIN 2 0 (SUTURE)
SUT PLAIN 2 0 XLH (SUTURE) IMPLANT
SUT PLAIN ABS 2-0 CT1 27XMFL (SUTURE) IMPLANT
SUT VIC AB 0 CTX 36 (SUTURE) ×2
SUT VIC AB 0 CTX36XBRD ANBCTRL (SUTURE) ×1 IMPLANT
SUT VIC AB 4-0 KS 27 (SUTURE) IMPLANT
SYR 20CC LL (SYRINGE) ×6 IMPLANT
TOWEL OR 17X24 6PK STRL BLUE (TOWEL DISPOSABLE) ×3 IMPLANT
TRAY FOLEY BAG SILVER LF 14FR (SET/KITS/TRAYS/PACK) IMPLANT

## 2017-05-08 NOTE — Transfer of Care (Signed)
Immediate Anesthesia Transfer of Care Note  Patient: Megan Colon  Procedure(s) Performed: Procedure(s): CESAREAN SECTION (N/A)  Patient Location: PACU  Anesthesia Type:Epidural  Level of Consciousness: awake, alert , oriented and patient cooperative  Airway & Oxygen Therapy: Patient Spontanous Breathing and Patient connected to nasal cannula oxygen  Post-op Assessment: Post -op Vital signs reviewed and stable  Post vital signs: Reviewed and stable  Last Vitals:  Vitals:   05/08/17 0307 05/08/17 0400  BP:  128/80  Pulse:  78  Resp: 16   Temp:      Last Pain:  Vitals:   05/08/17 0307  TempSrc:   PainSc: 0-No pain      Patients Stated Pain Goal: 2 (61/47/09 2957)  Complications: No apparent anesthesia complications

## 2017-05-08 NOTE — Progress Notes (Signed)
Megan Colon is a 41 y.o. [id number] at 60w5dby ultrasound admitted for induction of labor due to gestational hypertension.  Subjective:   Objective: BP (!) 130/91   Pulse 71   Temp 98.2 F (36.8 C) (Oral)   Resp 16   Ht '5\' 1"'$  (1.549 m)   Wt 190 lb (86.2 kg)   LMP 07/14/2016 (LMP Unknown)   SpO2 100%   BMI 35.90 kg/m  I/O last 3 completed shifts: In: -  Out: 900 [Urine:900] Total I/O In: -  Out: 200 [Urine:200]  FHT:  FHR: 140 bpm, variability: moderate,  accelerations:  Present,  decelerations:  Present variables UC:   irregular, every 3 minutes SVE:   Dilation: 6 Effacement (%): 80 Station: -1 Exam by:: Dr.Eure  Labs: Lab Results  Component Value Date   WBC 24.1 (H) 05/08/2017   HGB 13.4 05/08/2017   HCT 39.5 05/08/2017   MCV 91.4 05/08/2017   PLT 155 05/08/2017    Assessment / Plan: Arrest in active phase of labor  Labor:  Preeclampsia:   Fetal Wellbeing:  Category II Pain Control:  Epidural I/D:   Anticipated MOD:  Will proceed with Caesarean section, asynclitism   EURE,LUTHER H 05/08/2017, 3:59 AM

## 2017-05-08 NOTE — Transfer of Care (Signed)
Immediate Anesthesia Transfer of Care Note  Patient: Megan Colon  Procedure(s) Performed: Procedure(s): CESAREAN SECTION (N/A)  Patient Location: PACU  Anesthesia Type:Epidural  Level of Consciousness: awake, alert  and oriented  Airway & Oxygen Therapy: Patient connected to nasal cannula oxygen  Post-op Assessment: Report given to RN and Post -op Vital signs reviewed and stable  Post vital signs: Reviewed and stable  Last Vitals:  Vitals:   05/08/17 0400 05/08/17 0540  BP: 128/80 99/60  Pulse: 78 93  Resp:  19  Temp:  36.4 C    Last Pain:  Vitals:   05/08/17 0540  TempSrc: Oral  PainSc: 0-No pain      Patients Stated Pain Goal: 2 (61/95/09 3267)  Complications: No apparent anesthesia complications

## 2017-05-08 NOTE — Anesthesia Postprocedure Evaluation (Signed)
Anesthesia Post Note  Patient: Megan Colon  Procedure(s) Performed: Procedure(s) (LRB): CESAREAN SECTION (N/A)  Patient location during evaluation: [facility] Anesthesia Type: Epidural Level of consciousness: awake and alert and oriented Pain management: pain level controlled Vital Signs Assessment: post-procedure vital signs reviewed and stable Respiratory status: spontaneous breathing and nonlabored ventilation Cardiovascular status: stable Postop Assessment: no headache, no backache, patient able to bend at knees, epidural receding, no signs of nausea or vomiting and adequate PO intake Anesthetic complications: no        Last Vitals:  Vitals:   05/08/17 0729 05/08/17 0835  BP: 124/87 132/69  Pulse: 94 68  Resp: 20 18  Temp: 36.9 C 36.8 C    Last Pain:  Vitals:   05/08/17 0835  TempSrc: Oral  PainSc:    Pain Goal: Patients Stated Pain Goal: 2 (05/07/17 1936)               Jabier Mutton

## 2017-05-08 NOTE — Anesthesia Procedure Notes (Signed)
Epidural Patient location during procedure: OB  Staffing Anesthesiologist: Lyndle Herrlich  Preanesthetic Checklist Completed: patient identified, site marked, surgical consent, pre-op evaluation, timeout performed, IV checked, risks and benefits discussed and monitors and equipment checked  Epidural Patient position: sitting Prep: site prepped and draped and DuraPrep Patient monitoring: continuous pulse ox and blood pressure Approach: midline Location: L2-L3 Injection technique: LOR air  Needle:  Needle type: Tuohy  Needle gauge: 17 G Needle length: 9 cm and 9 Needle insertion depth: 5 cm cm Catheter type: closed end flexible Catheter size: 19 Gauge Catheter at skin depth: 11 cm Test dose: negative  Assessment Events: blood not aspirated, injection not painful, no injection resistance, negative IV test and no paresthesia  Additional Notes Dosing of Epidural:  1st dose, through catheter ............................................Marland Kitchen  Xylocaine 40 mg  2nd dose, through catheter, after waiting 3 minutes........Marland KitchenXylocaine 60 mg    As each dose occurred, patient was free of IV sx; and patient exhibited no evidence of SA injection.  Patient is more comfortable after epidural dosed. Please see RN's note for documentation of vital signs,and FHR which are stable.  Patient reminded not to try to ambulate with numb legs, and that an RN must be present when she attempts to get up.

## 2017-05-08 NOTE — Lactation Note (Addendum)
This note was copied from a baby's chart. Lactation Consultation Note  Patient Name: Boy Devonne Kitchen LIDCV'U Date: 05/08/2017 Reason for consult: Initial assessment;NICU baby  Visited with 3rd time Mom of NICU baby, born at [redacted]w[redacted]d   Mom has history of PIH, intrapartum chorioamnionitis, C-Section for failure to progress Mom has pumped X 1 and transported a few drops to NICU.   Reviewed with Mom to pump >8 times in 24 hrs.  Offered to assist with her 2nd pumping, but Mom not feeling up to it.  Pump parts washed and lying on paper towel.  Encouraged Mom to call for assistance with pumping when she is feeling better.  Basics reviewed with Mom.  NICU brochure copy given to Mom, along with Lactation brochure.   Lactation to follow-up prn and daily.  Consult Status Consult Status: Follow-up Date: 05/09/17 Follow-up type: [place]5/19/2018, 2:54 PM

## 2017-05-08 NOTE — Op Note (Signed)
Preoperative diagnosis:  1.  Intrauterine pregnancy at [redacted]w[redacted]d weeks gestation                                         2.  Arrest of dilatation and descent in the first stage of labor                                         3.  Asynclitism                                         4.  Intra Amniotic Infection   Postoperative diagnosis:  Same as above   Procedure:  Primary cesarean section  Surgeon:  LFlorian BuffMD  Assistant:    Anesthesia: epidural  Findings:  .    Over a low transverse incision was delivered a viable female with Apgars of 1 and 6 and 9 weighing pending lbs.  oz. Uterus, tubes and ovaries were all normal.  There were no other significant findings  Description of operation:  Patient was taken to the operating room and placed in the sitting position where she underwent a spinal anesthetic. She was then placed in the supine position with tilt to the left side. When adequate anesthetic level was obtained she was prepped and draped in usual sterile fashion and a Foley catheter was placed. A Pfannenstiel skin incision was made and carried down sharply to the rectus fascia which was scored in the midline extended laterally. The fascia was taken off the muscles both superiorly and without difficulty. The muscles were divided.  The peritoneal cavity was entered.  Bladder blade was placed, no bladder flap was created.  A low transverse hysterotomy incision was made and delivered a viable female  infant at 05with Apgars of 1 and 6 and 9 weighingpending lbs  oz.  Cord pH was obtained and was 7.29. The uterus was exteriorized. It was closed in 2 layers, the first being a running interlocking layer and the second being an imbricating layer using 0 monocryl on a CTX needle. There was good resulting hemostasis. The uterus tubes and ovaries were all normal. Peritoneal cavity was irrigated vigorously. The muscles and peritoneum were reapproximated loosely. The fascia was closed using 0 Vicryl in  running fashion. Subcutaneous tissue was made hemostatic and irrigated. The skin was closed using 4-0 Vicryl on a Keith needle in a subcuticular fashion.  Dermabond was placed for additional wound integrity and to serve as a barrier. Blood loss for the procedure was 1000 cc. The patient received 2 gram of Ancef prophylactically. The patient was taken to the recovery room in good stable condition with all counts being correct x3.  EBL 1000 cc  EURE,LUTHER H 05/08/2017 5:31 AM

## 2017-05-09 ENCOUNTER — Encounter (HOSPITAL_COMMUNITY): Payer: Self-pay | Admitting: Obstetrics & Gynecology

## 2017-05-09 LAB — CBC
HCT: 30.9 % — ABNORMAL LOW (ref 36.0–46.0)
Hemoglobin: 10.7 g/dL — ABNORMAL LOW (ref 12.0–15.0)
MCH: 31.7 pg (ref 26.0–34.0)
MCHC: 34.6 g/dL (ref 30.0–36.0)
MCV: 91.4 fL (ref 78.0–100.0)
PLATELETS: 146 10*3/uL — AB (ref 150–400)
RBC: 3.38 MIL/uL — ABNORMAL LOW (ref 3.87–5.11)
RDW: 14.3 % (ref 11.5–15.5)
WBC: 14.6 10*3/uL — AB (ref 4.0–10.5)

## 2017-05-09 MED ORDER — HYDROMORPHONE HCL 2 MG PO TABS
2.0000 mg | ORAL_TABLET | ORAL | Status: DC | PRN
  Administered 2017-05-09 – 2017-05-10 (×5): 2 mg via ORAL
  Filled 2017-05-09 (×8): qty 1

## 2017-05-09 MED ORDER — HYDROCODONE-ACETAMINOPHEN 7.5-325 MG PO TABS
1.0000 | ORAL_TABLET | ORAL | Status: DC | PRN
  Administered 2017-05-09: 2 via ORAL
  Filled 2017-05-09: qty 2

## 2017-05-09 MED ORDER — HYDROMORPHONE HCL 2 MG PO TABS
4.0000 mg | ORAL_TABLET | ORAL | Status: DC | PRN
  Administered 2017-05-10 – 2017-05-11 (×4): 4 mg via ORAL
  Filled 2017-05-09 (×4): qty 2

## 2017-05-09 NOTE — Progress Notes (Signed)
Subjective: Postpartum Day 1: Cesarean Delivery Patient reports tolerating PO, + flatus and no problems voiding.    Objective: Vital signs in last 24 hours: Temp:  [98.2 F (36.8 C)-99.1 F (37.3 C)] 99.1 F (37.3 C) (05/20 0300) Pulse Rate:  [68-102] 78 (05/20 0300) Resp:  [16-18] 16 (05/20 0300) BP: (113-138)/(60-87) 128/72 (05/20 0300) SpO2:  [94 %-98 %] 97 % (05/19 2235)  Physical Exam:  General: alert, cooperative and no distress Lochia: appropriate Uterine Fundus: firm Incision: healing well, no significant drainage  Recent Labs  05/08/17 0629 05/09/17 0503  HGB 13.3 10.7*  HCT 38.6 30.9*    Assessment/Plan: Status post Cesarean section. Doing well postoperatively.  Continue current care.  Len Blalock SNM 05/09/2017, 7:44 AM

## 2017-05-09 NOTE — Lactation Note (Signed)
This note was copied from a baby's chart. Lactation Consultation Note  Patient Name: Megan Colon ILNZV'J Date: 05/09/2017 Reason for consult: Follow-up assessment Baby at 30 hr of life and in the NICU. Mom is worried she is not making enough milk and her milk "will not come at all". She has DEBP set up in the room but has not used it today. Offered to help her and she declined because she is going to take her visitors to see the baby. Stressed the importance of pumping 8-12x/12hr. Mom voiced understanding. She reports having labels and containers to take expressed milk to the baby. Reviewed milk handling and pump settings. Mom is aware of lactation services and support group.    Maternal Data    Feeding Feeding Type: Formula Nipple Type: Slow - flow Length of feed: 20 min  LATCH Score/Interventions                      Lactation Tools Discussed/Used     Consult Status Consult Status: Follow-up Date: 05/10/17 Follow-up type: In-patient    Denzil Hughes 05/09/2017, 11:20 AM

## 2017-05-10 ENCOUNTER — Encounter (HOSPITAL_COMMUNITY): Payer: Self-pay | Admitting: Obstetrics & Gynecology

## 2017-05-10 NOTE — Lactation Note (Signed)
This note was copied from a baby's chart. Lactation Consultation Note  Patient Name: Megan Colon JKQAS'U Date: 05/10/2017 Reason for consult: Follow-up assessment   Follow up with mom on High Risk OB. Mom was feeding infant a bottle of formula while he was laying on back in his crib. Asked parents to please hold infant while feeding in case he chokes, FOB picked infant up. Mom reports she has not pumped and did not attempt infant at breast. Mom and infant to be transferred to [facility]. Enc mom to call out for BF assistance as needed.    Maternal Data Formula Feeding for Exclusion: Yes Reason for exclusion: Mother's choice to formula and breast feed on admission  Feeding    LATCH Score/Interventions                      Lactation Tools Discussed/Used     Consult Status Consult Status: Follow-up Date: 05/11/17 Follow-up type: In-patient    Debby Freiberg Taras Rask 05/10/2017, 3:35 PM

## 2017-05-10 NOTE — Lactation Note (Signed)
This note was copied from a baby's chart. Lactation Consultation Note  Patient Name: Boy Dalores Weger YQMGN'O Date: 05/10/2017 Reason for consult: Follow-up assessment;NICU baby   Follow up with mom of 45 hour old NICU infant. Mom reports she is pumping every 2-3 hours with DEBP. She reports she is hand expressing post pumping. She is not obtaining colostrum at this time. Enc her to continue to pump every 2-3 hours for 15 minutes followed by hand expression. Reviewed breast milk labeling with mom. Mom without further questions/concerns at this time.    Maternal Data Formula Feeding for Exclusion: Yes Reason for exclusion: Mother's choice to formula and breast feed on admission Has patient been taught Hand Expression?: Yes (per mom )  Feeding Feeding Type: Formula Nipple Type: Slow - flow Length of feed: 20 min  LATCH Score/Interventions                      Lactation Tools Discussed/Used Pump Review: Setup, frequency, and cleaning;Milk Storage Initiated by:: Reviewed   Consult Status Consult Status: Follow-up Date: 05/11/17 Follow-up type: In-patient    Debby Freiberg Markala Sitts 05/10/2017, 9:21 AM

## 2017-05-10 NOTE — Progress Notes (Signed)
POSTPARTUM PROGRESS NOTE  Post Partum Day 2 Subjective:  Megan Colon is a 41 y.o. [id number] 29w0dwho was admitted for induction of labor for gestational hypertension, now s/p C section for arrest of dilatation and descent, intra-amniotic infection, asynclitism. No acute events overnight.  Pt denies problems with ambulating, voiding or po intake.  Pain is well controlled.    Objective: Blood pressure 114/68, pulse 87, temperature 98.1 F (36.7 C), temperature source Oral, resp. rate 17, height '5\' 1"'$  (1.549 m), weight 190 lb (86.2 kg), last menstrual period 07/14/2016, SpO2 98 %.  Physical Exam:  General: alert, cooperative and no distress Lochia:normal flow Chest: CTAB Heart: RRR no m/r/g Abdomen: +BS, soft, nontender,  Uterine Fundus: firm below the umbilicus DVT Evaluation: +pedal edema up to knees, no calf tenderness   Recent Labs  05/08/17 0629 05/09/17 0503  HGB 13.3 10.7*  HCT 38.6 30.9*    Assessment/Plan:  ASSESSMENT: HNaysha Colon a 41y.o. [id number] #2 s/p C-section, doing well. - Plan for DC tomorrow - Birth control planning - IUD - Currently breast and bottle feeding  Plan for discharge tomorrow   LOS: 4 days   Megan Colon

## 2017-05-10 NOTE — Lactation Note (Signed)
This note was copied from a baby's chart. Lactation Consultation Note  Patient Name: Megan Colon IBBCW'U Date: 05/10/2017 Reason for consult: Follow-up assessment;NICU baby   Follow up with mom of 70 hour old infant in NICU with infant's first BF. Dad holding infant and infant rooting. Mom reports she tried to latch him without success. Mom with soft compressible breasts with flat nipples at rest. Nipples do evert with stimulation. Showed mom how to latch infant in the cross cradle hold using the tea cup hold. Infant latch on and off for about 10 minutes. He seemed more bothered by the decreased flow. Infant was then offered a bottle of formula after BF.   Discussed with mom using breast shells and possibly a NS and a 5 fr feeding tube at the breast with feedings. Mom wants to try later once infant returns to floor. Infant is to be transferred back to mom's room this afternoon. Mom is to have nurse call when infant ready to feed again.    Maternal Data Formula Feeding for Exclusion: Yes Reason for exclusion: Mother's choice to formula and breast feed on admission Has patient been taught Hand Expression?: Yes Does the patient have breastfeeding experience prior to this delivery?: Yes  Feeding Feeding Type: Breast Fed Nipple Type: Slow - flow Length of feed: 5 min  LATCH Score/Interventions                      Lactation Tools Discussed/Used Pump Review: Setup, frequency, and cleaning;Milk Storage Initiated by:: Reviewed   Consult Status Consult Status: Follow-up Date: 05/10/17 Follow-up type: In-patient    Megan Colon [name] 05/10/2017, 11:18 AM

## 2017-05-11 ENCOUNTER — Encounter (HOSPITAL_COMMUNITY): Payer: Self-pay | Admitting: Obstetrics & Gynecology

## 2017-05-11 MED ORDER — IBUPROFEN 600 MG PO TABS: 600.0000 mg | ORAL_TABLET | Freq: Four times a day (QID) | ORAL | 0 refills | Status: DC

## 2017-05-11 MED ORDER — OXYCODONE-ACETAMINOPHEN 5-325 MG PO TABS: 1.0000 | ORAL_TABLET | Freq: Four times a day (QID) | ORAL | 0 refills | Status: DC | PRN

## 2017-05-11 NOTE — Discharge Instructions (Signed)

## 2017-05-11 NOTE — Discharge Summary (Signed)
OB Discharge Summary  Patient Name: Megan Colon DOB: 1976/06/24 MRN: [id number]  Date of admission: 05/06/2017 Delivering MD: Florian Buff   Date of discharge: 05/11/2017  Admitting diagnosis: INDUCTION Intrauterine pregnancy: [redacted]w[redacted]d    Secondary diagnosis:Active Problems:   Encounter for induction of labor   Gestational hypertension  Additional problems:arrest of descent     Discharge diagnosis: Term Pregnancy Delivered                                                                      Augmentation: AROM, Pitocin, Cytotec and Foley Balloon  Complications: None  Hospital course:  Induction of Labor With Cesarean Section  41y.o. yo [id number] 37w1das admitted to the hospital 05/06/2017 for induction of labor. Patient had a labor course significant for arrest of descent. The patient went for cesarean section due to Arrest of Descent, and delivered a Viable infant,'@BABYSUPPRESS'$ (DBLINK,ept,110,,1,,) Membrane Rupture Time/Date: )11:55 PM ,05/07/2017   '@Details'$  of operation can be found in separate operative Note.  Patient had an uncomplicated postpartum course. She is ambulating, tolerating a regular diet, passing flatus, and urinating well.  Patient is discharged home in stable condition on 05/11/17.                                    Physical exam  Vitals:   05/10/17 0753 05/10/17 1235 05/10/17 1700 05/11/17 0603  BP: (!) 111/59 123/61 100/68 (!) 123/49  Pulse: 67 67 70 70  Resp: '18 17 18 18  '$ Temp: 98.1 F (36.7 C) 98.3 F (36.8 C) 98.7 F (37.1 C) 98 F (36.7 C)  TempSrc: Oral Oral Oral Oral  SpO2: 99% 99% 99% 97%  Weight:      Height:       General: alert Lochia: appropriate Uterine Fundus: firm Incision: Dressing is clean, dry, and intact DVT Evaluation: No evidence of DVT seen on physical exam. Labs: Lab Results  Component Value Date   WBC 14.6 (H) 05/09/2017   HGB 10.7 (L) 05/09/2017   HCT 30.9 (L) 05/09/2017   MCV 91.4 05/09/2017   PLT 146 (L)  05/09/2017   CMP Latest Ref Rng & Units 05/06/2017  Glucose 65 - 99 mg/dL 86  BUN 6 - 20 mg/dL 10  Creatinine 0.44 - 1.00 mg/dL 0.45  Sodium 135 - 145 mmol/L 134(L)  Potassium 3.5 - 5.1 mmol/L 4.0  Chloride 101 - 111 mmol/L 105  CO2 22 - 32 mmol/L 20(L)  Calcium 8.9 - 10.3 mg/dL 9.2  Total Protein 6.5 - 8.1 g/dL 6.6  Total Bilirubin 0.3 - 1.2 mg/dL 0.5  Alkaline Phos 38 - 126 U/L 127(H)  AST 15 - 41 U/L 20  ALT 14 - 54 U/L 19    Discharge instruction: per After Visit Summary and "Baby and Me Booklet".  After Visit Meds:  Allergies as of 05/11/2017      Reactions   Oxycodone Hcl Itching      Medication List    TAKE these medications   ibuprofen 600 MG tablet Commonly known as:  ADVIL,MOTRIN Take 1 tablet (600 mg total) by mouth every 6 (six) hours.  OB COMPLETE PETITE 35-5-1-200 MG Caps Take 1 capsule by mouth daily before breakfast.   omeprazole 20 MG capsule Commonly known as:  PRILOSEC Take 1 capsule by mouth daily as needed (heartburn).   oxyCODONE-acetaminophen 5-325 MG tablet Commonly known as:  PERCOCET/ROXICET Take 1-2 tablets by mouth every 6 (six) hours as needed.       Diet: routine diet  Activity: Advance as tolerated. Pelvic rest for 6 weeks.   Outpatient follow up:4 weeks Follow up Appt:Future Appointments Date Time Provider [place]  05/13/2017 9:15 AM WH-MFC [place] 4 WH-MFCUS MFC-US   Follow up visit: No Follow-up on file.  Postpartum contraception: IUD Mirena  Newborn Data: Live born female  Birth Weight: 8 lb 2.2 oz (3690 g) APGAR: 1, 6  Baby Feeding: bottle Disposition:home with mother   05/11/2017 Emily Filbert, MD

## 2017-05-11 NOTE — Addendum Note (Signed)
Addendum  created 05/11/17 1529 by Lyndle Herrlich, MD   Anesthesia Event edited, Anesthesia Staff edited

## 2017-05-11 NOTE — Lactation Note (Addendum)
This note was copied from a baby's chart. Lactation Consultation Note  Patient Name: Boy Finnlee Guarnieri CWCBJ'S Date: 05/11/2017 Reason for consult: Follow-up assessment   With this mom of a term baby, now 66 hours old. Mom tried pumping but stopped due to pain. Mom has flat nipples, and was using a 27 flange. This was pulling on her areola and probably the cause of the pain. I gave mom coconut oil to apply to her nipples, and then had her pump with 21 flanges. She said this size felt much better, but her breast were tender. I explained that her milk was transitioning in, and it was normal for them to be tender. Mom's breast felt full, heavy, but she was only able to express a few drops of milk. This was fed to the baby with a bottle nipple, and mom was smiling/pleased. I showed her how to use the yellow syringe pump part to attach tubing and pump both breasts. Mom will try and pump 8 times a day, and will call wic and see if they will give her a DEP. Mom is formula feeding  For now, and will feed EBM if she is able to express some. Breast care reviewed, and mom knows  to call lactation as needed.    Maternal Data    Feeding    LATCH Score/Interventions                      Lactation Tools Discussed/Used     Consult Status Consult Status: Complete Follow-up type: Call as needed    Tonna Corner 05/11/2017, 10:41 AM

## 2017-05-13 ENCOUNTER — Ambulatory Visit (HOSPITAL_COMMUNITY): Payer: No Typology Code available for payment source

## 2017-05-14 ENCOUNTER — Encounter: Payer: Managed Care, Other (non HMO) | Admitting: Certified Nurse Midwife

## 2017-05-18 ENCOUNTER — Inpatient Hospital Stay (HOSPITAL_COMMUNITY): Payer: No Typology Code available for payment source

## 2017-05-18 ENCOUNTER — Telehealth: Payer: Self-pay

## 2017-05-18 NOTE — Telephone Encounter (Signed)
Pt called stating that she needs a letter stating that she will be out of work for [employer] to help pay her rent. Pt informed that letter will be written for 8 weeks due to c-section.

## 2017-06-02 ENCOUNTER — Encounter: Payer: Self-pay | Admitting: Obstetrics

## 2017-06-02 ENCOUNTER — Ambulatory Visit (INDEPENDENT_AMBULATORY_CARE_PROVIDER_SITE_OTHER): Payer: Managed Care, Other (non HMO) | Admitting: Obstetrics

## 2017-06-02 NOTE — Progress Notes (Signed)
Post Partum Exam  Megan Colon is a 41 y.o. P5T6144 female who presents for a postpartum visit. She is 4 weeks postpartum following a low cervical transverse Cesarean section. I have fully reviewed the prenatal and intrapartum course. The delivery was at 1 gestational weeks.  Anesthesia: spinal. Postpartum course has been normal. Baby's course has been normal. Baby is feeding by bottle - Similac Advance. Bleeding no bleeding. Bowel function is normal. Bladder function is normal. Patient is sexually actLungs:ive. Contraception method is none and withdrawl. Postpartum depression screening:neg  The following portions of the patient's history were reviewed and updated as appropriate: allergies, current medications, past family history, past medical history, past social history, past surgical history and problem list.  Review of Systems A comprehensive review of systems was negative.    Objective:  Last menstrual period 07/14/2016.  PE:          General:  Alert and no distress          Lungs:  Clear          CVS:   RRR          Breasts:  Soft, non tender          Abdomen:  Soft. Non tender.  Incision C, D, I.           Assessment:  1. Postpartum care following cesarean delivery   Normal postpartum exam. Pap smear not done at today's visit.   Contraceptive Counseling and Advice.  Wants IUD.  Plan:   1. Contraception: IUD 2. IUD Rx - ParaGuard IUD recommended. 3. Follow up in: 4 weeks for ParaGuard IUD Insertion.

## 2017-06-30 ENCOUNTER — Encounter: Payer: Self-pay | Admitting: Obstetrics

## 2017-06-30 ENCOUNTER — Encounter: Payer: Self-pay | Admitting: *Deleted

## 2017-06-30 ENCOUNTER — Ambulatory Visit (INDEPENDENT_AMBULATORY_CARE_PROVIDER_SITE_OTHER): Payer: Medicaid Other | Admitting: Obstetrics

## 2017-06-30 NOTE — Progress Notes (Signed)
Subjective:     Megan Colon is a 41 y.o. female who presents for a postpartum visit. She is 7 weeks postpartum following a low cervical transverse Cesarean section. I have fully reviewed the prenatal and intrapartum course. The delivery was at 68 gestational weeks. Outcome: primary cesarean section, low transverse incision. Anesthesia: epidural. Postpartum course has been normal. Baby's course has been normal. Baby is feeding by bottle - Similac Advance. Bleeding thin lochia. Bowel function is abnormal: with constipation. Bladder function is normal. Patient is sexually active. Contraception method is none. Postpartum depression screening: negative.  Tobacco, alcohol and substance abuse history reviewed.  Adult immunizations reviewed including TDAP, rubella and varicella.  The following portions of the patient's history were reviewed and updated as appropriate: allergies, current medications, past family history, past medical history, past social history, past surgical history and problem list.  Review of Systems A comprehensive review of systems was negative except for: Gastrointestinal: positive for constipation   Objective:    There were no vitals taken for this visit.  General:  alert and no distress   Breasts:  inspection negative, no nipple discharge or bleeding, no masses or nodularity palpable  Lungs: clear to auscultation bilaterally  Heart:  regular rate and rhythm, S1, S2 normal, no murmur, click, rub or gallop  Abdomen: soft, non-tender; bowel sounds normal; no masses,  no organomegaly and incision is C, D, I.   Vulva:  normal  Vagina: normal vagina  Cervix:  no cervical motion tenderness  Corpus: normal size, contour, position, consistency, mobility, non-tender  Adnexa:  no mass, fullness, tenderness  Rectal Exam: Not performed.          50% of 15 min visit spent on counseling and coordination of care.    Assessment:     Normal postpartum exam. Pap smear not done at today's  visit.    Contraceptive Counseling and Advice  Constipation  Plan:    1. Contraception: IUD 2. Mirena IUD Rx 3. MOM Rx for constipation 4. Follow up in: 1 month for IUD Insertion  Healthy lifestyle practices reviewed

## 2017-06-30 NOTE — Progress Notes (Signed)
Patient states that she does not want to get IUD inserted today, and wants to come back for it.

## 2017-07-02 NOTE — Addendum Note (Signed)
Addendum  created 07/02/17 1301 by Lyndle Herrlich, MD   Sign clinical note

## 2017-07-02 NOTE — Anesthesia Postprocedure Evaluation (Signed)
Anesthesia Post Note  Patient: Megan Colon  Procedure(s) Performed: Procedure(s) (LRB): CESAREAN SECTION (N/A)     Anesthesia Post Evaluation  Last Vitals:  Vitals:   05/10/17 1700 05/11/17 0603  BP: 100/68 (!) 123/49  Pulse: 70 70  Resp: 18 18  Temp: 37.1 C 36.7 C    Last Pain:  Vitals:   05/11/17 1206  TempSrc:   PainSc: [place]

## 2017-07-29 ENCOUNTER — Ambulatory Visit: Payer: Medicaid Other | Admitting: Obstetrics

## 2017-10-06 ENCOUNTER — Encounter (HOSPITAL_COMMUNITY): Payer: Self-pay | Admitting: *Deleted

## 2017-10-06 ENCOUNTER — Emergency Department (HOSPITAL_COMMUNITY)
Admission: EM | Admit: 2017-10-06 | Discharge: 2017-10-06 | Disposition: A | Discharge status: Home / Self Care | Payer: No Typology Code available for payment source | Attending: Emergency Medicine | Admitting: Emergency Medicine

## 2017-10-06 DIAGNOSIS — Z5321 Procedure and treatment not carried out due to patient leaving prior to being seen by health care provider: Secondary | ICD-10-CM | POA: Insufficient documentation

## 2017-10-06 DIAGNOSIS — R51 Headache: Secondary | ICD-10-CM | POA: Diagnosis present

## 2017-10-06 NOTE — ED Notes (Signed)
Called pt for vitals x3, no answer.

## 2017-10-06 NOTE — ED Triage Notes (Signed)
Pt reports hx of migraines, having migraine that started yesterday with n/v. No acute distress is noted at triage.

## 2019-04-25 ENCOUNTER — Ambulatory Visit
Admission: EM | Admit: 2019-04-25 | Discharge: 2019-04-25 | Disposition: A | Discharge status: Home / Self Care | Payer: Medicaid Other | Attending: Physician Assistant | Admitting: Physician Assistant

## 2019-04-25 ENCOUNTER — Other Ambulatory Visit: Payer: Self-pay

## 2019-04-25 DIAGNOSIS — G43119 Migraine with aura, intractable, without status migrainosus: Secondary | ICD-10-CM

## 2019-04-25 MED ORDER — DEXAMETHASONE SODIUM PHOSPHATE 10 MG/ML IJ SOLN
10.0000 mg | Freq: Once | INTRAMUSCULAR | Status: AC
  Administered 2019-04-25: 10 mg via INTRAMUSCULAR

## 2019-04-25 MED ORDER — SUMATRIPTAN SUCCINATE 6 MG/0.5ML ~~LOC~~ SOLN
6.0000 mg | Freq: Once | SUBCUTANEOUS | Status: AC
  Administered 2019-04-25: 6 mg via SUBCUTANEOUS

## 2019-04-25 MED ORDER — KETOROLAC TROMETHAMINE 30 MG/ML IJ SOLN
30.0000 mg | Freq: Once | INTRAMUSCULAR | Status: AC
  Administered 2019-04-25: 30 mg via INTRAMUSCULAR

## 2019-04-25 MED ORDER — SUMATRIPTAN SUCCINATE 50 MG PO TABS: 50.0000 mg | ORAL_TABLET | Freq: Once | ORAL | 0 refills | Status: DC | PRN

## 2019-04-25 MED ORDER — METOCLOPRAMIDE HCL 5 MG/ML IJ SOLN
5.0000 mg | Freq: Once | INTRAMUSCULAR | Status: AC
  Administered 2019-04-25: 5 mg via INTRAMUSCULAR

## 2019-04-25 NOTE — Discharge Instructions (Signed)
Decadron, Toradol, Reglan, Imitrex injection in office today.  I have called in sumatriptan 20 tablets.  Please follow-up with PCP for further refills needed.  If experiencing worsening symptoms, worse headache of your life, nausea/vomiting, weakness, dizziness, confusion, go to the emergency department for further evaluation.

## 2019-04-25 NOTE — ED Triage Notes (Signed)
Pt c/o migraine headache since Sunday. States out of Imitrex, needs refill.

## 2019-04-25 NOTE — ED Provider Notes (Signed)
[name]    CSN: [id number] Arrival date & time: 04/25/19  1101     History   Chief Complaint Chief Complaint  Patient presents with  . Migraine    HPI Megan Colon is a 43 y.o. female.   43 year old female with history of cyclic migraines comes in with 2-day history of migraines.  Patient usually takes Imitrex with good relief, however has run out of medicine.  She has been taking ibuprofen without relief.  States usually, if does not take Imitrex, can develop to nausea, vomiting, photophobia, phonophobia.  Currently, she has a left-sided headache that is throbbing in sensation, and denies nausea, vomiting, photophobia, phonophobia at this time.  She denies recent head injury, loss of consciousness.  She does get ocular aura prior to symptom onset.  Denies current blurry vision, diplopia.  Denies confusion, weakness, dizziness.  She has been able to ambulate on her own without difficulty.     Past Medical History:  Diagnosis Date  . Medical history non-contributory     Patient Active Problem List   Diagnosis Date Noted  . Encounter for induction of labor 05/06/2017  . Gestational hypertension 05/06/2017  . Supervision of high risk pregnancy, antepartum, third trimester 04/30/2017  . Clostridium difficile diarrhea 03/18/2017  . Enteritis due to Clostridium difficile 03/14/2017  . Advanced maternal age in multigravida, unspecified trimester 11/06/2016    Past Surgical History:  Procedure Laterality Date  . CESAREAN SECTION N/A 05/08/2017   Procedure: CESAREAN SECTION;  Surgeon: Florian Buff, MD;  Location: [facility];  Service: Obstetrics;  Laterality: N/A;  . LASIK Bilateral 2006  . NO PAST SURGERIES      OB History    Gravida  6   Para  2   Term  2   Preterm      AB  3   Living  3     SAB      TAB  3   Ectopic      Multiple      Live Births  3            Home Medications    Prior to Admission medications    Medication Sig Start Date End Date Taking? Authorizing Provider  SUMAtriptan (IMITREX) 50 MG tablet Take 1 tablet (50 mg total) by mouth once as needed for migraine. May repeat in 2 hours if headache persists or recurs. 04/25/19   Ok Edwards, PA-C    Family History Family History  Problem Relation Age of Onset  . Hypertension Mother   . Diabetes Father     Social History Social History   Tobacco Use  . Smoking status: Current Some Day Smoker    Packs/day: 0.25    Types: Cigarettes    Last attempt to quit: 06/17/2016    Years since quitting: 2.8  . Smokeless tobacco: Never Used  Substance Use Topics  . Alcohol use: No  . Drug use: No     Allergies   Oxycodone hcl   Review of Systems Review of Systems  Reason unable to perform ROS: See HPI as above.     Physical Exam Triage Vital Signs ED Triage Vitals  Enc Vitals Group     BP 04/25/19 1112 (!) 150/89     Pulse Rate 04/25/19 1112 83     Resp 04/25/19 1112 18     Temp 04/25/19 1112 98.8 F (37.1 C)     Temp Source 04/25/19 1112 Oral  SpO2 04/25/19 1112 97 %     Weight --      Height --      Head Circumference --      Peak Flow --      Pain Score 04/25/19 1113 8     Pain Loc --      Pain Edu? --      Excl. in [place]? --    No data found.  Updated Vital Signs BP (!) 150/89 (BP Location: Left Arm)   Pulse 83   Temp 98.8 F (37.1 C) (Oral)   Resp 18   LMP 04/18/2019   SpO2 97%   Physical Exam Constitutional:      General: She is not in acute distress.    Appearance: She is well-developed. She is not ill-appearing, toxic-appearing or diaphoretic.  HENT:     Head: Normocephalic and atraumatic.  Eyes:     Extraocular Movements: Extraocular movements intact.     Conjunctiva/sclera: Conjunctivae normal.     Pupils: Pupils are equal, round, and reactive to light.  Pulmonary:     Effort: Pulmonary effort is normal. No accessory muscle usage, prolonged expiration or respiratory distress.  Neurological:      General: No focal deficit present.     Mental Status: She is alert and oriented to person, place, and time.     GCS: GCS eye subscore is 4. GCS verbal subscore is 5. GCS motor subscore is 6.     Cranial Nerves: Cranial nerves are intact.     Sensory: Sensation is intact.     Motor: Motor function is intact.     Coordination: Coordination is intact.     Gait: Gait is intact.      UC Treatments / Results  Labs (all labs ordered are listed, but only abnormal results are displayed) Labs Reviewed - No data to display  EKG None  Radiology No results found.  Procedures Procedures (including critical care time)  Medications Ordered in UC Medications  metoCLOPramide (REGLAN) injection 5 mg (has no administration in time range)  dexamethasone (DECADRON) injection 10 mg (has no administration in time range)  SUMAtriptan (IMITREX) injection 6 mg (has no administration in time range)  ketorolac (TORADOL) 30 MG/ML injection 30 mg (has no administration in time range)    Initial Impression / Assessment and Plan / UC Course  I have reviewed the triage vital signs and the nursing notes.  Pertinent labs & imaging results that were available during my care of the patient were reviewed by me and considered in my medical decision making (see chart for details).    Neurology exam grossly intact. Decadron, toradol, reglan, imitrex infection in office today. Refilled sumatriptan. Patient with PCP appointment in 1 week, to follow up as scheduled for further management needed.  Final Clinical Impressions(s) / UC Diagnoses   Final diagnoses:  Intractable migraine with aura without status migrainosus    ED Prescriptions    Medication Sig Dispense Auth. Provider   SUMAtriptan (IMITREX) 50 MG tablet Take 1 tablet (50 mg total) by mouth once as needed for migraine. May repeat in 2 hours if headache persists or recurs. 20 tablet Tobin Chad, [place] 04/25/19 1141

## 2019-05-01 ENCOUNTER — Other Ambulatory Visit: Payer: Self-pay

## 2019-05-01 ENCOUNTER — Telehealth (INDEPENDENT_AMBULATORY_CARE_PROVIDER_SITE_OTHER): Payer: Medicaid Other | Admitting: Registered Nurse

## 2019-05-01 DIAGNOSIS — Z716 Tobacco abuse counseling: Secondary | ICD-10-CM

## 2019-05-01 DIAGNOSIS — G43829 Menstrual migraine, not intractable, without status migrainosus: Secondary | ICD-10-CM | POA: Diagnosis not present

## 2019-05-01 MED ORDER — ONDANSETRON HCL 4 MG PO TABS: 4.0000 mg | ORAL_TABLET | Freq: Three times a day (TID) | ORAL | 0 refills | Status: AC | PRN

## 2019-05-01 MED ORDER — VARENICLINE TARTRATE 0.5 MG PO TABS: 0.5000 mg | ORAL_TABLET | Freq: Two times a day (BID) | ORAL | 2 refills | Status: AC

## 2019-05-01 MED ORDER — PROPRANOLOL HCL 40 MG PO TABS: 40.0000 mg | ORAL_TABLET | Freq: Every day | ORAL | 0 refills | Status: DC

## 2019-05-01 MED ORDER — SUMATRIPTAN-NAPROXEN SODIUM 85-500 MG PO TABS: 1.0000 | ORAL_TABLET | Freq: Two times a day (BID) | ORAL | 0 refills | Status: DC

## 2019-05-01 NOTE — Progress Notes (Signed)
Spoke with pt this Morning and she informed me she needs to Est. Care with a PCP to manage her migrans. Pt states they are frequent and the medication prescribed is not helping. Pt states she would like to possibly try something different.

## 2019-05-01 NOTE — Patient Instructions (Signed)
Migraine Headache A migraine headache is an intense, throbbing pain on one side or both sides of the head. Migraines may also cause other symptoms, such as nausea, vomiting, and sensitivity to light and noise. What are the causes? Doing or taking certain things may also trigger migraines, such as:  Alcohol.  Smoking.  Medicines, such as: ? Medicine used to treat chest pain (nitroglycerine). ? Birth control pills. ? Estrogen pills. ? Certain blood pressure medicines.  Aged cheeses, chocolate, or caffeine.  Foods or drinks that contain nitrates, glutamate, aspartame, or tyramine.  Physical activity. Other things that may trigger a migraine include:  Menstruation.  Pregnancy.  Hunger.  Stress, lack of sleep, too much sleep, or fatigue.  Weather changes. What increases the risk? The following factors may make you more likely to experience migraine headaches:  Age. Risk increases with age.  Family history of migraine headaches.  Being Caucasian.  Depression and anxiety.  Obesity.  Being a woman.  Having a hole in the heart (patent foramen ovale) or other heart problems. What are the signs or symptoms? The main symptom of this condition is pulsating or throbbing pain. Pain may:  Happen in any area of the head, such as on one side or both sides.  Interfere with daily activities.  Get worse with physical activity.  Get worse with exposure to bright lights or loud noises. Other symptoms may include:  Nausea.  Vomiting.  Dizziness.  General sensitivity to bright lights, loud noises, or smells. Before you get a migraine, you may get warning signs that a migraine is developing (aura). An aura may include:  Seeing flashing lights or having blind spots.  Seeing bright spots, halos, or zigzag lines.  Having tunnel vision or blurred vision.  Having numbness or a tingling feeling.  Having trouble talking.  Having muscle weakness. How is this diagnosed? A  migraine headache can be diagnosed based on:  Your symptoms.  A physical exam.  Tests, such as CT scan or MRI of the head. These imaging tests can help rule out other causes of headaches.  Taking fluid from the spine (lumbar puncture) and analyzing it (cerebrospinal fluid analysis, or CSF analysis). How is this treated? A migraine headache is usually treated with medicines that:  Relieve pain.  Relieve nausea.  Prevent migraines from coming back. Treatment may also include:  Acupuncture.  Lifestyle changes like avoiding foods that trigger migraines. Follow these instructions at home: Medicines  Take over-the-counter and prescription medicines only as told by your health care provider.  Do not drive or use heavy machinery while taking prescription pain medicine.  To prevent or treat constipation while you are taking prescription pain medicine, your health care provider may recommend that you: ? Drink enough fluid to keep your urine clear or pale yellow. ? Take over-the-counter or prescription medicines. ? Eat foods that are high in fiber, such as fresh fruits and vegetables, whole grains, and beans. ? Limit foods that are high in fat and processed sugars, such as fried and sweet foods. Lifestyle  Avoid alcohol use.  Do not use any products that contain nicotine or tobacco, such as cigarettes and e-cigarettes. If you need help quitting, ask your health care provider.  Get at least 8 hours of sleep every night.  Limit your stress. General instructions      Keep a journal to find out what may trigger your migraine headaches. For example, write down: ? What you eat and drink. ? How much sleep you  get. ? Any change to your diet or medicines.  If you have a migraine: ? Avoid things that make your symptoms worse, such as bright lights. ? It may help to lie down in a dark, quiet room. ? Do not drive or use heavy machinery. ? Ask your health care provider what  activities are safe for you while you are experiencing symptoms.  Keep all follow-up visits as told by your health care provider. This is important. Contact a health care provider if:  You develop symptoms that are different or more severe than your usual migraine symptoms. Get help right away if:  Your migraine becomes severe.  You have a fever.  You have a stiff neck.  You have vision loss.  Your muscles feel weak or like you cannot control them.  You start to lose your balance often.  You develop trouble walking.  You faint. This information is not intended to replace advice given to you by your health care provider. Make sure you discuss any questions you have with your health care provider. Document Released: 12/07/2005 Document Revised: 06/26/2016 Document Reviewed: 05/25/2016 Elsevier Interactive Patient Education  2019 Elsevier Inc[employer] Sumatriptan tablets What is this medicine? SUMATRIPTAN (soo ma TRIP tan) is used to treat migraines with or without aura. An aura is a strange feeling or visual disturbance that warns you of an attack. It is not used to prevent migraines. This medicine may be used for other purposes; ask your health care provider or pharmacist if you have questions. COMMON BRAND NAME(S): Imitrex, Migraine Pack What should I tell my health care provider before I take this medicine? They need to know if you have any of these conditions: -cigarette smoker -circulation problems in fingers and toes -diabetes -heart disease -high blood pressure -high cholesterol -history of irregular heartbeat -history of stroke -kidney disease -liver disease -stomach or intestine problems -an unusual or allergic reaction to sumatriptan, other medicines, foods, dyes, or preservatives -pregnant or trying to get pregnant -breast-feeding How should I use this medicine? Take this medicine by mouth with a glass of water. Follow the directions on the prescription label. Do not  take it more often than directed. Talk to your pediatrician regarding the use of this medicine in children. Special care may be needed. Overdosage: If you think you have taken too much of this medicine contact a poison control center or emergency room at once. NOTE: This medicine is only for you. Do not share this medicine with others. What if I miss a dose? This does not apply. This medicine is not for regular use. What may interact with this medicine? Do not take this medicine with any of the following medicines: -certain medicines for migraine headache like almotriptan, eletriptan, frovatriptan, naratriptan, rizatriptan, sumatriptan, zolmitriptan -ergot alkaloids like dihydroergotamine, ergonovine, ergotamine, methylergonovine -MAOIs like Carbex, Eldepryl, Marplan, Nardil, and Parnate This medicine may also interact with the following medications: -certain medicines for depression, anxiety, or psychotic disorders This list may not describe all possible interactions. Give your health care provider a list of all the medicines, herbs, non-prescription drugs, or dietary supplements you use. Also tell them if you smoke, drink alcohol, or use illegal drugs. Some items may interact with your medicine. What should I watch for while using this medicine? Visit your healthcare professional for regular checks on your progress. Tell your healthcare professional if your symptoms do not start to get better or if they get worse. You may get drowsy or dizzy. Do not drive, use machinery,  or do anything that needs mental alertness until you know how this medicine affects you. Do not stand up or sit up quickly, especially if you are an older patient. This reduces the risk of dizzy or fainting spells. Alcohol may interfere with the effect of this medicine. Tell your healthcare professional right away if you have any change in your eyesight. If you take migraine medicines for 10 or more days a month, your migraines  may get worse. Keep a diary of headache days and medicine use. Contact your healthcare professional if your migraine attacks occur more frequently. What side effects may I notice from receiving this medicine? Side effects that you should report to your doctor or health care professional as soon as possible: -allergic reactions like skin rash, itching or hives, swelling of the face, lips, or tongue -changes in vision -chest pain or chest tightness -signs and symptoms of a dangerous change in heartbeat or heart rhythm like chest pain; dizziness; fast, irregular heartbeat; palpitations; feeling faint or lightheaded; falls; breathing problems -signs and symptoms of a stroke like changes in vision; confusion; trouble speaking or understanding; severe headaches; sudden numbness or weakness of the face, arm or leg; trouble walking; dizziness; loss of balance or coordination -signs and symptoms of serotonin syndrome like irritable; confusion; diarrhea; fast or irregular heartbeat; muscle twitching; stiff muscles; trouble walking; sweating; high fever; seizures; chills; vomiting Side effects that usually do not require medical attention (report to your doctor or health care professional if they continue or are bothersome): -diarrhea -dizziness -drowsiness -dry mouth -headache -nausea, vomiting -pain, tingling, numbness in the hands or feet -stomach pain This list may not describe all possible side effects. Call your doctor for medical advice about side effects. You may report side effects to FDA at 1-800-FDA-1088. Where should I keep my medicine? Keep out of the reach of children. Store at room temperature between 2 and 30 degrees C (36 and 86 degrees F). Throw away any unused medicine after the expiration date. NOTE: This sheet is a summary. It may not cover all possible information. If you have questions about this medicine, talk to your doctor, pharmacist, or health care provider.  2019  Elsevier/Gold Standard (2018-06-21 15:05:37)  Recurrent Migraine Headache  Migraines are a type of headache, and they are usually stronger and more sudden than normal headaches (tension headaches). Migraines are characterized by an intense pulsing, throbbing pain that is usually only present on one side of the head. Sometimes, migraine headaches can cause nausea, vomiting, sensitivity to light and sound, and vision changes. Recurrent migraines keep coming back (recurring). A migraine can last from 4 hours up to 3 days. What are the causes? The exact cause of this condition is not known. However, a migraine may be caused when nerves in the brain become irritated and release chemicals that cause inflammation of blood vessels. This inflammation causes pain. Certain things may also trigger migraines, such as:  A disruption in your regular eating and sleeping schedule.  Smoking.  Stress.  Menstruation.  Certain foods and drinks, such as: ? Aged cheese. ? Chocolate. ? Alcohol. ? Caffeine. ? Foods or drinks that contain nitrates, glutamate, aspartame, MSG, or tyramine.  Lack of sleep.  Hunger.  Physical exertion.  Fatigue.  High altitude.  Weather changes.  Medicines, such as: ? Nitroglycerin, which is used to treat chest pain. ? Birth control pills. ? Estrogen. ? Some blood pressure medicines. What are the signs or symptoms? Symptoms of this condition vary for each  person and may include:  Pain that is usually only present on one side of the head. In some cases, the pain may be on both sides of the head or around the head or neck.  Pulsating or throbbing pain.  Severe pain that prevents daily activities.  Pain that is aggravated by any physical activity.  Nausea, vomiting, or both.  Dizziness.  Pain with exposure to bright lights, loud noises, or activity.  General sensitivity to bright lights, loud noises, or smells. Before you get a migraine, you may get warning  signs that a migraine is coming (aura). An aura may include:  Seeing flashing lights.  Seeing bright spots, halos, or zigzag lines.  Having tunnel vision or blurred vision.  Having numbness or a tingling feeling.  Having trouble talking.  Having muscle weakness.  Smelling a certain odor. How is this diagnosed? This condition is often diagnosed based on:  Your symptoms and medical history.  A physical exam. You may also have tests, including:  A CT scan or MRI of your brain. These imaging tests cannot diagnose migraines, but they can help to rule out other causes of headaches.  Blood tests. How is this treated? This condition is treated with:  Medicines. These are used for: ? Lessening pain and nausea. ? Preventing recurrent migraines.  Lifestyle changes, such as changes to your diet or sleeping patterns.  Behavior therapy, such as relaxation training or biofeedback. Biofeedback is a treatment that involves teaching you to relax and use your brain to lower your heart rate and control your breathing. Follow these instructions at home: Medicines  Take over-the-counter and prescription medicines only as told by your health care provider.  Do not drive or use heavy machinery while taking prescription pain medicine. Lifestyle  Do not use any products that contain nicotine or tobacco, such as cigarettes and e-cigarettes. If you need help quitting, ask your health care provider.  Limit alcohol intake to no more than 1 drink a day for nonpregnant women and 2 drinks a day for men. One drink equals 12 oz of beer, 5 oz of wine, or 1 oz of hard liquor.  Get 7-9 hours of sleep each night, or the amount of sleep recommended by your health care provider.  Limit your stress. Talk with your health care provider if you need help with stress management.  Maintain a healthy weight. If you need help losing weight, ask your health care provider.  Exercise regularly. Aim for 150  minutes of moderate-intensity exercise (walking, biking, yoga) or 75 minutes of vigorous exercise (running, circuit training, swimming) each week. General instructions   Keep a journal to find out what triggers your migraine headaches so you can avoid these triggers. For example, write down: ? What you eat and drink. ? How much sleep you get. ? Any change to your diet or medicines.  Lie down in a dark, quiet room when you have a migraine.  Try placing a cool towel over your head when you have a migraine.  Keep lights dim, if bright lights bother you and make your migraines worse.  Keep all follow-up visits as told by your health care provider. This is important. Contact a health care provider if:  Your pain does not improve, even with medicine.  Your migraines continue to return, even with medicine.  You have a fever.  You have weight loss. Get help right away if:  Your migraine becomes severe and medicine does not help.  You have a  stiff neck.  You have a loss of vision.  You have muscle weakness or loss of muscle control.  You start losing your balance or have trouble walking.  You feel faint or you pass out.  You develop new, severe symptoms.  You start having abrupt severe headaches that last for a second or less, like a thunderclap. Summary  Migraine headaches are usually stronger and more sudden than normal headaches (tension headaches). Migraines are characterized by an intense pulsing, throbbing pain that is usually only present on one side of the head.  The exact cause of this condition is not known. However, a migraine may be caused when nerves in the brain become irritated and release chemicals that cause inflammation of blood vessels.  Certain things may trigger migraines, such as changes to diet or sleeping patterns, smoking, certain foods, alcohol, stress, and certain medicines.  Sometimes, migraine headaches can cause nausea, vomiting, sensitivity to  light and sound, and vision changes.  Migraines are often diagnosed based on your symptoms, medical history, and a physical exam. This information is not intended to replace advice given to you by your health care provider. Make sure you discuss any questions you have with your health care provider. Document Released: 09/01/2001 Document Revised: 09/18/2016 Document Reviewed: 09/18/2016 Elsevier Interactive Patient Education  2019 [employer].

## 2019-05-01 NOTE — Progress Notes (Signed)
Telemedicine Encounter- SOAP NOTE Established Patient  This telephone encounter was conducted with the patient's (or proxy's) verbal consent via audio telecommunications: yes   Patient was instructed to have this encounter in a suitably private space; and to only have persons present to whom they give permission to participate. In addition, patient identity was confirmed by use of name plus two identifiers (DOB and address).  I discussed the limitations, risks, security and privacy concerns of performing an evaluation and management service by telephone and the availability of in person appointments. I also discussed with the patient that there may be a patient responsible charge related to this service. The patient expressed understanding and agreed to proceed.  I spent a total of 18 minutes talking with the patient or their proxy.  CC: Establish care, migraine headaches, quit smoking  Subjective   Megan Colon is a 43 y.o. established patient. Telephone visit today to establish care with myself as PCP, migraine headaches, smoking cessation.  Pt flexed to problem visit - I am listed as PCP but will do full TOC visit at later date.  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 She has been on sumatriptan abortive therapy in the past with good effect, however, during her most recent cycle she ran out of this medication and was seen at urgent care.  During her migraines, she experiences phonophobia, photophobia, NV, and pain. Denies aura. She finds them to be predictable but have a negative effect on her daily life. Denies CP, numbness/tingling in extremities, weakness, change in vision, SOB during episodes. She has regular menstrual cycles, not using BCM, not breastfeeding. Pt interested in using Depo-Provera in future if possible. She is not currently experiencing symptoms.  Smoking: Pt smokes 0.5-1 ppd since age 45 (estimated 90 pack year history) and is  interested in quitting smoking at this time. She has tried before without luck, but states that her life has become less stressful and she feels she is in a good place to quit. She had heard about Chantix from a friend and is interested in trying this.     Patient Active Problem List   Diagnosis Date Noted  . Encounter for induction of labor 05/06/2017  . Gestational hypertension 05/06/2017  . Supervision of high risk pregnancy, antepartum, third trimester 04/30/2017  . Clostridium difficile diarrhea 03/18/2017  . Enteritis due to Clostridium difficile 03/14/2017  . Advanced maternal age in multigravida, unspecified trimester 11/06/2016    Past Medical History:  Diagnosis Date  . Medical history non-contributory     Current Outpatient Medications  Medication Sig Dispense Refill  . SUMAtriptan (IMITREX) 50 MG tablet Take 1 tablet (50 mg total) by mouth once as needed for migraine. May repeat in 2 hours if headache persists or recurs. 20 tablet 0  . ondansetron (ZOFRAN) 4 MG tablet Take 1 tablet (4 mg total) by mouth every 8 (eight) hours as needed for up to 15 days for nausea or vomiting. 30 tablet 0  . propranolol (INDERAL) 40 MG tablet Take 1 tablet (40 mg total) by mouth daily. 90 tablet 0  . SUMAtriptan-naproxen (TREXIMET) 85-500 MG tablet Take 1 tablet by mouth 2 (two) times a day for 15 days. Take second dose no sooner than 2h after first dose. Max 2 doses per day 30 tablet 0  . varenicline (CHANTIX) 0.5 MG tablet Take 1 tablet (0.5 mg total) by mouth 2 (two) times daily. 60 tablet 2   No current  facility-administered medications for this visit.     Allergies  Allergen Reactions  . Oxycodone Hcl Itching    Social History   Socioeconomic History  . Marital status: Single    Spouse name: Not on file  . Number of children: Not on file  . Years of education: Not on file  . Highest education level: Not on file  Occupational History  . Not on file  Social Needs  .  Financial resource strain: Not on file  . Food insecurity:    Worry: Not on file    Inability: Not on file  . Transportation needs:    Medical: Not on file    Non-medical: Not on file  Tobacco Use  . Smoking status: Current Some Day Smoker    Packs/day: 0.25    Types: Cigarettes    Last attempt to quit: 06/17/2016    Years since quitting: 2.8  . Smokeless tobacco: Never Used  Substance and Sexual Activity  . Alcohol use: No  . Drug use: No  . Sexual activity: Yes    Birth control/protection: None    Comment: plans pills after delivery  Lifestyle  . Physical activity:    Days per week: Not on file    Minutes per session: Not on file  . Stress: Not on file  Relationships  . Social connections:    Talks on phone: Not on file    Gets together: Not on file    Attends religious service: Not on file    Active member of club or organization: Not on file    Attends meetings of clubs or organizations: Not on file    Relationship status: Not on file  . Intimate partner violence:    Fear of current or ex partner: Not on file    Emotionally abused: Not on file    Physically abused: Not on file    Forced sexual activity: Not on file  Other Topics Concern  . Not on file  Social History Narrative  . Not on file    Review of Systems  Constitutional: Negative for chills, fever and malaise/fatigue.  Eyes: Negative for blurred vision, photophobia and pain.  Respiratory: Negative for cough and shortness of breath.   Cardiovascular: Negative for chest pain and palpitations.  Gastrointestinal: Negative for abdominal pain, diarrhea, nausea and vomiting.  Neurological: Negative for dizziness, tingling, focal weakness, loss of consciousness, weakness and headaches.    Objective   Vitals as reported by the patient: There were no vitals filed for this visit.  Diagnoses and all orders for this visit:  Menstrual migraine without status migrainosus, not intractable -      SUMAtriptan-naproxen (TREXIMET) 85-500 MG tablet; Take 1 tablet by mouth 2 (two) times a day for 15 days. Take second dose no sooner than 2h after first dose. Max 2 doses per day -     propranolol (INDERAL) 40 MG tablet; Take 1 tablet (40 mg total) by mouth daily. -     ondansetron (ZOFRAN) 4 MG tablet; Take 1 tablet (4 mg total) by mouth every 8 (eight) hours as needed for up to 15 days for nausea or vomiting.  Encounter for smoking cessation counseling -     varenicline (CHANTIX) 0.5 MG tablet; Take 1 tablet (0.5 mg total) by mouth 2 (two) times daily.  PLAN:  Pt to start propranolol 40mg  PO qd for migraine prophylaxis. No hx of hypotension. Reviewed risks and benefits. Discussed sxs of hypotension and reasons to call clinic  Sumatriptan-naproxen 85-500mg  PO PRN for migraine relief. Take 1 pill at onset of migraine. If needed, take another dose 2 hours later. MAX 2 pills per 24 hour period. Discussed side effects and reasons to call clinic  Ondansetron 4mg  PO q8h PRN for NV r/t migraine  Varenicline for smoking cessation  Discussed theory behind prophylactic therapy for predictable migraine onset and symptoms - pt understands that this is 48mo trial of therapy and next steps likely include controlling menstrual cycle.   Discussed nonpharm: hydration, exercise, diet all important for both smoking cessation and migraine. Pt does not consume alcohol.   Pt counseled on smoking cessation.  Follow up in 3 mos to check on migraine therapy and smoking cessation status.   Patient encouraged to call clinic with any questions, comments, or concerns.   I discussed the assessment and treatment plan with the patient. The patient was provided an opportunity to ask questions and all were answered. The patient agreed with the plan and demonstrated an understanding of the instructions.   The patient was advised to call back or seek an in-person evaluation if the symptoms worsen or if the condition fails  to improve as anticipated.  I provided 18 minutes of non-face-to-face time during this encounter.  Maximiano Coss, NP  Primary Care at [facility]

## 2019-06-19 ENCOUNTER — Encounter (HOSPITAL_COMMUNITY): Payer: Self-pay | Admitting: Emergency Medicine

## 2019-06-19 ENCOUNTER — Ambulatory Visit (HOSPITAL_COMMUNITY)
Admission: EM | Admit: 2019-06-19 | Discharge: 2019-06-19 | Disposition: A | Discharge status: Home / Self Care | Payer: Medicaid Other | Attending: Family Medicine | Admitting: Family Medicine

## 2019-06-19 ENCOUNTER — Other Ambulatory Visit: Payer: Self-pay

## 2019-06-19 DIAGNOSIS — G43009 Migraine without aura, not intractable, without status migrainosus: Secondary | ICD-10-CM

## 2019-06-19 MED ORDER — SUMATRIPTAN SUCCINATE 6 MG/0.5ML ~~LOC~~ SOLN
6.0000 mg | Freq: Once | SUBCUTANEOUS | Status: AC
  Administered 2019-06-19: 6 mg via SUBCUTANEOUS

## 2019-06-19 MED ORDER — KETOROLAC TROMETHAMINE 60 MG/2ML IM SOLN
INTRAMUSCULAR | Status: AC
  Filled 2019-06-19: qty 2

## 2019-06-19 MED ORDER — SUMATRIPTAN SUCCINATE 6 MG/0.5ML ~~LOC~~ SOLN
SUBCUTANEOUS | Status: AC
  Filled 2019-06-19: qty 0.5

## 2019-06-19 MED ORDER — SUMATRIPTAN SUCCINATE 100 MG PO TABS: 100.0000 mg | ORAL_TABLET | ORAL | 0 refills | Status: DC | PRN

## 2019-06-19 MED ORDER — METOCLOPRAMIDE HCL 5 MG/ML IJ SOLN
INTRAMUSCULAR | Status: AC
  Filled 2019-06-19: qty 2

## 2019-06-19 MED ORDER — NAPROXEN 500 MG PO TABS: 500.0000 mg | ORAL_TABLET | Freq: Two times a day (BID) | ORAL | 0 refills | Status: DC

## 2019-06-19 MED ORDER — DEXAMETHASONE SODIUM PHOSPHATE 10 MG/ML IJ SOLN
10.0000 mg | Freq: Once | INTRAMUSCULAR | Status: AC
  Administered 2019-06-19: 10 mg via INTRAMUSCULAR

## 2019-06-19 MED ORDER — SUMATRIPTAN SUCCINATE 50 MG PO TABS: 50.0000 mg | ORAL_TABLET | ORAL | 0 refills | Status: DC | PRN

## 2019-06-19 MED ORDER — DEXAMETHASONE SODIUM PHOSPHATE 10 MG/ML IJ SOLN
INTRAMUSCULAR | Status: AC
  Filled 2019-06-19: qty 1

## 2019-06-19 MED ORDER — METOCLOPRAMIDE HCL 5 MG/ML IJ SOLN
5.0000 mg | Freq: Once | INTRAMUSCULAR | Status: AC
  Administered 2019-06-19: 5 mg via INTRAMUSCULAR

## 2019-06-19 MED ORDER — KETOROLAC TROMETHAMINE 60 MG/2ML IM SOLN
60.0000 mg | Freq: Once | INTRAMUSCULAR | Status: AC
  Administered 2019-06-19: 60 mg via INTRAMUSCULAR

## 2019-06-19 NOTE — ED Triage Notes (Signed)
Pt here for migraine HA; pt sts unable to get her imitrex refilled due to insurance issues

## 2019-06-19 NOTE — ED Provider Notes (Signed)
[name]    CSN: [id number] Arrival date & time: 06/19/19  1035      History   Chief Complaint Chief Complaint  Patient presents with   Headache    HPI Megan Colon is a 43 y.o. female history of migraines, presenting today for evaluation of a headache.  Patient states that over the past week she has had a headache that is located on the left side and wraps around into her neck.  States it is been off and on, but is worse today.  States that this feels very similar to her typical headaches.  Typically has headaches after on her my menstrual cycle.  Menstrual cycle ended 6/24 which is when her headaches began.  Typically has similar headaches once a month associated with cycle.  She has been using Excedrin without relief.  She notes that she no longer has refills of Imitrex which was previously prescribed by PCP.  She states that her PCP no longer accepts Medicaid and currently does not have primary care.  She denies vision changes other than some slight blurring which is typical for her headaches.  She does endorse some associated nausea, but denies vomiting.  Denies chest pain or shortness of breath.  Denies difficulty speaking or weakness.  She has had previous remote CTs that have been negative, but denies any evaluation by neurology.  HPI  Past Medical History:  Diagnosis Date   Medical history non-contributory     Patient Active Problem List   Diagnosis Date Noted   Encounter for induction of labor 05/06/2017   Gestational hypertension 05/06/2017   Supervision of high risk pregnancy, antepartum, third trimester 04/30/2017   Clostridium difficile diarrhea 03/18/2017   Enteritis due to Clostridium difficile 03/14/2017   Advanced maternal age in multigravida, unspecified trimester 11/06/2016    Past Surgical History:  Procedure Laterality Date   CESAREAN SECTION N/A 05/08/2017   Procedure: CESAREAN SECTION;  Surgeon: Florian Buff, MD;  Location: [place];  Service: Obstetrics;  Laterality: N/A;   LASIK Bilateral 2006   NO PAST SURGERIES      OB History    Gravida  6   Para  2   Term  2   Preterm      AB  3   Living  3     SAB      TAB  3   Ectopic      Multiple      Live Births  3            Home Medications    Prior to Admission medications   Medication Sig Start Date End Date Taking? Authorizing Provider  naproxen (NAPROSYN) 500 MG tablet Take 1 tablet (500 mg total) by mouth 2 (two) times daily. 06/19/19   Sherman Lipuma C, PA-C  propranolol (INDERAL) 40 MG tablet Take 1 tablet (40 mg total) by mouth daily. 05/01/19   Maximiano Coss, NP  SUMAtriptan (IMITREX) 100 MG tablet Take 1 tablet (100 mg total) by mouth every 2 (two) hours as needed for migraine. May repeat in 2 hours if headache persists or recurs. No more than 2 tablets in 24 hours 06/19/19   Raylene Everts, MD  SUMAtriptan-naproxen (TREXIMET) 85-500 MG tablet Take 1 tablet by mouth 2 (two) times a day for 15 days. Take second dose no sooner than 2h after first dose. Max 2 doses per day 05/01/19 05/16/19  Maximiano Coss, NP  varenicline (CHANTIX) 0.5 MG tablet Take  1 tablet (0.5 mg total) by mouth 2 (two) times daily. 05/01/19 07/30/19  Maximiano Coss, NP    Family History Family History  Problem Relation Age of Onset   Hypertension Mother    Diabetes Father     Social History Social History   Tobacco Use   Smoking status: Current Some Day Smoker    Packs/day: 0.25    Types: Cigarettes    Last attempt to quit: 06/17/2016    Years since quitting: 3.0   Smokeless tobacco: Never Used  Substance Use Topics   Alcohol use: No   Drug use: No     Allergies   Oxycodone hcl   Review of Systems Review of Systems  Constitutional: Negative for fatigue and fever.  HENT: Negative for congestion, sinus pressure and sore throat.   Eyes: Negative for photophobia, pain and visual disturbance.  Respiratory: Negative for  cough and shortness of breath.   Cardiovascular: Negative for chest pain.  Gastrointestinal: Negative for abdominal pain, nausea and vomiting.  Genitourinary: Negative for decreased urine volume and hematuria.  Musculoskeletal: Positive for neck pain. Negative for myalgias and neck stiffness.  Neurological: Positive for headaches. Negative for dizziness, syncope, facial asymmetry, speech difficulty, weakness, light-headedness and numbness.     Physical Exam Triage Vital Signs ED Triage Vitals [06/19/19 1114]  Enc Vitals Group     BP (!) 156/99     Pulse Rate 80     Resp 18     Temp 98.7 F (37.1 C)     Temp Source Oral     SpO2 100 %     Weight      Height      Head Circumference      Peak Flow      Pain Score 8     Pain Loc      Pain Edu?      Excl. in [place]?    No data found.  Updated Vital Signs BP (!) 156/99 (BP Location: Right Arm)    Pulse 80    Temp 98.7 F (37.1 C) (Oral)    Resp 18    SpO2 100%   Visual Acuity Right Eye Distance:   Left Eye Distance:   Bilateral Distance:    Right Eye Near:   Left Eye Near:    Bilateral Near:     Physical Exam Vitals signs and nursing note reviewed.  Constitutional:      General: She is not in acute distress.    Appearance: She is well-developed.  HENT:     Head: Normocephalic and atraumatic.     Mouth/Throat:     Comments: Oral mucosa pink and moist, no tonsillar enlargement or exudate. Posterior pharynx patent and nonerythematous, no uvula deviation or swelling. Normal phonation. Palate elevating symmetrically Eyes:     Extraocular Movements: Extraocular movements intact.     Conjunctiva/sclera: Conjunctivae normal.     Pupils: Pupils are equal, round, and reactive to light.  Neck:     Musculoskeletal: Normal range of motion and neck supple.     Comments: Nontender to palpation along cervical musculature Cardiovascular:     Rate and Rhythm: Normal rate and regular rhythm.     Heart sounds: No murmur.  Pulmonary:      Effort: Pulmonary effort is normal. No respiratory distress.     Breath sounds: Normal breath sounds.     Comments: Breathing comfortably at rest, CTABL, no wheezing, rales or other adventitious sounds auscultated  Abdominal:  Palpations: Abdomen is soft.     Tenderness: There is no abdominal tenderness.  Skin:    General: Skin is warm and dry.  Neurological:     Mental Status: She is alert.     GCS: GCS eye subscore is 4. GCS verbal subscore is 5. GCS motor subscore is 6.     Comments: Patient A&O x3, cranial nerves II-XII grossly intact, strength at shoulders, hips and knees 5/5, equal bilaterally. Gait without abnormality.       UC Treatments / Results  Labs (all labs ordered are listed, but only abnormal results are displayed) Labs Reviewed - No data to display  EKG None  Radiology No results found.  Procedures Procedures (including critical care time)  Medications Ordered in UC Medications  ketorolac (TORADOL) injection 60 mg (60 mg Intramuscular Given 06/19/19 1200)  metoCLOPramide (REGLAN) injection 5 mg (5 mg Intramuscular Given 06/19/19 1203)  dexamethasone (DECADRON) injection 10 mg (10 mg Intramuscular Given 06/19/19 1202)  SUMAtriptan (IMITREX) injection 6 mg (6 mg Subcutaneous Given 06/19/19 1205)  dexamethasone (DECADRON) 10 MG/ML injection (has no administration in time range)  ketorolac (TORADOL) 60 MG/2ML injection (has no administration in time range)  metoCLOPramide (REGLAN) 5 MG/ML injection (has no administration in time range)  SUMAtriptan (IMITREX) 6 MG/0.5ML injection (has no administration in time range)    Initial Impression / Assessment and Plan / UC Course  I have reviewed the triage vital signs and the nursing notes.  Pertinent labs & imaging results that were available during my care of the patient were reviewed by me and considered in my medical decision making (see chart for details).     Patient presenting with 1 week of a  headache.  Unrelieved with over-the-counter medicine.  Similar to previous headaches.  No neuro deficits.  Will provide headache cocktail of Toradol, Decadron Reglan and Imitrex.  Also recommended Benadryl use at home.  Naprosyn for further headache management, did refill 10 tablets of Imitrex.  Provided contact for [facility] as option for PCP.Discussed strict return precautions. Patient verbalized understanding and is agreeable with plan.  Final Clinical Impressions(s) / UC Diagnoses   Final diagnoses:  Migraine without aura and without status migrainosus, not intractable     Discharge Instructions     We gave you a shot of Toradol, Decadron, Reglan and Imitrex today for your headache. May use Naprosyn twice daily with food for further headache management at home I provided you with 10 tablets of Imitrex to use sparingly.  Do not take more than 2 tablets within 24 hours  If your headache is different from your typical migraines, develop weakness, difficulty speaking, vision changes please follow-up in emergency room   ED Prescriptions    Medication Sig Dispense Auth. Provider   naproxen (NAPROSYN) 500 MG tablet Take 1 tablet (500 mg total) by mouth 2 (two) times daily. 30 tablet Joannie Medine C, PA-C   SUMAtriptan (IMITREX) 50 MG tablet  (Status: Discontinued) Take 1 tablet (50 mg total) by mouth every 2 (two) hours as needed for migraine. May repeat in 2 hours if headache persists or recurs. No more than 2 tablets in 24 hours 10 tablet Inez Stantz C, PA-C   SUMAtriptan (IMITREX) 100 MG tablet Take 1 tablet (100 mg total) by mouth every 2 (two) hours as needed for migraine. May repeat in 2 hours if headache persists or recurs. No more than 2 tablets in 24 hours 10 tablet Raylene Everts, MD  Controlled Substance Prescriptions [place] Controlled Substance Registry consulted? Not Applicable   Janith Lima, [place] 06/19/19 1915

## 2019-06-19 NOTE — Discharge Instructions (Signed)
We gave you a shot of Toradol, Decadron, Reglan and Imitrex today for your headache. May use Naprosyn twice daily with food for further headache management at home I provided you with 10 tablets of Imitrex to use sparingly.  Do not take more than 2 tablets within 24 hours  If your headache is different from your typical migraines, develop weakness, difficulty speaking, vision changes please follow-up in emergency room

## 2019-08-07 ENCOUNTER — Ambulatory Visit
Admission: EM | Admit: 2019-08-07 | Discharge: 2019-08-07 | Disposition: A | Discharge status: Home / Self Care | Payer: Medicaid Other | Attending: Physician Assistant | Admitting: Physician Assistant

## 2019-08-07 ENCOUNTER — Other Ambulatory Visit: Payer: Self-pay

## 2019-08-07 ENCOUNTER — Encounter: Payer: Self-pay | Admitting: Emergency Medicine

## 2019-08-07 DIAGNOSIS — G43019 Migraine without aura, intractable, without status migrainosus: Secondary | ICD-10-CM | POA: Diagnosis not present

## 2019-08-07 MED ORDER — FROVATRIPTAN SUCCINATE 2.5 MG PO TABS: 2.5000 mg | ORAL_TABLET | ORAL | 0 refills | Status: DC | PRN

## 2019-08-07 MED ORDER — RIZATRIPTAN BENZOATE 5 MG PO TABS: 5.0000 mg | ORAL_TABLET | ORAL | 0 refills | Status: DC | PRN

## 2019-08-07 NOTE — ED Provider Notes (Signed)
[name]    CSN: [id number] Arrival date & time: 08/07/19  1620     History   Chief Complaint Chief Complaint  Patient presents with  . Headache    HPI Megan Colon is a 43 y.o. female.   43 year old female with history of migraines comes in for 1 day history of headache.  She has a history of getting migraines monthly, usually associated with her cycle.  She finished her cycle yesterday and now having headache.  Headache is right-sided, that radiates to the left.  It is throbbing in sensation.  States is not too bad right now, but has a history of developing to having photophobia, phonophobia, nausea, vomiting.  She has been on sumatriptan, and this has not been helping her migraines.  In the past, she has gotten headache cocktail including Decadron, Toradol, Reglan.  States this has not aborted her migraines either.  She would like to defer injections at this time.  She denies any head injury, loss of consciousness.  Denies weakness, dizziness, syncope.  She is in the process of looking for a primary care provider.     Past Medical History:  Diagnosis Date  . Medical history non-contributory     Patient Active Problem List   Diagnosis Date Noted  . Encounter for induction of labor 05/06/2017  . Gestational hypertension 05/06/2017  . Supervision of high risk pregnancy, antepartum, third trimester 04/30/2017  . Clostridium difficile diarrhea 03/18/2017  . Enteritis due to Clostridium difficile 03/14/2017  . Advanced maternal age in multigravida, unspecified trimester 11/06/2016    Past Surgical History:  Procedure Laterality Date  . CESAREAN SECTION N/A 05/08/2017   Procedure: CESAREAN SECTION;  Surgeon: Florian Buff, MD;  Location: [place];  Service: Obstetrics;  Laterality: N/A;  . LASIK Bilateral 2006  . NO PAST SURGERIES      OB History    Gravida  6   Para  2   Term  2   Preterm      AB  3   Living  3     SAB      TAB  3    Ectopic      Multiple      Live Births  3            Home Medications    Prior to Admission medications   Medication Sig Start Date End Date Taking? Authorizing Provider  frovatriptan (FROVA) 2.5 MG tablet Take 1 tablet (2.5 mg total) by mouth as needed for migraine. If recurs, may repeat after 2 hours. Max of 3 tabs in 24 hours. 08/07/19   Tasia Catchings, Mekesha Solomon V, PA-C  naproxen (NAPROSYN) 500 MG tablet Take 1 tablet (500 mg total) by mouth 2 (two) times daily. 06/19/19   Wieters, Hallie C, PA-C  propranolol (INDERAL) 40 MG tablet Take 1 tablet (40 mg total) by mouth daily. 05/01/19   Maximiano Coss, NP  SUMAtriptan (IMITREX) 100 MG tablet Take 1 tablet (100 mg total) by mouth every 2 (two) hours as needed for migraine. May repeat in 2 hours if headache persists or recurs. No more than 2 tablets in 24 hours 06/19/19   Raylene Everts, MD  SUMAtriptan-naproxen (TREXIMET) 85-500 MG tablet Take 1 tablet by mouth 2 (two) times a day for 15 days. Take second dose no sooner than 2h after first dose. Max 2 doses per day 05/01/19 05/16/19  Maximiano Coss, NP    Family History Family History  Problem Relation  Age of Onset  . Hypertension Mother   . Diabetes Father     Social History Social History   Tobacco Use  . Smoking status: Current Some Day Smoker    Packs/day: 0.25    Types: Cigarettes    Last attempt to quit: 06/17/2016    Years since quitting: 3.1  . Smokeless tobacco: Never Used  Substance Use Topics  . Alcohol use: No  . Drug use: No     Allergies   Oxycodone hcl   Review of Systems Review of Systems  Reason unable to perform ROS: See HPI as above.     Physical Exam Triage Vital Signs ED Triage Vitals [08/07/19 1632]  Enc Vitals Group     BP (!) 148/87     Pulse Rate 88     Resp 18     Temp 98.4 F (36.9 C)     Temp Source Oral     SpO2 98 %     Weight      Height      Head Circumference      Peak Flow      Pain Score 8     Pain Loc      Pain Edu?       Excl. in [place]?    No data found.  Updated Vital Signs BP (!) 148/87 (BP Location: Right Arm)   Pulse 88   Temp 98.4 F (36.9 C) (Oral)   Resp 18   SpO2 98%   Physical Exam Constitutional:      General: She is not in acute distress.    Appearance: Normal appearance. She is not ill-appearing, toxic-appearing or diaphoretic.  HENT:     Head: Normocephalic and atraumatic.     Mouth/Throat:     Mouth: Mucous membranes are moist.     Pharynx: Oropharynx is clear. Uvula midline.  Neck:     Musculoskeletal: Normal range of motion and neck supple.  Cardiovascular:     Rate and Rhythm: Normal rate and regular rhythm.     Heart sounds: Normal heart sounds. No murmur. No friction rub. No gallop.   Pulmonary:     Effort: Pulmonary effort is normal. No accessory muscle usage, prolonged expiration, respiratory distress or retractions.     Comments: Lungs clear to auscultation without adventitious lung sounds. Neurological:     General: No focal deficit present.     Mental Status: She is alert and oriented to person, place, and time.     Cranial Nerves: Cranial nerves are intact.     Sensory: Sensation is intact.     Motor: Motor function is intact.     Coordination: Coordination is intact.     Gait: Gait is intact.     Comments: Able to ambulate on own without difficulty.       UC Treatments / Results  Labs (all labs ordered are listed, but only abnormal results are displayed) Labs Reviewed - No data to display  EKG   Radiology No results found.  Procedures Procedures (including critical care time)  Medications Ordered in UC Medications - No data to display  Initial Impression / Assessment and Plan / UC Course  I have reviewed the triage vital signs and the nursing notes.  Pertinent labs & imaging results that were available during my care of the patient were reviewed by me and considered in my medical decision making (see chart for details).    Given patient without  much relief with sumatriptan,  will switch to a different triptan for trial.  Initially ordered frovatriptan for possible menstrual migraines, this was not covered by insurance.  We will try rizatriptan instead.  PCP appointment scheduled for next week, patient to follow-up for reevaluation.  Strict return precautions given.  Patient expresses understanding and agrees to plan.  Final Clinical Impressions(s) / UC Diagnoses   Final diagnoses:  Intractable migraine without aura and without status migrainosus   ED Prescriptions    Medication Sig Dispense Auth. Provider   frovatriptan (FROVA) 2.5 MG tablet Take 1 tablet (2.5 mg total) by mouth as needed for migraine. If recurs, may repeat after 2 hours. Max of 3 tabs in 24 hours. 10 tablet Tobin Chad, PA-C 08/07/19 1726

## 2019-08-07 NOTE — Discharge Instructions (Signed)
Follow-up with PCP as scheduled for further evaluation and management of migraines needed.  You can try frovatriptan for your migraine.  If you are taking frovatriptan, avoid taking sumatriptan as it can interact with each other.

## 2019-08-07 NOTE — ED Triage Notes (Signed)
Pt here for HA starting today

## 2019-08-08 ENCOUNTER — Telehealth: Payer: Self-pay

## 2019-08-08 MED ORDER — METOCLOPRAMIDE HCL 10 MG PO TABS: 10.0000 mg | ORAL_TABLET | Freq: Three times a day (TID) | ORAL | 0 refills | Status: DC | PRN

## 2019-08-08 NOTE — Telephone Encounter (Signed)
Pt states the new med given yesterday for her migraines are not helping and she is having nausea. Cathlean Sauer PA notified and new orders given to send to pharmacy, pt notified of new order.

## 2019-08-14 ENCOUNTER — Telehealth: Payer: Self-pay

## 2019-08-14 NOTE — Telephone Encounter (Signed)
Called patient to do their pre-visit COVID screening.  Call went to voicemail. Unable to do prescreening. Unsure if patient is actually switching PCP since she just established at [facility] in May 2020.

## 2019-08-15 ENCOUNTER — Ambulatory Visit: Payer: Medicaid Other | Admitting: Internal Medicine

## 2019-09-06 ENCOUNTER — Ambulatory Visit (INDEPENDENT_AMBULATORY_CARE_PROVIDER_SITE_OTHER): Payer: Medicaid Other | Admitting: Nurse Practitioner

## 2019-09-06 DIAGNOSIS — Z130 Encounter for screening for diseases of the blood and blood-forming organs and certain disorders involving the immune mechanism: Secondary | ICD-10-CM

## 2019-09-06 DIAGNOSIS — E669 Obesity, unspecified: Secondary | ICD-10-CM

## 2019-09-06 DIAGNOSIS — G43821 Menstrual migraine, not intractable, with status migrainosus: Secondary | ICD-10-CM | POA: Diagnosis not present

## 2019-09-06 MED ORDER — SUMATRIPTAN SUCCINATE 100 MG PO TABS: 100.0000 mg | ORAL_TABLET | ORAL | 3 refills | Status: DC | PRN

## 2019-09-06 NOTE — Progress Notes (Signed)
Virtual Visit via Telephone Note Due to national recommendations of social distancing due to [place] 19, telehealth visit is felt to be most appropriate for this patient at this time.  I discussed the limitations, risks, security and privacy concerns of performing an evaluation and management service by telephone and the availability of in person appointments. I also discussed with the patient that there may be a patient responsible charge related to this service. The patient expressed understanding and agreed to proceed.    I connected with Megan Colon on 09/06/19  at   9:30 AM EDT  EDT by telephone and verified that I am speaking with the correct person using two identifiers.   Consent I discussed the limitations, risks, security and privacy concerns of performing an evaluation and management service by telephone and the availability of in person appointments. I also discussed with the patient that there may be a patient responsible charge related to this service. The patient expressed understanding and agreed to proceed.   Location of Patient: Private Residence   Location of Provider: [place] and [employer] Office    Persons participating in Telemedicine visit: Geryl Rankins FNP-BC [place]    History of Present Illness: Telemedicine visit for: Establish Care  has a past medical history of Migraines.  She has a history of cyclic migraines related to her menstrual cycles. Usually start 1-2 days after the onset of her cycle. Pain scale: 8-10/10. She has had a migraine headache for 4 days now. States is also affects her quality of life and work life when she experiences them.  Associated Symptoms: N/V, photophobia, phonophobia. Headaches are usually left sided and described as pounding/throbbing.  She has tried Maxalt in the past which was ineffective. Ran out of imitrex which seems to calm her migraines. Will refill imitrex and have her evaluated at the  headache clinic. She also has a few elevated BP readings so will need office visit for BP check as well. She denies a history of HTN.  BP Readings from Last 3 Encounters:  08/07/19 (!) 148/87  06/19/19 (!) 156/99  04/25/19 (!) 150/89     Past Medical History:  Diagnosis Date  . Migraines    since childhood    Past Surgical History:  Procedure Laterality Date  . CESAREAN SECTION N/A 05/08/2017   Procedure: CESAREAN SECTION;  Surgeon: Florian Buff, MD;  Location: [place];  Service: Obstetrics;  Laterality: N/A;  . LASIK Bilateral 2006    Family History  Problem Relation Age of Onset  . Hypertension Mother   . Diabetes Father   . Cancer Neg Hx   . Stroke Neg Hx     Social History   Socioeconomic History  . Marital status: Single    Spouse name: Not on file  . Number of children: Not on file  . Years of education: Not on file  . Highest education level: Not on file  Occupational History  . Not on file  Social Needs  . Financial resource strain: Not on file  . Food insecurity    Worry: Not on file    Inability: Not on file  . Transportation needs    Medical: Not on file    Non-medical: Not on file  Tobacco Use  . Smoking status: Current Some Day Smoker    Packs/day: 0.25    Types: Cigarettes  . Smokeless tobacco: Never Used  Substance and Sexual Activity  . Alcohol use: No  . Drug  use: No  . Sexual activity: Yes    Birth control/protection: Condom  Lifestyle  . Physical activity    Days per week: Not on file    Minutes per session: Not on file  . Stress: Not on file  Relationships  . Social Herbalist on phone: Not on file    Gets together: Not on file    Attends religious service: Not on file    Active member of club or organization: Not on file    Attends meetings of clubs or organizations: Not on file    Relationship status: Not on file  Other Topics Concern  . Not on file  Social History Narrative  . Not on file      Observations/Objective: Awake, alert and oriented x 3   Review of Systems  Constitutional: Negative for fever, malaise/fatigue and weight loss.  HENT: Negative.  Negative for nosebleeds.   Eyes: Negative.  Negative for blurred vision, double vision and photophobia.  Respiratory: Negative.  Negative for cough and shortness of breath.   Cardiovascular: Negative.  Negative for chest pain, palpitations and leg swelling.  Gastrointestinal: Positive for nausea. Negative for heartburn and vomiting.  Musculoskeletal: Negative.  Negative for myalgias.  Neurological: Positive for headaches. Negative for dizziness, focal weakness and seizures.  Psychiatric/Behavioral: Negative.  Negative for suicidal ideas.    Assessment and Plan: [name] was seen today for establish care and migraine.  Diagnoses and all orders for this visit:  Menstrual migraine with status migrainosus, not intractable -     SUMAtriptan (IMITREX) 100 MG tablet; Take 1 tablet (100 mg total) by mouth every 2 (two) hours as needed for migraine. May repeat in 2 hours if headache persists or recurs. No more than 2 tablets in 24 hours -     AMB referral to headache clinic -     CMP14+EGFR; Future -     TSH; Future  Screening for deficiency anemia -     CBC; Future  Obesity (BMI 30-39.9) -     TSH; Future -     Lipid panel; Future     Follow Up Instructions No follow-ups on file.     I discussed the assessment and treatment plan with the patient. The patient was provided an opportunity to ask questions and all were answered. The patient agreed with the plan and demonstrated an understanding of the instructions.   The patient was advised to call back or seek an in-person evaluation if the symptoms worsen or if the condition fails to improve as anticipated.  I provided 18 minutes of non-face-to-face time during this encounter including median intraservice time, reviewing previous notes, labs, imaging, medications and  explaining diagnosis and management.  Gildardo Pounds, FNP-BC

## 2019-09-06 NOTE — Progress Notes (Signed)
Has had current migraine since Friday. Has c/o nausea, vomiting, sensitivity to lights

## 2019-09-11 ENCOUNTER — Other Ambulatory Visit: Payer: Medicaid Other

## 2019-09-13 ENCOUNTER — Ambulatory Visit: Payer: Medicaid Other | Admitting: Neurology

## 2019-09-13 ENCOUNTER — Encounter: Payer: Self-pay | Admitting: Neurology

## 2019-09-13 ENCOUNTER — Other Ambulatory Visit: Payer: Self-pay

## 2019-09-13 VITALS — BP 168/110 | HR 83 | Ht 61.0 in | Wt 166.0 lb

## 2019-09-13 DIAGNOSIS — R0683 Snoring: Secondary | ICD-10-CM | POA: Diagnosis not present

## 2019-09-13 DIAGNOSIS — G43019 Migraine without aura, intractable, without status migrainosus: Secondary | ICD-10-CM | POA: Diagnosis not present

## 2019-09-13 DIAGNOSIS — R351 Nocturia: Secondary | ICD-10-CM | POA: Diagnosis not present

## 2019-09-13 DIAGNOSIS — R030 Elevated blood-pressure reading, without diagnosis of hypertension: Secondary | ICD-10-CM

## 2019-09-13 DIAGNOSIS — R51 Headache: Secondary | ICD-10-CM

## 2019-09-13 DIAGNOSIS — R519 Headache, unspecified: Secondary | ICD-10-CM

## 2019-09-13 DIAGNOSIS — E669 Obesity, unspecified: Secondary | ICD-10-CM

## 2019-09-13 DIAGNOSIS — G4489 Other headache syndrome: Secondary | ICD-10-CM

## 2019-09-13 MED ORDER — TOPIRAMATE 50 MG PO TABS: ORAL_TABLET | ORAL | 5 refills | Status: DC

## 2019-09-13 MED ORDER — NURTEC 75 MG PO TBDP: 75.0000 mg | ORAL_TABLET | ORAL | 3 refills | Status: DC | PRN

## 2019-09-13 NOTE — Patient Instructions (Addendum)
I would recommend that we start you on Treatment for headache prevention with daily medication: Topamax 50 mg: Take half a pill each bedtime for one week, then one pill at bedtime daily for one week, then 1-1/2 pills at bedtime daily for one week, then 2 pills at bedtime daily thereafter. Common side effects reported are: Sedation, sleepiness, tingling, change in taste especially with carbonated drinks, and rare side effects are glaucoma, kidney stones, and problems with thinking including word finding difficulties.  Please make sure you are not pregnant and that you use precautions as Topamax is not considered fully safe in pregnancy and may also interfere with oral contraceptive medication, As an making birth control pills less effective.  For as needed use for acute migraine: We will try you on Nurtec 75 mg strength: Take 1 pill at onset of migraine headache, may repeat in 2 hours, no more than 2 pills in 24 hours. May cause sedation and nausea.   I would like for [place] to do a sleep study to rule out obstructive sleep apnea which can contribute to headaches but also to elevated blood pressure.  Please also get your blood work done which was due last week.  Please follow-up with your eye doctor as planned.  We will also proceed with a brain MRI with and without contrast.  We will call you with your test results.

## 2019-09-13 NOTE — Progress Notes (Signed)
Subjective:    Patient ID: Megan Colon is a 43 y.o. female.  HPI     Star Age, MD, PhD [facility] [address] [place], [place] [address]  Dear Army Melia,   I saw your patient, Megan Colon, upon your kind request to my neurologic clinic today for initial consultation of her recurrent headaches, concern for menstrual migraines.  The patient is unaccompanied today.  As you know, Megan Colon is a 43 year old right-handed woman with an underlying medical history of smoking and obesity, who reports migrainous headaches typically around her menstrual cycle, often on day 1 or 2 after her cycle starts.  She has had migrainous headaches for years. She has been on sumatriptan in the past and was recently restarted on it, I reviewed your virtual visit note from 09/06/2019.  She went to the emergency room on 08/07/19 with a prolonged migrainous headache.  She did not get any so-called migraine cocktail but has had some success in the past with IV medication.  She was given a prescription for frovatriptan but apparently that was not covered by her insurance and this was changed to Maxalt generic.  She has been on propranolol in the past but no longer takes it.  She reports that she had low blood pressure values.  Recently, she has noted that her blood pressure is elevated.  It is elevated today.  Of note, she does report snoring and occasional morning headaches and nocturia about twice per average night.  Her Epworth sleepiness score is 8 out of 24, fatigue severity score is 41 out of 63.  She has had worsening of her migraines, sometimes they are not associated with her menstrual cycle.  She has had migraines since she was about 43 years old.  She feels that her left side is more affected.  She has a throbbing headache.  Headaches can last for 3 or 4 days at a time.  She has at least 15 headache days per month at this time.  She has gained weight since her baby.  She has a  60-year-old son who sleeps in the crib next to her.  She lives with her fianc, her 48 year old son and her 68 year old daughter as well.  The patient is currently not working.  She has not tried Topamax for prevention.  She has not been on any birth control currently.  She indicates that she is seeking tubal ligation.  She does not drink alcohol and does not drink caffeine on a daily basis.  She is working on smoking cessation but admits that it is hard for her.  She smokes about 10 to 15 cigarettes/day.  She does not smoke around her toddler or in the house.  She may have had a brain MRI some years ago.  She had an eye examination when she was pregnant and is due for an eye exam next month.  She has prescription eyeglasses.  She has had some intermittent chest pain.  She was supposed to get blood work done last week but could not go because she had a headache. She has associated nausea but typically no vomiting, she has sensitivity to light with her migraine.  She has taken over-the-counter ibuprofen.  She has also had some tenderness in her neck and upper shoulder muscles with her headaches lately.   Her Past Medical History Is Significant For: Past Medical History:  Diagnosis Date  . Migraines    since childhood    Her Past  Surgical History Is Significant For: Past Surgical History:  Procedure Laterality Date  . CESAREAN SECTION N/A 05/08/2017   Procedure: CESAREAN SECTION;  Surgeon: Florian Buff, MD;  Location: [facility];  Service: Obstetrics;  Laterality: N/A;  . LASIK Bilateral 2006    Her Family History Is Significant For: Family History  Problem Relation Age of Onset  . Hypertension Mother   . Diabetes Father   . Cancer Neg Hx   . Stroke Neg Hx     Her Social History Is Significant For: Social History   Socioeconomic History  . Marital status: Single    Spouse name: Not on file  . Number of children: Not on file  . Years of education: Not on file  . Highest  education level: Not on file  Occupational History  . Not on file  Social Needs  . Financial resource strain: Not on file  . Food insecurity    Worry: Not on file    Inability: Not on file  . Transportation needs    Medical: Not on file    Non-medical: Not on file  Tobacco Use  . Smoking status: Current Some Day Smoker    Packs/day: 0.25    Types: Cigarettes  . Smokeless tobacco: Never Used  Substance and Sexual Activity  . Alcohol use: No  . Drug use: No  . Sexual activity: Yes    Birth control/protection: Condom  Lifestyle  . Physical activity    Days per week: Not on file    Minutes per session: Not on file  . Stress: Not on file  Relationships  . Social Herbalist on phone: Not on file    Gets together: Not on file    Attends religious service: Not on file    Active member of club or organization: Not on file    Attends meetings of clubs or organizations: Not on file    Relationship status: Not on file  Other Topics Concern  . Not on file  Social History Narrative  . Not on file    Her Allergies Are:  Allergies  Allergen Reactions  . Zofran [Ondansetron Hcl] Nausea And Vomiting  . Oxycodone Hcl Itching  :   Her Current Medications Are:  Outpatient Encounter Medications as of 09/13/2019  Medication Sig  . SUMAtriptan (IMITREX) 100 MG tablet Take 1 tablet (100 mg total) by mouth every 2 (two) hours as needed for migraine. May repeat in 2 hours if headache persists or recurs. No more than 2 tablets in 24 hours  . Rimegepant Sulfate (NURTEC) 75 MG TBDP Take 75 mg by mouth as needed (Take 1 at onset of migraine, may repeat once after 2 hours, no more than 2 per 24-hour).  . topiramate (TOPAMAX) 50 MG tablet 1/2 pill each bedtime x 1 week, then 1 pill nightly x 1 week, then 1-1/2 pills nightly x 1 week, then 2 pills nightly thereafter.  . [DISCONTINUED] frovatriptan (FROVA) 2.5 MG tablet Take 1 tablet (2.5 mg total) by mouth as needed for migraine. If  recurs, may repeat after 2 hours. Max of 3 tabs in 24 hours.   No facility-administered encounter medications on file as of 09/13/2019.   :   Review of Systems:  Out of a complete 14 point review of systems, all are reviewed and negative with the exception of these symptoms as listed below:  Review of Systems  Neurological:       Pt presents today to  discuss her migraines. Her migraines usually come around her menstrual cycle. She has 3 migraines per month that last 3-4 days each. Pt has associated N/V, light and sound sensitivity. Pt reports that imitrex does not help. Pt has never had a sleep study but does endorse snoring.  Epworth Sleepiness Scale 0= would never doze 1= slight chance of dozing 2= moderate chance of dozing 3= high chance of dozing  Sitting and reading: 2 Watching TV: 3 Sitting inactive in a public place (ex. Theater or meeting): 0 As a passenger in a car for an hour without a break: 0 Lying down to rest in the afternoon: 3 Sitting and talking to someone: 0 Sitting quietly after lunch (no alcohol): 0 In a car, while stopped in traffic: 0 Total: 8     Objective:  Neurological Exam  Physical Exam Physical Examination:   Vitals:   09/13/19 1421  BP: (!) 168/110  Pulse: 83    General Examination: The patient is a very pleasant 43 y.o. female in no acute distress. She appears well-developed and well-nourished and well groomed.   HEENT: Normocephalic, atraumatic, pupils are equal, round and reactive to light and accommodation. Funduscopic exam is normal with sharp disc margins noted. Extraocular tracking is good without limitation to gaze excursion or nystagmus noted. Normal smooth pursuit is noted. Hearing is grossly intact. Tympanic membranes are clear bilaterally. Face is symmetric with normal facial animation and normal facial sensation. Speech is clear with no dysarthria noted. There is no hypophonia. There is no lip, neck/head, jaw or voice tremor. Neck  is supple with full range of passive and active motion. There are no carotid bruits on auscultation. Oropharynx exam reveals: mild mouth dryness, good dental hygiene and mild airway crowding Due to small airway entry, neck circumference is 13 and three-quarter inches.  Tongue protrudes centrally in palate elevates symmetrically.  Chest: Clear to auscultation without wheezing, rhonchi or crackles noted.  Heart: S1+S2+0, regular and normal without murmurs, rubs or gallops noted.   Abdomen: Soft, non-tender and non-distended with normal bowel sounds appreciated on auscultation.  Extremities: There is no pitting edema in the distal lower extremities bilaterally. Pedal pulses are intact.  Skin: Warm and dry without trophic changes noted.  Musculoskeletal: exam reveals no obvious joint deformities, tenderness or joint swelling or erythema.   Neurologically:  Mental status: The patient is awake, alert and oriented in all 4 spheres. Her immediate and remote memory, attention, language skills and fund of knowledge are appropriate. There is no evidence of aphasia, agnosia, apraxia or anomia. Speech is clear with normal prosody and enunciation. Thought process is linear. Mood is normal and affect is normal.  Cranial nerves II - XII are as described above under HEENT exam. In addition: shoulder shrug is normal with equal shoulder height noted. Motor exam: Normal bulk, strength and tone is noted. There is no drift, tremor or rebound. Romberg is negative. Reflexes are 2+ throughout. Babinski: Toes are flexor bilaterally. Fine motor skills and coordination: intact with normal finger taps, normal hand movements, normal rapid alternating patting, normal foot taps and normal foot agility.  Cerebellar testing: No dysmetria or intention tremor on finger to nose testing. Heel to shin is unremarkable bilaterally. There is no truncal or gait ataxia.  Sensory exam: intact to light touch, pinprick, vibration, temperature  sense in the upper and lower extremities.  Gait, station and balance: She stands easily. No veering to one side is noted. No leaning to one side is noted. Posture  is age-appropriate and stance is narrow based. Gait shows normal stride length and normal pace. No problems turning are noted. Tandem walk is unremarkable.            Assessment and Plan:  In summary, Bonnell Placzek is a very pleasant 43 y.o.-year old female with an underlying medical history of smoking and obesity, whoPresents for evaluation of her recurrent headaches, her history is suggestive of migraines without aura, intractable without status.  She has tried propranolol for prevention but is currently no longer on it.  We talked about the importance of headache prevention.  She is advised to stay well-hydrated and well rested.  She may have underlying obstructive sleep apnea.  She has a elevated blood pressure reading today and has noticed some elevation in her blood pressure numbers.  She is advised that we should proceed with diagnostic testing in the form of sleep study to rule out underlying obstructive sleep apnea which can be a contributor to headache and blood pressure elevation.  In addition I would like to pursue a brain MRI to rule out a structural cause of her increased headaches.  She is currently having more than 15 headacheDays per month and would certainly benefit from day-to-day prevention.  I suggested Topamax with gradual increase.  She is advised that Topamax may interfere with oral contraceptives and is not considered fully safe in pregnancy.  She is currently not on birth control but is pursuing birth control and eventually would like to pursue tubal ligation.  She is encouraged to seek barrier protection for now.  She is advised to talk to her primary care physician about blood pressure management. Acute treatment with triptan lately has not been successful.  She also reports that she has had some chest discomfort recently.   This may be correlated with her triptan use but she is advised to follow-up with her primary care physician regarding her chest symptoms.  Given her elevated blood pressure, a triptan may not be her best choice for her at this juncture.  She is advised to start Nurtec 75 mg as needed, may repeat once after 2 hours.  She was given written instructions and a new prescription for Topamax and Nurtec. We will keep you posted as to her test results regarding her brain MRI and sleep study.  She is advised to follow-up routinely in 3 months, sooner if needed.  She is advised to keep her appointment for her eye examination. She is strongly advised to quit smoking. She is advised to get her blood work done that she was scheduled to have last week. I answered all her questions today and she was in agreement with the plan.  Thank you very much for allowing me to participate in the care of this nice patient. If I can be of any further assistance to you please do not hesitate to call me at [phone].  Sincerely,   Star Age, MD, PhD

## 2019-09-14 ENCOUNTER — Telehealth: Payer: Self-pay

## 2019-09-14 ENCOUNTER — Telehealth: Payer: Self-pay | Admitting: Neurology

## 2019-09-14 NOTE — Telephone Encounter (Signed)
medicaid order sent to GI. They will obtain the auth and reach out to the patient to schedule.  °

## 2019-09-14 NOTE — Telephone Encounter (Signed)
Received a PA request for nurtec from [facility]. I called [facility]. PA # [id number]. Should have a determination in 24 hours.

## 2019-09-18 MED ORDER — BUTALBITAL-APAP-CAFFEINE 50-325-40 MG PO TABS: 1.0000 | ORAL_TABLET | Freq: Four times a day (QID) | ORAL | 0 refills | Status: DC | PRN

## 2019-09-18 NOTE — Telephone Encounter (Signed)
PA for Nurtec was denied by Medicaid.

## 2019-09-18 NOTE — Telephone Encounter (Signed)
Please advise patient that the medication for acute migraine management was denied by her insurance and we will likely have to go with the preventative and re-submit in the future with more documentation.  She may do well with as needed use of fioricet. We can try Fioricet for as needed use, however, I will do a one-time prescription with no refills for now, as med is to be used cautiously as it can be addicting and sedating. It is not for daily use.  Will proceed with PSG testing too.

## 2019-09-18 NOTE — Telephone Encounter (Signed)
Fioricet RX faxed to [facility]. Received a receipt of confirmation.

## 2019-09-18 NOTE — Telephone Encounter (Signed)
I called pt and discussed this with her. She is agreeable to trying fioricet. Pt will continue with her sleep study and MRI. Pt verbalized understanding of recommendations.

## 2019-10-04 ENCOUNTER — Telehealth: Payer: Self-pay

## 2019-10-04 NOTE — Telephone Encounter (Signed)
Pt did not show up for sleep study appt for 10/01/2019. Night time tech called pt and she stated that she has a cold and migraine. Pt also states that she will be going to get a Covid test. As of today, no record in epic of Covid test attempted. Will wait for patient to call us back to reschedule.

## 2019-10-11 ENCOUNTER — Other Ambulatory Visit: Payer: Medicaid Other

## 2019-10-25 ENCOUNTER — Other Ambulatory Visit: Payer: Self-pay | Admitting: Neurology

## 2019-10-26 ENCOUNTER — Telehealth: Payer: Self-pay | Admitting: Neurology

## 2019-10-26 NOTE — Telephone Encounter (Signed)
I reached out to the pt and we discussed this message. She states she had not requested a refill of her Fioricet and was not sure where the refill came from and does not need this refill. She reports her pcp has been prescribing imitrex and she is f/u on BP as well. I discussed with the pt about rescheduling her sleep study. She states did not have a covid test completed since canceling her appt. She reports her son was tested and he was negative.  Pt was provided with the # to reschedule her sleep study. She verbalized appreciation/understanding on the call.

## 2019-10-26 NOTE — Telephone Encounter (Signed)
Please contact patient regarding the request for Fioricet refill.  She had quite a significant blood pressure elevation at the time of her first appointment with number.  I suggested a one-time trial of Fioricet after her insurance denied Nurtec.  Unfortunately, no other blood pressure values are in the system for me to compare.  I would really like for her to follow-up with her primary care physician or nurse practitioner for blood pressure management before prescribing any Fioricet or another triptan.  She was given a prescription for Imitrex recently by another provider.  We could try Maxalt next as long as her blood pressure values are better.  Treating high blood pressure will also contribute to improving headaches.  Please have the patient contact us regarding more recent blood pressure values in the past 4-6 weeks and Inquire if she had any follow-up with her primary care. Please also advise patient that she did not show for sleep study appointment on 10/01/2019. Per phone note from 10/04/2019, she had a cold and a migraine and she stated that she would get Covid testing. Covid test results are not available in epic. Please advise her to call back to the sleep lab to schedule her sleep study.

## 2019-10-31 ENCOUNTER — Other Ambulatory Visit: Payer: Self-pay

## 2019-10-31 ENCOUNTER — Ambulatory Visit
Admission: RE | Admit: 2019-10-31 | Discharge: 2019-10-31 | Disposition: A | Discharge status: Home / Self Care | Payer: Medicaid Other | Source: Ambulatory Visit | Attending: Neurology | Admitting: Neurology

## 2019-10-31 DIAGNOSIS — R519 Headache, unspecified: Secondary | ICD-10-CM | POA: Diagnosis not present

## 2019-10-31 DIAGNOSIS — G43019 Migraine without aura, intractable, without status migrainosus: Secondary | ICD-10-CM | POA: Diagnosis not present

## 2019-10-31 DIAGNOSIS — R351 Nocturia: Secondary | ICD-10-CM

## 2019-10-31 DIAGNOSIS — R0683 Snoring: Secondary | ICD-10-CM

## 2019-10-31 DIAGNOSIS — R030 Elevated blood-pressure reading, without diagnosis of hypertension: Secondary | ICD-10-CM

## 2019-10-31 DIAGNOSIS — G4489 Other headache syndrome: Secondary | ICD-10-CM

## 2019-10-31 DIAGNOSIS — E669 Obesity, unspecified: Secondary | ICD-10-CM

## 2019-10-31 MED ORDER — GADOBENATE DIMEGLUMINE 529 MG/ML IV SOLN
15.0000 mL | Freq: Once | INTRAVENOUS | Status: AC | PRN
  Administered 2019-10-31: 15 mL via INTRAVENOUS

## 2019-11-02 ENCOUNTER — Telehealth: Payer: Self-pay

## 2019-11-02 NOTE — Telephone Encounter (Signed)
Pt is scheduled for 12/01/2019 @ 8pm

## 2019-11-02 NOTE — Progress Notes (Signed)
Please call patient regarding her brain MRI which did not show any acute findings and no abnormal contrast uptake.  Michel Bickers

## 2019-11-02 NOTE — Telephone Encounter (Signed)
I called pt and discussed her MRI results and recommendations. Pt reports that she has not been called to reschedule her sleep study yet but is interested in doing it. Pt's follow up appt was made for 12/13/2019 at 11:30am with Dr. Rexene Alberts. Pt verbalized understanding.of results and new appt date and time. Pt had no questions at this time but was encouraged to call back if questions arise.

## 2019-11-02 NOTE — Telephone Encounter (Signed)
-----   Message from Star Age, MD sent at 11/02/2019 11:55 AM EST ----- Please call patient regarding her brain MRI which did not show any acute findings and no abnormal contrast uptake.  Michel Bickers

## 2019-11-08 ENCOUNTER — Other Ambulatory Visit: Payer: Medicaid Other

## 2019-11-08 ENCOUNTER — Other Ambulatory Visit: Payer: Self-pay

## 2019-11-08 DIAGNOSIS — E669 Obesity, unspecified: Secondary | ICD-10-CM | POA: Diagnosis not present

## 2019-11-08 DIAGNOSIS — Z130 Encounter for screening for diseases of the blood and blood-forming organs and certain disorders involving the immune mechanism: Secondary | ICD-10-CM | POA: Diagnosis not present

## 2019-11-08 DIAGNOSIS — G43821 Menstrual migraine, not intractable, with status migrainosus: Secondary | ICD-10-CM | POA: Diagnosis not present

## 2019-11-08 NOTE — Progress Notes (Signed)
Patient here for fasting labs. 

## 2019-11-09 ENCOUNTER — Encounter: Payer: Self-pay | Admitting: Emergency Medicine

## 2019-11-09 ENCOUNTER — Other Ambulatory Visit: Payer: Self-pay

## 2019-11-09 ENCOUNTER — Ambulatory Visit
Admission: EM | Admit: 2019-11-09 | Discharge: 2019-11-09 | Disposition: A | Discharge status: Home / Self Care | Payer: Medicaid Other | Attending: Nurse Practitioner | Admitting: Nurse Practitioner

## 2019-11-09 DIAGNOSIS — G43719 Chronic migraine without aura, intractable, without status migrainosus: Secondary | ICD-10-CM | POA: Diagnosis not present

## 2019-11-09 DIAGNOSIS — Z20828 Contact with and (suspected) exposure to other viral communicable diseases: Secondary | ICD-10-CM

## 2019-11-09 DIAGNOSIS — Z20822 Contact with and (suspected) exposure to covid-19: Secondary | ICD-10-CM

## 2019-11-09 DIAGNOSIS — B349 Viral infection, unspecified: Secondary | ICD-10-CM

## 2019-11-09 LAB — CMP14+EGFR
ALT: 14 IU/L (ref 0–32)
AST: 14 IU/L (ref 0–40)
Albumin/Globulin Ratio: 2 (ref 1.2–2.2)
Albumin: 5.3 g/dL — ABNORMAL HIGH (ref 3.8–4.8)
Alkaline Phosphatase: 67 IU/L (ref 39–117)
BUN/Creatinine Ratio: 13 (ref 9–23)
BUN: 10 mg/dL (ref 6–24)
Bilirubin Total: 0.4 mg/dL (ref 0.0–1.2)
CO2: 24 mmol/L (ref 20–29)
Calcium: 10 mg/dL (ref 8.7–10.2)
Chloride: 100 mmol/L (ref 96–106)
Creatinine, Ser: 0.76 mg/dL (ref 0.57–1.00)
GFR calc Af Amer: 111 mL/min/{1.73_m2} (ref >59)
GFR calc non Af Amer: 96 mL/min/{1.73_m2} (ref >59)
Globulin, Total: 2.6 g/dL (ref 1.5–4.5)
Glucose: 96 mg/dL (ref 65–99)
Potassium: 3.6 mmol/L (ref 3.5–5.2)
Sodium: 140 mmol/L (ref 134–144)
Total Protein: 7.9 g/dL (ref 6.0–8.5)

## 2019-11-09 LAB — POC SARS CORONAVIRUS 2 AG -  ED: SARS Coronavirus 2 Ag: NEGATIVE

## 2019-11-09 LAB — LIPID PANEL
Chol/HDL Ratio: 3.2 ratio (ref 0.0–4.4)
Cholesterol, Total: 175 mg/dL (ref 100–199)
HDL: 54 mg/dL (ref >39)
LDL Chol Calc (NIH): 108 mg/dL — ABNORMAL HIGH (ref 0–99)
Triglycerides: 66 mg/dL (ref 0–149)
VLDL Cholesterol Cal: 13 mg/dL (ref 5–40)

## 2019-11-09 LAB — CBC
Hematocrit: 49.8 % — ABNORMAL HIGH (ref 34.0–46.6)
Hemoglobin: 16.5 g/dL — ABNORMAL HIGH (ref 11.1–15.9)
MCH: 29.7 pg (ref 26.6–33.0)
MCHC: 33.1 g/dL (ref 31.5–35.7)
MCV: 90 fL (ref 79–97)
Platelets: 264 10*3/uL (ref 150–450)
RBC: 5.55 x10E6/uL — ABNORMAL HIGH (ref 3.77–5.28)
RDW: 11.9 % (ref 11.7–15.4)
WBC: 12.2 10*3/uL — ABNORMAL HIGH (ref 3.4–10.8)

## 2019-11-09 LAB — TSH: TSH: 1.5 u[IU]/mL (ref 0.450–4.500)

## 2019-11-09 MED ORDER — BUTALBITAL-APAP-CAFFEINE 50-325-40 MG PO TABS: 1.0000 | ORAL_TABLET | Freq: Four times a day (QID) | ORAL | 0 refills | Status: DC | PRN

## 2019-11-09 NOTE — Discharge Instructions (Signed)
Take medications as prescribed. Drink plenty of fluids. Please follow CDC guidelines that are attached. You may discontinue home isolation when there has been at least 10 days since symptoms onset AND 3 days fever free without antipyretics (Tylenol or Ibuprofen) AND an overall improvement in your symptoms. Go to the ED immediately if you get worse or have any other symptoms.    Feel better soon!  Aldona Bar, FNP-C

## 2019-11-09 NOTE — ED Provider Notes (Signed)
[name]    CSN: [id number] Arrival date & time: 11/09/19  1804      History   Chief Complaint Chief Complaint  Patient presents with   Headache   Nausea    HPI Megan Colon is a 43 y.o. female.   Subjective:   Megan Colon is a 43 y.o. female who presents for evaluation of influenza/COVID like symptoms. Symptoms include headache, body aches, vomiting, nausea, diarrhea, fever, chills, cough, sob, dizziness, blurred vision and loss of smell and have been present for 1 week.  Denies any loss of taste or sore throat.  No facial or limb paresthesias.  Headache is comparable to her usual migraines.  She has tried to alleviate the symptoms with rest, motrin, Asprin and Imitrex with no relief.  No known exposure to Covid.  Her daughter is sick as well.  No other sick contacts within the home.  High risk factors for influenza complications: none.  The following portions of the patient's history were reviewed and updated as appropriate: allergies, current medications, past family history, past medical history, past social history, past surgical history and problem list.         Past Medical History:  Diagnosis Date   Migraines    since childhood    Patient Active Problem List   Diagnosis Date Noted   Encounter for induction of labor 05/06/2017   Gestational hypertension 05/06/2017   Supervision of high risk pregnancy, antepartum, third trimester 04/30/2017   Clostridium difficile diarrhea 03/18/2017   Enteritis due to Clostridium difficile 03/14/2017   Advanced maternal age in multigravida, unspecified trimester 11/06/2016    Past Surgical History:  Procedure Laterality Date   CESAREAN SECTION N/A 05/08/2017   Procedure: CESAREAN SECTION;  Surgeon: Florian Buff, MD;  Location: [facility];  Service: Obstetrics;  Laterality: N/A;   LASIK Bilateral 2006    OB History    Gravida  6   Para  2   Term  2   Preterm      AB  3   Living  3     SAB      TAB  3   Ectopic      Multiple      Live Births  3            Home Medications    Prior to Admission medications   Medication Sig Start Date End Date Taking? Authorizing Provider  butalbital-acetaminophen-caffeine (FIORICET) 50-325-40 MG tablet Take 1-2 tablets by mouth every 6 (six) hours as needed for migraine. 11/09/19 11/08/20  Enrique Sack, FNP  SUMAtriptan (IMITREX) 100 MG tablet Take 1 tablet (100 mg total) by mouth every 2 (two) hours as needed for migraine. May repeat in 2 hours if headache persists or recurs. No more than 2 tablets in 24 hours 09/06/19 11/09/19  Gildardo Pounds, NP  topiramate (TOPAMAX) 50 MG tablet 1/2 pill each bedtime x 1 week, then 1 pill nightly x 1 week, then 1-1/2 pills nightly x 1 week, then 2 pills nightly thereafter. 09/13/19 11/09/19  Star Age, MD    Family History Family History  Problem Relation Age of Onset   Hypertension Mother    Diabetes Father    Cancer Neg Hx    Stroke Neg Hx     Social History Social History   Tobacco Use   Smoking status: Current Some Day Smoker    Packs/day: 0.25    Types: Cigarettes   Smokeless tobacco: Never Used  Substance Use Topics   Alcohol use: No   Drug use: No     Allergies   Zofran [ondansetron hcl] and Oxycodone hcl   Review of Systems Review of Systems  Constitutional: Positive for chills and fever.  HENT: Negative for sore throat.   Eyes: Positive for photophobia.  Respiratory: Positive for cough and shortness of breath.   Gastrointestinal: Positive for diarrhea, nausea and vomiting.  Musculoskeletal: Positive for myalgias.  Neurological: Positive for dizziness and headaches.  All other systems reviewed and are negative.    Physical Exam Triage Vital Signs ED Triage Vitals [11/09/19 1818]  Enc Vitals Group     BP (!) 144/99     Pulse Rate 74     Resp 18     Temp 98.2 F (36.8 C)     Temp Source Temporal     SpO2 97 %      Weight      Height      Head Circumference      Peak Flow      Pain Score 10     Pain Loc      Pain Edu?      Excl. in Deport?    No data found.  Updated Vital Signs BP (!) 144/99 (BP Location: Left Arm)    Pulse 74    Temp 98.2 F (36.8 C) (Temporal)    Resp 18    LMP 10/25/2019    SpO2 97%   Visual Acuity Right Eye Distance:   Left Eye Distance:   Bilateral Distance:    Right Eye Near:   Left Eye Near:    Bilateral Near:     Physical Exam Vitals signs reviewed.  Constitutional:      General: She is not in acute distress.    Appearance: She is well-developed. She is not ill-appearing or toxic-appearing.  HENT:     Head: Normocephalic.  Eyes:     General: No visual field deficit. Neck:     Musculoskeletal: Normal range of motion and neck supple. No neck rigidity.  Cardiovascular:     Rate and Rhythm: Normal rate and regular rhythm.  Pulmonary:     Effort: Pulmonary effort is normal.     Breath sounds: Normal breath sounds.  Abdominal:     Palpations: Abdomen is soft.  Musculoskeletal: Normal range of motion.  Lymphadenopathy:     Cervical: No cervical adenopathy.  Skin:    General: Skin is warm and dry.  Neurological:     Mental Status: She is alert.     Cranial Nerves: No cranial nerve deficit, dysarthria or facial asymmetry.     Sensory: No sensory deficit.  Psychiatric:        Mood and Affect: Mood normal.        Speech: Speech normal.        Behavior: Behavior normal.      UC Treatments / Results  Labs (all labs ordered are listed, but only abnormal results are displayed) Labs Reviewed  POC SARS CORONAVIRUS 2 AG -  ED - Normal    EKG   Radiology No results found.  Procedures Procedures (including critical care time)  Medications Ordered in UC Medications - No data to display  Initial Impression / Assessment and Plan / UC Course  I have reviewed the triage vital signs and the nursing notes.  Pertinent labs & imaging results that were  available during my care of the patient were reviewed by me and considered  in my medical decision making (see chart for details).    43 yo female presenting with a one-week history of headache, body aches, vomiting, nausea, diarrhea, fever, chills, cough, sob, dizziness, blurred vision and loss of smell . Paitent AAOx3. Afebrile. Nontoxic appearing. No focal deficits noted. Physical exam unremarkable. Rapid COVID negative. Declined PCR COVID testing. Supportive care with appropriate antipyretics and fluids. Neurosurgeon distributed.   Today's evaluation has revealed no signs of a dangerous process. Discussed diagnosis with patient and/or guardian. Patient and/or guardian aware of their diagnosis, possible red flag symptoms to watch out for and need for close follow up. Patient and/or guardian understands verbal and written discharge instructions. Patient and/or guardian comfortable with plan and disposition.  Patient and/or guardian has a clear mental status at this time, good insight into illness (after discussion and teaching) and has clear judgment to make decisions regarding their care  This care was provided during an unprecedented National Emergency due to the Novel Coronavirus (COVID-19) pandemic. COVID-19 infections and transmission risks place heavy strains on healthcare resources.  As this pandemic evolves, our facility, providers, and staff strive to respond fluidly, to remain operational, and to provide care relative to available resources and information. Outcomes are unpredictable and treatments are without well-defined guidelines. Further, the impact of COVID-19 on all aspects of urgent care, including the impact to patients seeking care for reasons other than COVID-19, is unavoidable during this national emergency. At this time of the global pandemic, management of patients has significantly changed, even for non-COVID positive patients given high local and regional COVID volumes at  this time requiring high healthcare system and resource utilization. The standard of care for management of both COVID suspected and non-COVID suspected patients continues to change rapidly at the local, regional, national, and global levels. This patient was worked up and treated to the best available but ever changing evidence and resources available at this current time.   Documentation was completed with the aid of voice recognition software. Transcription may contain typographical errors.   Final Clinical Impressions(s) / UC Diagnoses   Final diagnoses:  Viral illness  Encounter for laboratory testing for COVID-19 virus  Intractable chronic migraine without aura and without status migrainosus     Discharge Instructions     Take medications as prescribed. Drink plenty of fluids. Please follow CDC guidelines that are attached. You may discontinue home isolation when there has been at least 10 days since symptoms onset AND 3 days fever free without antipyretics (Tylenol or Ibuprofen) AND an overall improvement in your symptoms. Go to the ED immediately if you get worse or have any other symptoms.    Feel better soon!  Aldona Bar, FNP-C     ED Prescriptions    Medication Sig Dispense Auth. Provider   butalbital-acetaminophen-caffeine (FIORICET) 50-325-40 MG tablet Take 1-2 tablets by mouth every 6 (six) hours as needed for migraine. 30 tablet Enrique Sack, FNP     PDMP not reviewed this encounter.   Enrique Sack,  11/09/19 1908

## 2019-11-09 NOTE — ED Notes (Signed)
Patient able to ambulate independently  

## 2019-11-09 NOTE — ED Triage Notes (Signed)
Pt presents to [facility] for assessment of headache x 1 week, emesis (3 times this week), nausea, left jaw swelling, and 101 fever one day this week.

## 2019-11-14 ENCOUNTER — Other Ambulatory Visit: Payer: Self-pay | Admitting: Nurse Practitioner

## 2019-11-14 DIAGNOSIS — D72829 Elevated white blood cell count, unspecified: Secondary | ICD-10-CM

## 2019-11-20 ENCOUNTER — Telehealth: Payer: Self-pay | Admitting: Hematology and Oncology

## 2019-11-20 NOTE — Telephone Encounter (Signed)
Received a new patient referral from [facility] for leukocytosis. Megan Colon has been cld and scheduled to see Dr. Lorenso Courier on 12/14 at 10am. Pt aware to arrive 15 minutes early.

## 2019-11-20 NOTE — Progress Notes (Signed)
Patient notified of results & recommendations. Expressed understanding.

## 2019-12-01 ENCOUNTER — Ambulatory Visit (INDEPENDENT_AMBULATORY_CARE_PROVIDER_SITE_OTHER): Payer: Medicaid Other | Admitting: Neurology

## 2019-12-01 ENCOUNTER — Other Ambulatory Visit: Payer: Self-pay

## 2019-12-01 DIAGNOSIS — R030 Elevated blood-pressure reading, without diagnosis of hypertension: Secondary | ICD-10-CM

## 2019-12-01 DIAGNOSIS — G43019 Migraine without aura, intractable, without status migrainosus: Secondary | ICD-10-CM

## 2019-12-01 DIAGNOSIS — R0683 Snoring: Secondary | ICD-10-CM

## 2019-12-01 DIAGNOSIS — R351 Nocturia: Secondary | ICD-10-CM

## 2019-12-01 DIAGNOSIS — G478 Other sleep disorders: Secondary | ICD-10-CM

## 2019-12-01 DIAGNOSIS — R519 Headache, unspecified: Secondary | ICD-10-CM

## 2019-12-01 DIAGNOSIS — E669 Obesity, unspecified: Secondary | ICD-10-CM

## 2019-12-01 DIAGNOSIS — G4720 Circadian rhythm sleep disorder, unspecified type: Secondary | ICD-10-CM

## 2019-12-02 NOTE — Progress Notes (Deleted)
[place] [phone]   Fax:([phone]) [place] NOTE  Patient Care Team: Ladell Pier, MD as PCP - General (Internal Medicine)  Hematological/Oncological History #Leukocytosis  CHIEF COMPLAINTS/PURPOSE OF CONSULTATION:  Leukocytosis  HISTORY OF PRESENTING ILLNESS:  Megan Colon 43 y.o. female with medical history significant for migraines who presents for evaluation for leukocytosis and polycythemia on a recent CBC.   On review of previous records Megan Colon has a prior CBC on record from May 2018 at which time she was undergoing delivery of her child.  Her white blood cell count was elevated to 22.3 on 08 May 2017 and subsequently decreased to 14.6 on 09 May 2017.  She had substantial drop in hemoglobin of the time from 13.3-10.7, likely in the setting of delivery.  Her most recent set of labs in the one that prompted this recent consult was from November 08, 2019.  Her white blood cell count was elevated at 12.2 and her hemoglobin was 16.5.  This is a single isolated reading during an emergency department visit for flulike symptoms and headache.  On exam today***  MEDICAL HISTORY:  Past Medical History:  Diagnosis Date  . Migraines    since childhood    SURGICAL HISTORY: Past Surgical History:  Procedure Laterality Date  . CESAREAN SECTION N/A 05/08/2017   Procedure: CESAREAN SECTION;  Surgeon: Florian Buff, MD;  Location: [facility];  Service: Obstetrics;  Laterality: N/A;  . LASIK Bilateral 2006    SOCIAL HISTORY: Social History   Socioeconomic History  . Marital status: Single    Spouse name: Not on file  . Number of children: Not on file  . Years of education: Not on file  . Highest education level: Not on file  Occupational History  . Not on file  Tobacco Use  . Smoking status: Current Some Day Smoker    Packs/day: 0.25    Types: Cigarettes  . Smokeless tobacco: Never Used  Substance and Sexual Activity   . Alcohol use: No  . Drug use: No  . Sexual activity: Yes    Birth control/protection: Condom  Other Topics Concern  . Not on file  Social History Narrative  . Not on file   Social Determinants of Health   Financial Resource Strain:   . Difficulty of Paying Living Expenses: Not on file  Food Insecurity:   . Worried About Charity fundraiser in the Last Year: Not on file  . Ran Out of Food in the Last Year: Not on file  Transportation Needs:   . Lack of Transportation (Medical): Not on file  . Lack of Transportation (Non-Medical): Not on file  Physical Activity:   . Days of Exercise per Week: Not on file  . Minutes of Exercise per Session: Not on file  Stress:   . Feeling of Stress : Not on file  Social Connections:   . Frequency of Communication with Friends and Family: Not on file  . Frequency of Social Gatherings with Friends and Family: Not on file  . Attends Religious Services: Not on file  . Active Member of Clubs or Organizations: Not on file  . Attends Archivist Meetings: Not on file  . Marital Status: Not on file  Intimate Partner Violence:   . Fear of Current or Ex-Partner: Not on file  . Emotionally Abused: Not on file  . Physically Abused: Not on file  . Sexually Abused: Not on file  FAMILY HISTORY: Family History  Problem Relation Age of Onset  . Hypertension Mother   . Diabetes Father   . Cancer Neg Hx   . Stroke Neg Hx     ALLERGIES:  is allergic to zofran [ondansetron hcl] and oxycodone hcl.  MEDICATIONS:  Current Outpatient Medications  Medication Sig Dispense Refill  . butalbital-acetaminophen-caffeine (FIORICET) 50-325-40 MG tablet Take 1-2 tablets by mouth every 6 (six) hours as needed for migraine. 30 tablet 0   No current facility-administered medications for this visit.    REVIEW OF SYSTEMS:   Constitutional: ( - ) fevers, ( - )  chills , ( - ) night sweats Eyes: ( - ) blurriness of vision, ( - ) double vision, ( - )  watery eyes Ears, nose, mouth, throat, and face: ( - ) mucositis, ( - ) sore throat Respiratory: ( - ) cough, ( - ) dyspnea, ( - ) wheezes Cardiovascular: ( - ) palpitation, ( - ) chest discomfort, ( - ) lower extremity swelling Gastrointestinal:  ( - ) nausea, ( - ) heartburn, ( - ) change in bowel habits Skin: ( - ) abnormal skin rashes Lymphatics: ( - ) new lymphadenopathy, ( - ) easy bruising Neurological: ( - ) numbness, ( - ) tingling, ( - ) new weaknesses Behavioral/Psych: ( - ) mood change, ( - ) new changes  All other systems were reviewed with the patient and are negative.  PHYSICAL EXAMINATION: ECOG PERFORMANCE STATUS: {CHL ONC ECOG [id number]}  There were no vitals filed for this visit. There were no vitals filed for this visit.  GENERAL: well appearing *** in NAD  SKIN: skin color, texture, turgor are normal, no rashes or significant lesions EYES: conjunctiva are pink and non-injected, sclera clear OROPHARYNX: no exudate, no erythema; lips, buccal mucosa, and tongue normal  NECK: supple, non-tender LYMPH:  no palpable lymphadenopathy in the cervical, axillary or inguinal LUNGS: clear to auscultation and percussion with normal breathing effort HEART: regular rate & rhythm and no murmurs and no lower extremity edema ABDOMEN: soft, non-tender, non-distended, normal bowel sounds Musculoskeletal: no cyanosis of digits and no clubbing  PSYCH: alert & oriented x 3, fluent speech NEURO: no focal motor/sensory deficits  LABORATORY DATA:  I have reviewed the data as listed Lab Results  Component Value Date   WBC 12.2 (H) 11/08/2019   HGB 16.5 (H) 11/08/2019   HCT 49.8 (H) 11/08/2019   MCV 90 11/08/2019   PLT 264 11/08/2019   NEUTROABS 8.5 (H) 10/07/2016    PATHOLOGY: ***  BLOOD FILM: *** Review of the peripheral blood smear showed normal appearing white cells with neutrophils that were appropriately lobated and granulated. There was no predominance of bi-lobed or  hyper-segmented neutrophils appreciated. No Dohle bodies were noted. There was no left shifting, immature forms or blasts noted. Lymphocytes remain normal in size without any predominance of large granular lymphocytes. Red cells show no anisopoikilocytosis, macrocytes , microcytes or polychromasia. There were no schistocytes, target cells, echinocytes, acanthocytes, dacrocytes, or stomatocytes.There was no rouleaux formation, nucleated red cells, or intra-cellular inclusions noted. The platelets are normal in size, shape, and color without any clumping evident.  RADIOGRAPHIC STUDIES: I have personally reviewed the radiological images as listed and agreed with the findings in the report. No results found.  ASSESSMENT & PLAN Megan Colon 43 y.o. female with medical history significant for migraines who presents for evaluation for leukocytosis and polycythemia on a recent CBC.   Megan Colon has 2 hematological abnormalities  noted on her last CBC from November 08, 2019.  The first is a leukocytosis.  Unfortunately there is no differential on this and therefore we do not know if it is a neutrophilia or lymphocytosis.  The elevation is a 12.2 she has 2 prior counts on record with a white blood cell count of 22.3 and 14.6 respectively the clinical context in which this occurred is May 2018 at which time she underwent labor and delivery.  The other abnormality which is noted on her most recent CBC in November 08, 2019 is a erythrocytosis with a hemoglobin of 16.5.  Of note these most recent labs were drawn during emergency department visit for flulike symptoms and headache.  Our initial step will be to assess a CBC to determine if these were transient elevations or if this is a more chronic longstanding issue.  If these counts remain elevated there are numerous etiologies which could cause this to happen.  With inflammation you could see an increase in the white blood cell count.  This could be either due to  infection, medication, or smoking.  Additionally polycythemia can also be caused by smoking, obstructive sleep apnea, or primary cause such as a bone marrow abnormality.  The best way to tease this out will be to order inflammatory markers as well as erythropoietin level.  We will assess the peripheral blood smear to determine if there are any clear visible hematological abnormalities.  TSH 1.500 11/08/2019  #Leukocytosis #Polycythemia --today will collect CBC, CMP, and peripheral blood film --inflammatory markers to be checked with ESR and CRP --will check an EPO level to determine if polycythemia is secondary or primary in nature --additionally will collect an iron panel and ferritin  --if EPO level is low, then consider evaluation for MPN. --no clear indication for a bone marrow biopsy at this time.   No orders of the defined types were placed in this encounter.   All questions were answered. The patient knows to call the clinic with any problems, questions or concerns.  A total of more than {CHL ONC TIME VISIT - BZMCE:[id number]} were spent on this encounter and over half of that time was spent on counseling and coordination of care as outlined above.   Ledell Peoples, MD Department of Hematology/Oncology [place] [facility] Phone: [phone] Pager: [phone] Email: [email or web address]  12/02/2019 5:16 PM

## 2019-12-04 ENCOUNTER — Inpatient Hospital Stay: Payer: Medicaid Other | Attending: Hematology and Oncology | Admitting: Hematology and Oncology

## 2019-12-04 ENCOUNTER — Inpatient Hospital Stay: Payer: Medicaid Other

## 2019-12-13 ENCOUNTER — Telehealth: Payer: Self-pay

## 2019-12-13 ENCOUNTER — Ambulatory Visit: Payer: Self-pay | Admitting: Neurology

## 2019-12-13 NOTE — Telephone Encounter (Signed)
Patient no showed 12/13/2019 appointment with Dr. Rexene Alberts.

## 2019-12-18 ENCOUNTER — Encounter: Payer: Self-pay | Admitting: Neurology

## 2019-12-18 NOTE — Progress Notes (Signed)
Patient referred by Geryl Rankins, NP, seen by me on 09/13/19, diagnostic PSG on 12/01/19.   Please call and notify the patient that the recent sleep study did not show any significant obstructive sleep apnea. She had snoring and poor sleep consolidation. Weight loss and avoiding sleeping on the back may help her snoring. CPAP therapy is not needed.  She can FU as scheduled for her headache follow up.  Star Age, MD, PhD [facility]

## 2019-12-18 NOTE — Procedures (Signed)
PATIENT'S NAME:  Megan Colon, Diss DOB:      October 02, 1976      MR#:    [id number]     DATE OF RECORDING: 12/01/2019 REFERRING M.D.:  Maximiano Coss, NP Study Performed:   Baseline Polysomnogram HISTORY: 43 year old woman with a history of smoking and obesity, who reports migrainous headaches, snoring and occasional morning headaches. The patient endorsed the Epworth Sleepiness Scale at 8 points. The patient's weight 166 pounds with a height of 61 (inches), resulting in a BMI of 31.2 kg/m2. The patient's neck circumference measured 13.8 inches.  CURRENT MEDICATIONS: Imitrex, Nurtec, Topamax   PROCEDURE:  This is a multichannel digital polysomnogram utilizing the Somnostar 11.2 system.  Electrodes and sensors were applied and monitored per AASM Specifications.   EEG, EOG, Chin and Limb EMG, were sampled at 200 Hz.  ECG, Snore and Nasal Pressure, Thermal Airflow, Respiratory Effort, CPAP Flow and Pressure, Oximetry was sampled at 50 Hz. Digital video and audio were recorded.      BASELINE STUDY  Lights Out was at 20:45 and Lights On at 02:45.  Total recording time (TRT) was 361 minutes, with a total sleep time (TST) of 176 minutes.   The patient's sleep latency was 63.5 minutes, which is delayed. REM latency was 273.5 minutes, which is markedly delayed. The sleep efficiency was 48.8%, which markedly reduced.     SLEEP ARCHITECTURE: WASO (Wake after sleep onset) was 150 minutes with mild to moderate sleep fragmentation noted and several longer periods of wakefulness. There were 57.5 minutes in Stage N1, 67.5 minutes Stage N2, 4 minutes Stage N3 and 47 minutes in Stage REM.  The percentage of Stage N1 was 32.7%, which is markedly increased, Stage N2 was 38.4%, Stage N3 was 2.3% and Stage R (REM sleep) was 26.7%, which is mildly increased. The arousals were noted as: 36 were spontaneous, 1 were associated with PLMs, 0 were associated with respiratory events.  RESPIRATORY ANALYSIS:  There were a total of 0  respiratory events:  0 obstructive apneas, 0 central apneas and 0 mixed apneas with a total of 0 apneas and an apnea index (AI) of 0 /hour. There were 0 hypopneas with a hypopnea index of 0 /hour. The patient also had 0 respiratory event related arousals (RERAs).      The total APNEA/HYPOPNEA INDEX (AHI) was 0/hour and the total RESPIRATORY DISTURBANCE INDEX was  0 /hour.  0 events occurred in REM sleep and 0 events in NREM. The REM AHI was  0/hour, versus a non-REM AHI of 0. The patient spent 26.5 minutes of total sleep time in the supine position and 150 minutes in non-supine.. The supine AHI was 0.0 versus a non-supine AHI of 0.0.  OXYGEN SATURATION & C02:  The Wake baseline 02 saturation was 94%, with the lowest being 90%. Time spent below 89% saturation equaled 0 minutes.  PERIODIC LIMB MOVEMENTS: The patient had a total of 25 Periodic Limb Movements.  The Periodic Limb Movement (PLM) index was 8.5 and the PLM Arousal index was .3/hour.  Audio and video analysis did not show any abnormal or unusual movements, behaviors, phonations or vocalizations. The patient took 1 bathroom break. Mild to moderate snoring was noted. The EKG was in keeping with normal sinus rhythm (NSR).  Post-study, the patient indicated that sleep was worse than usual.   IMPRESSION:  1. Primary Snoring 2. Dysfunctions associated with sleep stages or arousal from sleep  RECOMMENDATIONS:  1. This study does not demonstrate any significant obstructive or central  sleep disordered breathing with the exception of snoring, which was moderate at times. CPAP therapy is not needed, but weight loss and avoiding the supine sleep position may aid in reducing her snoring.  2. This study shows sleep fragmentation and abnormal sleep stage percentages; these are nonspecific findings and per se do not signify an intrinsic sleep disorder or a cause for the patient's sleep-related symptoms. Causes include (but are not limited to) the first  night effect of the sleep study, circadian rhythm disturbances, medication effect or an underlying mood disorder or medical problem.  3. The patient should be cautioned not to drive, work at heights, or operate dangerous or heavy equipment when tired or sleepy. Review and reiteration of good sleep hygiene measures should be pursued with any patient. 4. The patient will be seen in follow-up at [facility] as scheduled.   I certify that I have reviewed the entire raw data recording prior to the issuance of this report in accordance with the Standards of Accreditation of the American Academy of Sleep Medicine (AASM)    Star Age, MD, PhD Diplomat, American Board of Neurology and Sleep Medicine (Neurology and Sleep Medicine)

## 2019-12-20 ENCOUNTER — Telehealth: Payer: Self-pay | Admitting: Neurology

## 2019-12-20 NOTE — Telephone Encounter (Signed)
Called patient to discuss sleep study results. No answer at this time. LVM for the patient to call back.   

## 2019-12-20 NOTE — Telephone Encounter (Signed)
-----   Message from Star Age, MD sent at 12/18/2019  3:15 PM EST ----- Patient referred by Geryl Rankins, NP, seen by me on 09/13/19, diagnostic PSG on 12/01/19.   Please call and notify the patient that the recent sleep study did not show any significant obstructive sleep apnea. She had snoring and poor sleep consolidation. Weight loss and avoiding sleeping on the back may help her snoring. CPAP therapy is not needed.  She can FU as scheduled for her headache follow up.  Star Age, MD, PhD [facility])

## 2019-12-27 NOTE — Telephone Encounter (Signed)
Called the patient advised her of the sleep study results. Reviewed that there was no organic sleep disorder present. Advised the patient to keep the upcoming apt in Jan to discuss headache/migraine management. Pt verbalized understanding.

## 2020-01-17 ENCOUNTER — Encounter: Payer: Self-pay | Admitting: Neurology

## 2020-01-17 ENCOUNTER — Ambulatory Visit: Payer: Medicaid Other | Admitting: Neurology

## 2020-01-17 ENCOUNTER — Other Ambulatory Visit: Payer: Self-pay

## 2020-01-17 ENCOUNTER — Telehealth: Payer: Self-pay | Admitting: Hematology and Oncology

## 2020-01-17 VITALS — BP 132/84 | HR 106 | Temp 97.2°F | Ht 60.0 in | Wt 170.3 lb

## 2020-01-17 DIAGNOSIS — G43019 Migraine without aura, intractable, without status migrainosus: Secondary | ICD-10-CM

## 2020-01-17 MED ORDER — BUTALBITAL-APAP-CAFFEINE 50-325-40 MG PO TABS: 1.0000 | ORAL_TABLET | Freq: Four times a day (QID) | ORAL | 0 refills | Status: DC | PRN

## 2020-01-17 MED ORDER — TOPIRAMATE 50 MG PO TABS: ORAL_TABLET | ORAL | 5 refills | Status: DC

## 2020-01-17 NOTE — Progress Notes (Signed)
Subjective:    Patient ID: Megan Colon is a 44 y.o. female.  HPI     Interim history:   Megan Colon is a 44 year old right-handed woman with an underlying medical history of smoking and obesity, who presents for follow-up consultation of her migraine headaches.  The patient is unaccompanied today.  She missed an appointment on 12/13/2019. I first met her on 09/13/2019 at the request of her primary care physician, at which time I suggested migraine prevention with Topamax, also acute treatment with as needed Nurtec.  She had been on Maxalt generic, her insurance had not covered frovatriptan which was originally suggested by the ER practitioner.  She had an elevated blood pressure at the time of her visit.  She also reported morning headaches and nocturia and I suggested she proceed with a sleep study.  I also advised her to have a brain MRI. She had a brain MRI with and without contrast on 10/31/2019 and I reviewed the results: IMPRESSION:    Unremarkable MRI brain (with and without). Few punctate periventricular and subcortical foci of non-specific gliosis. No acute findings.   She had a sleep study on 12/01/2019: Overall AHI was 0/h, O2 nadir was 90%.   We called her with her test results.  Her insurance denied Nurtec, I provided a short prescription for Fioricet.  Today, 12/13/2019: She reports having had intermittent chest pain.  She went to the ER with this.  She has not been on any new medication, was supposed to start a blood pressure medication.  She also never started the Topamax as she got confused and thought it was for blood pressure.  She has been taking Fioricet as needed.  She reports that she still has the prescription from September but it looks like she did get a renewal of the Fioricet prescription through her primary care November.  She reports that she does not take it daily.  She would be willing to start the Topamax.  She reports stress, particularly financial constraints.   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  She used to work at a [employer] as a Research scientist (physical sciences).  She has tried Imitrex as needed as prescribed by her primary care nurse practitioner but feels that it causes her more chest pain.   The patient's allergies, current medications, family history, past medical history, past social history, past surgical history and problem list were reviewed and updated as appropriate.   Previously:   09/13/2019: (She) reports migrainous headaches typically around her menstrual cycle, often on day 1 or 2 after her cycle starts.  She has had migrainous headaches for years. She has been on sumatriptan in the past and was recently restarted on it, I reviewed your virtual visit note from 09/06/2019.  She went to the emergency room on 08/07/19 with a prolonged migrainous headache.  She did not get any so-called migraine cocktail but has had some success in the past with IV medication.  She was given a prescription for frovatriptan but apparently that was not covered by her insurance and this was changed to Maxalt generic.  She has been on propranolol in the past but no longer takes it.  She reports that she had low blood pressure values.  Recently, she has noted that her blood pressure is elevated.  It is elevated today.  Of note, she does report snoring and occasional morning headaches and nocturia about twice per average night.  Her Epworth sleepiness score  is 8 out of 24, fatigue severity score is 41 out of 63.  She has had worsening of her migraines, sometimes they are not associated with her menstrual cycle.  She has had migraines since she was about 44 years old.  She feels that her left side is more affected.  She has a throbbing headache.  Headaches can last for 3 or 4 days at a time.  She has at least 15 headache days per month at this time.  She has gained weight since her baby.  She has a 30-year-old son who sleeps in the crib next to her.   She lives with her fianc, her 78 year old son and her 60 year old daughter as well.  The patient is currently not working.  She has not tried Topamax for prevention.  She has not been on any birth control currently.  She indicates that she is seeking tubal ligation.  She does not drink alcohol and does not drink caffeine on a daily basis.  She is working on smoking cessation but admits that it is hard for her.  She smokes about 10 to 15 cigarettes/day.  She does not smoke around her toddler or in the house.  She may have had a brain MRI some years ago.  She had an eye examination when she was pregnant and is due for an eye exam next month.  She has prescription eyeglasses.  She has had some intermittent chest pain.  She was supposed to get blood work done last week but could not go because she had a headache. She has associated nausea but typically no vomiting, she has sensitivity to light with her migraine.  She has taken over-the-counter ibuprofen.  She has also had some tenderness in her neck and upper shoulder muscles with her headaches lately.   Her Past Medical History Is Significant For: Past Medical History:  Diagnosis Date  . Migraines    since childhood    Her Past Surgical History Is Significant For: Past Surgical History:  Procedure Laterality Date  . CESAREAN SECTION N/A 05/08/2017   Procedure: CESAREAN SECTION;  Surgeon: Florian Buff, MD;  Location: [facility];  Service: Obstetrics;  Laterality: N/A;  . LASIK Bilateral 2006    Her Family History Is Significant For: Family History  Problem Relation Age of Onset  . Hypertension Mother   . Diabetes Father   . Cancer Neg Hx   . Stroke Neg Hx     Her Social History Is Significant For: Social History   Socioeconomic History  . Marital status: Single    Spouse name: Not on file  . Number of children: Not on file  . Years of education: Not on file  . Highest education level: Not on file  Occupational History  .  Not on file  Tobacco Use  . Smoking status: Current Some Day Smoker    Packs/day: 0.25    Types: Cigarettes  . Smokeless tobacco: Never Used  Substance and Sexual Activity  . Alcohol use: No  . Drug use: No  . Sexual activity: Yes    Birth control/protection: Condom  Other Topics Concern  . Not on file  Social History Narrative  . Not on file   Social Determinants of Health   Financial Resource Strain:   . Difficulty of Paying Living Expenses: Not on file  Food Insecurity:   . Worried About Charity fundraiser in the Last Year: Not on file  . Ran Out of Food in  the Last Year: Not on file  Transportation Needs:   . Lack of Transportation (Medical): Not on file  . Lack of Transportation (Non-Medical): Not on file  Physical Activity:   . Days of Exercise per Week: Not on file  . Minutes of Exercise per Session: Not on file  Stress:   . Feeling of Stress : Not on file  Social Connections:   . Frequency of Communication with Friends and Family: Not on file  . Frequency of Social Gatherings with Friends and Family: Not on file  . Attends Religious Services: Not on file  . Active Member of Clubs or Organizations: Not on file  . Attends Archivist Meetings: Not on file  . Marital Status: Not on file    Her Allergies Are:  Allergies  Allergen Reactions  . Zofran [Ondansetron Hcl] Nausea And Vomiting  . Oxycodone Hcl Itching  :   Her Current Medications Are:  Outpatient Encounter Medications as of 01/17/2020  Medication Sig  . butalbital-acetaminophen-caffeine (FIORICET) 50-325-40 MG tablet Take 1-2 tablets by mouth every 6 (six) hours as needed for migraine.  . topiramate (TOPAMAX) 50 MG tablet 1/2 pill each bedtime x 1 week, then 1 pill nightly x 1 week, then 1-1/2 pills nightly x 1 week, then 2 pills nightly thereafter.  . [DISCONTINUED] butalbital-acetaminophen-caffeine (FIORICET) 50-325-40 MG tablet Take 1-2 tablets by mouth every 6 (six) hours as needed for  migraine.  . [DISCONTINUED] SUMAtriptan (IMITREX) 100 MG tablet Take 1 tablet (100 mg total) by mouth every 2 (two) hours as needed for migraine. May repeat in 2 hours if headache persists or recurs. No more than 2 tablets in 24 hours  . [DISCONTINUED] topiramate (TOPAMAX) 50 MG tablet 1/2 pill each bedtime x 1 week, then 1 pill nightly x 1 week, then 1-1/2 pills nightly x 1 week, then 2 pills nightly thereafter.   No facility-administered encounter medications on file as of 01/17/2020.  :  Review of Systems:  Out of a complete 14 point review of systems, all are reviewed and negative with the exception of these symptoms as listed below: Review of Systems  Neurological:       Reports still trouble sleeping and increased headaches.     Objective:  Neurological Exam  Physical Exam Physical Examination:   Vitals:   01/17/20 1306  BP: 132/84  Pulse: (!) 106  Temp: (!) 97.2 F (36.2 C)    General Examination: The patient is a very pleasant 44 y.o. female in no acute distress. She appears well-developed and well-nourished and well groomed.   HEENT: Normocephalic, atraumatic, pupils are equal, round and reactive to light and accommodation. Extraocular tracking is good without limitation to gaze excursion or nystagmus noted. Normal smooth pursuit is noted. Hearing is grossly intact. Face is symmetric with normal facial animation and normal facial sensation. Speech is clear with no dysarthria noted. There is no hypophonia. There is no lip, neck/head, jaw or voice tremor. Neck is supple with full range of passive and active motion. There are no carotid bruits on auscultation. Oropharynx exam reveals: mild mouth dryness, good dental hygiene and mild airway crowding. Tongue protrudes centrally in palate elevates symmetrically.  Chest: Clear to auscultation without wheezing, rhonchi or crackles noted.  Heart: S1+S2+0, regular and normal without murmurs, rubs or gallops noted.   Abdomen:  Soft, non-tender and non-distended with normal bowel sounds appreciated on auscultation.  Extremities: There is no pitting edema in the distal lower extremities bilaterally.   Skin:  Warm and dry without trophic changes noted.  Musculoskeletal: exam reveals no obvious joint deformities, tenderness or joint swelling or erythema.   Neurologically:  Mental status: The patient is awake, alert and oriented in all 4 spheres. Her immediate and remote memory, attention, language skills and fund of knowledge are appropriate. There is no evidence of aphasia, agnosia, apraxia or anomia. Speech is clear with normal prosody and enunciation. Thought process is linear. Mood is normal and affect is normal.  Cranial nerves II - XII are as described above under HEENT exam. In addition: shoulder shrug is normal with equal shoulder height noted. Motor exam: Normal bulk, strength and tone is noted. There is no drift, tremor or rebound. Romberg is negative. Reflexes are 2+ throughout. Fine motor skills and coordination: intact with normal finger taps, normal hand movements, normal rapid alternating patting, normal foot taps and normal foot agility.  Cerebellar testing: No dysmetria or intention tremor on finger to nose testing. Heel to shin is unremarkable bilaterally. There is no truncal or gait ataxia.  Sensory exam: intact to light touch, pinprick, vibration, temperature sense in the upper and lower extremities.  Gait, station and balance: She stands easily. No veering to one side is noted. No leaning to one side is noted. Posture is age-appropriate and stance is narrow based. Gait shows normal stride length and normal pace. No problems turning are noted. Tandem walk is unremarkable.            Assessment and Plan:  In summary, Megan Colon is a very pleasant 44 year old female with an underlying medical history of smoking and obesity, who presents for Follow-up consultation of her migraines.  She has in the past  tried propranolol. When I met her in September 2020, she was advised to start with preventative treatment in the form of Topamax low-dose with gradual titration.  She never actually started this.  She has been on Fioricet as needed.  I suggested she start the titration with Topamax daily and I will renew her Fioricet one time.  She is discouraged from using Fioricet on a regular basis as it can be sedating and addicting.  She had interim testing including a brain MRI which was benign and a sleep study which was also benign.  She has a nonfocal exam.  She is encouraged to use Fioricet as needed sparingly and start Topamax generic for prevention.  She is advised to follow-up in 3 months to see the nurse practitioner.  I answered all her questions today and she was in agreement.  I spent 30 minutes in total face-to-face time and in reviewing records during pre-charting, more than 50% of which was spent in counseling and coordination of care, reviewing test results, reviewing medication and discussing or reviewing the diagnosis of migraine headaches, the prognosis and treatment options. Pertinent laboratory and imaging test results that were available during this visit with the patient were reviewed by me and considered in my medical decision making (see chart for details).

## 2020-01-17 NOTE — Telephone Encounter (Signed)
Returned patient's phone call regarding rescheduling cancelled December appointment, per patient's request appointment has been rescheduled.

## 2020-01-17 NOTE — Patient Instructions (Addendum)
As discussed, I recommend that you start migraine prevention with Topamax 50 mg: Take half a pill each bedtime for one week, then one pill at bedtime daily for one week, then 1-1/2 pills at bedtime daily for one week, then 2 pills at bedtime daily thereafter. Common side effects reported are: Sedation, sleepiness, tingling, change in taste especially with carbonated drinks, and rare side effects are glaucoma, kidney stones, and problems with thinking including word finding difficulties. I will also review your Fioricet prescription for a 1 month renewal, it looks like you had a prescription renewed by your primary care provider in November. Please be cautious with the Fioricet, it is not for daily use and it can be addicting and sedating.

## 2020-01-23 NOTE — Progress Notes (Signed)
[place] [phone]   [phone] [place] NOTE  Patient Care Team: Ladell Pier, MD as PCP - General (Internal Medicine)  Hematological/Oncological History #Leukocytosis 1) 09/17/2016: WBC 13.5, Hgb 14.0, Plt 258, Hct 42.2. First elevated WBC on our records 2) 05/08/2017: WBC 24.1, Hgb 13.4, Plt 155. Day of C section, delivery of healthy baby boy.  3) 11/08/2019: WBC 12.2, Hgb 16.5, Hct 49.8, Plt 264.  4) 01/24/2020: establish care with Dr. Lorenso Courier   CHIEF COMPLAINTS/PURPOSE OF CONSULTATION:  Leukocytosis  HISTORY OF PRESENTING ILLNESS:  Suttyn Cryder 44 y.o. female with medical history significant for migraines who presents for evaluation of leukocytosis.   On review of the previous records Ms. Egner has a leukocytosis dating back to at least 05/08/2017.  At that time she was noted to have a white blood cell count of 24.1 hemoglobin of 13.4 platelets of 155.  This was the day of her C-section when she delivered her healthy baby boy.  Most recently on 11/08/2019 the patient was found to have white blood cell count of 12.2 hemoglobin 16.5 hematocrit of 49.8 and platelets of 264.  Due to concern for elevation in the white blood cell count the patient was referred to hematology for further evaluation and management.  On exam today Ms. Dikes notes that she does have issues with chronic migraine headaches and associated nausea.  She notes that she has been having some issues with fatigue and trouble sleeping as well.  She denies having any fevers, chills, sweats, nausea, vomiting, or diarrhea.  She denies any recent unintentional weight loss.  She notes that she does eat a diet well-balanced in meat and vegetables.  She reports that she is an active every day smoker and smokes approximately 1/2 pack/day since the age of 44 years old.  She has expressed interest in quitting today.  Additionally she notes that she has had no recent infections or  infectious symptoms.  A full 10 point ROS is listed below.  MEDICAL HISTORY:  Past Medical History:  Diagnosis Date  . Arthritis   . GERD (gastroesophageal reflux disease)   . Lung cancer [facility])    Father  . Migraines    since childhood    SURGICAL HISTORY: Past Surgical History:  Procedure Laterality Date  . CESAREAN SECTION N/A 05/08/2017   Procedure: CESAREAN SECTION;  Surgeon: Florian Buff, MD;  Location: [place];  Service: Obstetrics;  Laterality: N/A;  . LASIK Bilateral 2006    SOCIAL HISTORY: Social History   Socioeconomic History  . Marital status: Single    Spouse name: Not on file  . Number of children: Not on file  . Years of education: Not on file  . Highest education level: Not on file  Occupational History  . Not on file  Tobacco Use  . Smoking status: Current Some Day Smoker    Packs/day: 0.25    Types: Cigarettes  . Smokeless tobacco: Never Used  Substance and Sexual Activity  . Alcohol use: No  . Drug use: No  . Sexual activity: Yes    Birth control/protection: Condom  Other Topics Concern  . Not on file  Social History Narrative  . Not on file   Social Determinants of Health   Financial Resource Strain:   . Difficulty of Paying Living Expenses: Not on file  Food Insecurity:   . Worried About Charity fundraiser in the Last Year: Not on file  . Ran Out  of Food in the Last Year: Not on file  Transportation Needs:   . Lack of Transportation (Medical): Not on file  . Lack of Transportation (Non-Medical): Not on file  Physical Activity:   . Days of Exercise per Week: Not on file  . Minutes of Exercise per Session: Not on file  Stress:   . Feeling of Stress : Not on file  Social Connections:   . Frequency of Communication with Friends and Family: Not on file  . Frequency of Social Gatherings with Friends and Family: Not on file  . Attends Religious Services: Not on file  . Active Member of Clubs or Organizations: Not on file  .  Attends Archivist Meetings: Not on file  . Marital Status: Not on file  Intimate Partner Violence:   . Fear of Current or Ex-Partner: Not on file  . Emotionally Abused: Not on file  . Physically Abused: Not on file  . Sexually Abused: Not on file    FAMILY HISTORY: Family History  Problem Relation Age of Onset  . Hypertension Mother   . Diabetes Father   . Cancer Neg Hx   . Stroke Neg Hx     ALLERGIES:  is allergic to zofran [ondansetron hcl] and oxycodone hcl.  MEDICATIONS:  Current Outpatient Medications  Medication Sig Dispense Refill  . butalbital-acetaminophen-caffeine (FIORICET) 50-325-40 MG tablet Take 1-2 tablets by mouth every 6 (six) hours as needed for migraine. 30 tablet 0  . topiramate (TOPAMAX) 50 MG tablet 1/2 pill each bedtime x 1 week, then 1 pill nightly x 1 week, then 1-1/2 pills nightly x 1 week, then 2 pills nightly thereafter. 60 tablet 5   No current facility-administered medications for this visit.    REVIEW OF SYSTEMS:   Constitutional: ( - ) fevers, ( - )  chills , ( - ) night sweats Eyes: ( - ) blurriness of vision, ( - ) double vision, ( - ) watery eyes Ears, nose, mouth, throat, and face: ( - ) mucositis, ( - ) sore throat Respiratory: ( - ) cough, ( - ) dyspnea, ( - ) wheezes Cardiovascular: ( - ) palpitation, ( - ) chest discomfort, ( - ) lower extremity swelling Gastrointestinal:  ( - ) nausea, ( - ) heartburn, ( - ) change in bowel habits Skin: ( - ) abnormal skin rashes Lymphatics: ( - ) new lymphadenopathy, ( - ) easy bruising Neurological: ( - ) numbness, ( - ) tingling, ( - ) new weaknesses (+) headaches Behavioral/Psych: ( - ) mood change, ( - ) new changes  All other systems were reviewed with the patient and are negative.  PHYSICAL EXAMINATION: ECOG PERFORMANCE STATUS: 0 - Asymptomatic  Vitals:   01/24/20 1333  BP: (!) 141/92  Pulse: 94  Resp: 17  Temp: 97.8 F (36.6 C)  SpO2: 100%   Filed Weights   01/24/20  1333  Weight: 170 lb 14.4 oz (77.5 kg)    GENERAL: well appearing middle aged Hispanic female in NAD  SKIN: skin color, texture, turgor are normal, no rashes or significant lesions EYES: conjunctiva are pink and non-injected, sclera clear LUNGS: clear to auscultation and percussion with normal breathing effort HEART: regular rate & rhythm and no murmurs and no lower extremity edema ABDOMEN: soft, non-tender, non-distended, normal bowel sounds Musculoskeletal: no cyanosis of digits and no clubbing  PSYCH: alert & oriented x 3, fluent speech NEURO: no focal motor/sensory deficits  LABORATORY DATA:  I have reviewed the  data as listed CBC Latest Ref Rng & Units 01/24/2020 11/08/2019 05/09/2017  WBC 4.0 - 10.5 K/uL 10.0 12.2(H) 14.6(H)  Hemoglobin 12.0 - 15.0 g/dL 14.2 16.5(H) 10.7(L)  Hematocrit 36.0 - 46.0 % 42.4 49.8(H) 30.9(L)  Platelets 150 - 400 K/uL 269 264 146(L)    CMP Latest Ref Rng & Units 01/24/2020 11/08/2019 05/06/2017  Glucose 70 - 99 mg/dL 79 96 86  BUN 6 - 20 mg/dL '16 10 10  ' Creatinine 0.44 - 1.00 mg/dL 0.69 0.76 0.45  Sodium 135 - 145 mmol/L 141 140 134(L)  Potassium 3.5 - 5.1 mmol/L 3.6 3.6 4.0  Chloride 98 - 111 mmol/L 107 100 105  CO2 22 - 32 mmol/L 24 24 20(L)  Calcium 8.9 - 10.3 mg/dL 9.2 10.0 9.2  Total Protein 6.5 - 8.1 g/dL 7.5 7.9 6.6  Total Bilirubin 0.3 - 1.2 mg/dL <0.2(L) 0.4 0.5  Alkaline Phos 38 - 126 U/L 64 67 127(H)  AST 15 - 41 U/L 9(L) 14 20  ALT 0 - 44 U/L '18 14 19     ' PATHOLOGY: None relevant to review.   BLOOD FILM:  Review of the peripheral blood smear showed normal appearing white cells with neutrophils that were appropriately lobated and granulated. There was no predominance of bi-lobed or hyper-segmented neutrophils appreciated. No Dohle bodies were noted. There was no left shifting, immature forms or blasts noted. Lymphocytes remain normal in size without any predominance of large granular lymphocytes, however there is a noted increase in  atypical lymphocytes. Red cells show no anisopoikilocytosis, macrocytes , microcytes or polychromasia. There were no schistocytes, target cells, echinocytes, acanthocytes, dacrocytes, or stomatocytes.There was no rouleaux formation, nucleated red cells, or intra-cellular inclusions noted. The platelets are normal in size, shape, and color without any clumping evident.  RADIOGRAPHIC STUDIES: None relevant to review.  No results found.  ASSESSMENT & PLAN Nadina Fomby 44 y.o. female with medical history significant for migraines who presents for evaluation of leukocytosis.  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  The most likely etiology for this would be her smoking.  In order to rule out other possible etiologies today we will order a CRP and ESR and also gather an erythropoietin level given her mildly increased hemoglobin on prior outside labs.  We will review peripheral blood film to assure that there are no abnormalities in the neutrophil population.  Additionally I do not think there is any need for an MPN work-up or bone marrow biopsy at this time as there is a clear inciting factor with a history of smoking.  The patient has expressed a desire to stop smoking and therefore today we will provide her with resources to begin to quit.  In order to assure that this leukocytosis does not worsen or change in nature we will have the patient return in 6 months time.  In the interval if she is found to have any other issues such as increase in the white blood cell count or decrease in hemoglobin or platelets please have her referred back promptly.  #Leukocytosis, Neutrophilic Predominance --today will order CBC, CMP, and peripheral blood film --additionally will assess CRP/ESR for chronic inflammation --will order an EPO level given her elevated Hgb to r/o primary causes --encourage smoking cessation as this is the  likely cause of her leukocytosis --no current indication for MPN workup or bone marrow biopsy  --RTC in 6 months to reassess the WBC  Orders  Placed This Encounter  Procedures  . CBC with Differential (Cancer Center Only)    Standing Status:   Future    Number of Occurrences:   1    Standing Expiration Date:   01/23/2021  . Save Smear (SSMR)    Standing Status:   Future    Number of Occurrences:   1    Standing Expiration Date:   01/23/2021  . CMP ([place] only)    Standing Status:   Future    Number of Occurrences:   1    Standing Expiration Date:   01/23/2021  . Lactate dehydrogenase (LDH)    Standing Status:   Future    Number of Occurrences:   1    Standing Expiration Date:   01/23/2021  . Iron and TIBC    Standing Status:   Future    Number of Occurrences:   1    Standing Expiration Date:   01/23/2021  . Ferritin    Standing Status:   Future    Number of Occurrences:   1    Standing Expiration Date:   01/23/2021  . Erythropoietin    Standing Status:   Future    Number of Occurrences:   1    Standing Expiration Date:   01/23/2021  . Sedimentation rate    Standing Status:   Future    Number of Occurrences:   1    Standing Expiration Date:   01/23/2021  . C-reactive protein    Standing Status:   Future    Number of Occurrences:   1    Standing Expiration Date:   01/23/2021  . Hemoglobin A1c    Standing Status:   Future    Number of Occurrences:   1    Standing Expiration Date:   01/23/2021    All questions were answered. The patient knows to call the clinic with any problems, questions or concerns.  A total of more than 60 minutes were spent on this encounter and over half of that time was spent on counseling and coordination of care as outlined above.   Ledell Peoples, MD Department of Hematology/Oncology [place] [facility] Phone: [phone] Pager: [phone] Email: [email or web address]  01/25/2020 8:56 AM

## 2020-01-24 ENCOUNTER — Encounter: Payer: Self-pay | Admitting: Hematology and Oncology

## 2020-01-24 ENCOUNTER — Inpatient Hospital Stay: Payer: Medicaid Other | Attending: Hematology and Oncology | Admitting: Hematology and Oncology

## 2020-01-24 ENCOUNTER — Inpatient Hospital Stay: Payer: Medicaid Other

## 2020-01-24 ENCOUNTER — Other Ambulatory Visit: Payer: Self-pay

## 2020-01-24 VITALS — BP 141/92 | HR 94 | Temp 97.8°F | Resp 17 | Ht 60.0 in | Wt 170.9 lb

## 2020-01-24 DIAGNOSIS — F17210 Nicotine dependence, cigarettes, uncomplicated: Secondary | ICD-10-CM | POA: Diagnosis not present

## 2020-01-24 DIAGNOSIS — D72823 Leukemoid reaction: Secondary | ICD-10-CM

## 2020-01-24 DIAGNOSIS — R5383 Other fatigue: Secondary | ICD-10-CM | POA: Diagnosis not present

## 2020-01-24 DIAGNOSIS — R110 Nausea: Secondary | ICD-10-CM | POA: Diagnosis not present

## 2020-01-24 DIAGNOSIS — Z79899 Other long term (current) drug therapy: Secondary | ICD-10-CM | POA: Insufficient documentation

## 2020-01-24 DIAGNOSIS — G43909 Migraine, unspecified, not intractable, without status migrainosus: Secondary | ICD-10-CM | POA: Diagnosis not present

## 2020-01-24 DIAGNOSIS — D72829 Elevated white blood cell count, unspecified: Secondary | ICD-10-CM | POA: Diagnosis present

## 2020-01-24 LAB — SAVE SMEAR(SSMR), FOR PROVIDER SLIDE REVIEW

## 2020-01-24 LAB — CBC WITH DIFFERENTIAL (CANCER CENTER ONLY)
Abs Immature Granulocytes: 0.03 10*3/uL (ref 0.00–0.07)
Basophils Absolute: 0.1 10*3/uL (ref 0.0–0.1)
Basophils Relative: 1 %
Eosinophils Absolute: 0.3 10*3/uL (ref 0.0–0.5)
Eosinophils Relative: 3 %
HCT: 42.4 % (ref 36.0–46.0)
Hemoglobin: 14.2 g/dL (ref 12.0–15.0)
Immature Granulocytes: 0 %
Lymphocytes Relative: 30 %
Lymphs Abs: 3 10*3/uL (ref 0.7–4.0)
MCH: 30.3 pg (ref 26.0–34.0)
MCHC: 33.5 g/dL (ref 30.0–36.0)
MCV: 90.4 fL (ref 80.0–100.0)
Monocytes Absolute: 0.8 10*3/uL (ref 0.1–1.0)
Monocytes Relative: 8 %
Neutro Abs: 5.8 10*3/uL (ref 1.7–7.7)
Neutrophils Relative %: 58 %
Platelet Count: 269 10*3/uL (ref 150–400)
RBC: 4.69 MIL/uL (ref 3.87–5.11)
RDW: 12.3 % (ref 11.5–15.5)
WBC Count: 10 10*3/uL (ref 4.0–10.5)
nRBC: 0 % (ref 0.0–0.2)

## 2020-01-24 LAB — CMP (CANCER CENTER ONLY)
ALT: 18 U/L (ref 0–44)
AST: 9 U/L — ABNORMAL LOW (ref 15–41)
Albumin: 4.4 g/dL (ref 3.5–5.0)
Alkaline Phosphatase: 64 U/L (ref 38–126)
Anion gap: 10 (ref 5–15)
BUN: 16 mg/dL (ref 6–20)
CO2: 24 mmol/L (ref 22–32)
Calcium: 9.2 mg/dL (ref 8.9–10.3)
Chloride: 107 mmol/L (ref 98–111)
Creatinine: 0.69 mg/dL (ref 0.44–1.00)
GFR, Est AFR Am: >60 mL/min (ref >60)
GFR, Estimated: >60 mL/min (ref >60)
Glucose, Bld: 79 mg/dL (ref 70–99)
Potassium: 3.6 mmol/L (ref 3.5–5.1)
Sodium: 141 mmol/L (ref 135–145)
Total Bilirubin: <0.2 mg/dL — ABNORMAL LOW (ref 0.3–1.2)
Total Protein: 7.5 g/dL (ref 6.5–8.1)

## 2020-01-24 LAB — SEDIMENTATION RATE: Sed Rate: 5 mm/hr (ref 0–22)

## 2020-01-24 LAB — C-REACTIVE PROTEIN: CRP: 1.5 mg/dL — ABNORMAL HIGH (ref <1.0)

## 2020-01-24 LAB — HEMOGLOBIN A1C
Hgb A1c MFr Bld: 5.2 % (ref 4.8–5.6)
Mean Plasma Glucose: 102.54 mg/dL

## 2020-01-24 LAB — LACTATE DEHYDROGENASE: LDH: 130 U/L (ref 98–192)

## 2020-01-24 NOTE — Patient Instructions (Signed)
Steps to Quit Smoking Smoking tobacco is the leading cause of preventable death. It can affect almost every organ in the body. Smoking puts you and people around you at risk for many serious, long-lasting (chronic) diseases. Quitting smoking can be hard, but it is one of the best things that you can do for your health. It is never too late to quit. How do I get ready to quit? When you decide to quit smoking, make a plan to help you succeed. Before you quit:  Pick a date to quit. Set a date within the next 2 weeks to give you time to prepare.  Write down the reasons why you are quitting. Keep this list in places where you will see it often.  Tell your family, friends, and co-workers that you are quitting. Their support is important.  Talk with your doctor about the choices that may help you quit.  Find out if your health insurance will pay for these treatments.  Know the people, places, things, and activities that make you want to smoke (triggers). Avoid them. What first steps can I take to quit smoking?  Throw away all cigarettes at home, at work, and in your car.  Throw away the things that you use when you smoke, such as ashtrays and lighters.  Clean your car. Make sure to empty the ashtray.  Clean your home, including curtains and carpets. What can I do to help me quit smoking? Talk with your doctor about taking medicines and seeing a counselor at the same time. You are more likely to succeed when you do both.  If you are pregnant or breastfeeding, talk with your doctor about counseling or other ways to quit smoking. Do not take medicine to help you quit smoking unless your doctor tells you to do so. To quit smoking: Quit right away  Quit smoking totally, instead of slowly cutting back on how much you smoke over a period of time.  Go to counseling. You are more likely to quit if you go to counseling sessions regularly. Take medicine You may take medicines to help you quit. Some  medicines need a prescription, and some you can buy over-the-counter. Some medicines may contain a drug called nicotine to replace the nicotine in cigarettes. Medicines may:  Help you to stop having the desire to smoke (cravings).  Help to stop the problems that come when you stop smoking (withdrawal symptoms). Your doctor may ask you to use:  Nicotine patches, gum, or lozenges.  Nicotine inhalers or sprays.  Non-nicotine medicine that is taken by mouth. Find resources Find resources and other ways to help you quit smoking and remain smoke-free after you quit. These resources are most helpful when you use them often. They include:  Online chats with a counselor.  Phone quitlines.  Printed self-help materials.  Support groups or group counseling.  Text messaging programs.  Mobile phone apps. Use apps on your mobile phone or tablet that can help you stick to your quit plan. There are many free apps for mobile phones and tablets as well as websites. Examples include Quit Guide from the CDC and smokefree.gov  What things can I do to make it easier to quit?   Talk to your family and friends. Ask them to support and encourage you.  Call a phone quitline (1-800-QUIT-NOW), reach out to support groups, or work with a counselor.  Ask people who smoke to not smoke around you.  Avoid places that make you want to smoke,   such as: ? Bars. ? Parties. ? Smoke-break areas at work.  Spend time with people who do not smoke.  Lower the stress in your life. Stress can make you want to smoke. Try these things to help your stress: ? Getting regular exercise. ? Doing deep-breathing exercises. ? Doing yoga. ? Meditating. ? Doing a body scan. To do this, close your eyes, focus on one area of your body at a time from head to toe. Notice which parts of your body are tense. Try to relax the muscles in those areas. How will I feel when I quit smoking? Day 1 to 3 weeks Within the first 24 hours,  you may start to have some problems that come from quitting tobacco. These problems are very bad 2-3 days after you quit, but they do not often last for more than 2-3 weeks. You may get these symptoms:  Mood swings.  Feeling restless, nervous, angry, or annoyed.  Trouble concentrating.  Dizziness.  Strong desire for high-sugar foods and nicotine.  Weight gain.  Trouble pooping (constipation).  Feeling like you may vomit (nausea).  Coughing or a sore throat.  Changes in how the medicines that you take for other issues work in your body.  Depression.  Trouble sleeping (insomnia). Week 3 and afterward After the first 2-3 weeks of quitting, you may start to notice more positive results, such as:  Better sense of smell and taste.  Less coughing and sore throat.  Slower heart rate.  Lower blood pressure.  Clearer skin.  Better breathing.  Fewer sick days. Quitting smoking can be hard. Do not give up if you fail the first time. Some people need to try a few times before they succeed. Do your best to stick to your quit plan, and talk with your doctor if you have any questions or concerns. Summary  Smoking tobacco is the leading cause of preventable death. Quitting smoking can be hard, but it is one of the best things that you can do for your health.  When you decide to quit smoking, make a plan to help you succeed.  Quit smoking right away, not slowly over a period of time.  When you start quitting, seek help from your doctor, family, or friends. This information is not intended to replace advice given to you by your health care provider. Make sure you discuss any questions you have with your health care provider. Document Revised: 09/01/2019 Document Reviewed: 02/25/2019 Elsevier Patient Education  2020 Elsevier Inc.  

## 2020-01-25 ENCOUNTER — Telehealth: Payer: Self-pay | Admitting: Hematology and Oncology

## 2020-01-25 LAB — IRON AND TIBC
Iron: 71 ug/dL (ref 28–170)
Saturation Ratios: 19 % (ref 10.4–31.8)
TIBC: 381 ug/dL (ref 250–450)
UIBC: 310 ug/dL

## 2020-01-25 LAB — FERRITIN: Ferritin: 74 ng/mL (ref 11–307)

## 2020-01-25 LAB — ERYTHROPOIETIN: Erythropoietin: 8.8 m[IU]/mL (ref 2.6–18.5)

## 2020-01-25 NOTE — Telephone Encounter (Signed)
Scheduled per los. Called and left msg. Mailed printout  °

## 2020-01-26 ENCOUNTER — Telehealth: Payer: Self-pay | Admitting: *Deleted

## 2020-01-26 NOTE — Telephone Encounter (Signed)
-----   Message from Orson Slick, MD sent at 01/26/2020  1:47 PM EST ----- Please call Megan Colon to let her know her WBC has returned back to the normal range. I do believe that her prior elevations in WBC can be best explained by smoking. Encourage smoking cessation. We will see her back in 6 months to reassess.  Megan Colon  ----- Message ----- From: Buel Ream, Lab [facility] [place] Sent: 01/24/2020   2:44 PM EST To: Orson Slick, MD

## 2020-01-26 NOTE — Telephone Encounter (Signed)
TCT patient regarding lab results from earlier appt. Spoke with patient and advised that her WBC have returned to normal. Per Dr. Lorenso Courier, likely her elevated WBC was due to smoking.  Pt voiced understanding. She states she will contact her PCP regarding smoking cessations and nicoderm patches.  Reviewed appts for August 2021

## 2020-04-10 ENCOUNTER — Ambulatory Visit
Admission: EM | Admit: 2020-04-10 | Discharge: 2020-04-10 | Disposition: A | Discharge status: Home / Self Care | Payer: Medicaid Other

## 2020-04-10 DIAGNOSIS — J3489 Other specified disorders of nose and nasal sinuses: Secondary | ICD-10-CM

## 2020-04-10 DIAGNOSIS — R0981 Nasal congestion: Secondary | ICD-10-CM

## 2020-04-10 DIAGNOSIS — G43019 Migraine without aura, intractable, without status migrainosus: Secondary | ICD-10-CM

## 2020-04-10 MED ORDER — AZELASTINE HCL 0.1 % NA SOLN: 2.0000 | Freq: Two times a day (BID) | NASAL | 0 refills | Status: DC

## 2020-04-10 MED ORDER — BUTALBITAL-APAP-CAFFEINE 50-325-40 MG PO TABS: 1.0000 | ORAL_TABLET | Freq: Four times a day (QID) | ORAL | 0 refills | Status: DC | PRN

## 2020-04-10 MED ORDER — PREDNISONE 50 MG PO TABS: 50.0000 mg | ORAL_TABLET | Freq: Every day | ORAL | 0 refills | Status: DC

## 2020-04-10 NOTE — Discharge Instructions (Signed)
COVID PCR testing ordered. I would like you to quarantine until testing results. Start prednisone as directed. Azelastine for nasal congestion/drainage. Tylenol/motrin for pain and fever. Keep hydrated, urine should be clear to pale yellow in color. If experiencing shortness of breath, trouble breathing, go to the emergency department for further evaluation needed.  I have refilled 10 pills of fioricet, please use sparingly. Continue topamax. Follow up with neurology as scheduled for further evaluation needed.

## 2020-04-10 NOTE — ED Provider Notes (Signed)
[name]    CSN: [id number] Arrival date & time: 04/10/20  1041      History   Chief Complaint Chief Complaint  Patient presents with  . Migraine    HPI Megan Colon is a 44 y.o. female.   44 year old female comes in for headache for the past 5 days.  Has also had rhinorrhea, nasal congestion, cough.  Denies fever, chills, body aches.  Denies shortness of breath.  States headache is similar to her migraine headache, and is related to her cycle onset.  She sees neurology, and was started on Topamax for maintenance.  States this has not been helping her symptoms.  She has been taking sumatriptan, which also has not been helping symptoms.  States has been on Fioricet intermittently, which helps with her migraines.  She has an upcoming appointment with neurology for further evaluation.     Past Medical History:  Diagnosis Date  . Arthritis   . GERD (gastroesophageal reflux disease)   . Lung cancer [facility])    Father  . Migraines    since childhood    Patient Active Problem List   Diagnosis Date Noted  . Encounter for induction of labor 05/06/2017  . Gestational hypertension 05/06/2017  . Supervision of high risk pregnancy, antepartum, third trimester 04/30/2017  . Clostridium difficile diarrhea 03/18/2017  . Enteritis due to Clostridium difficile 03/14/2017  . Advanced maternal age in multigravida, unspecified trimester 11/06/2016    Past Surgical History:  Procedure Laterality Date  . CESAREAN SECTION N/A 05/08/2017   Procedure: CESAREAN SECTION;  Surgeon: Florian Buff, MD;  Location: [place];  Service: Obstetrics;  Laterality: N/A;  . LASIK Bilateral 2006    OB History    Gravida  6   Para  2   Term  2   Preterm      AB  3   Living  3     SAB      TAB  3   Ectopic      Multiple      Live Births  3            Home Medications    Prior to Admission medications   Medication Sig Start Date End Date Taking?  Authorizing Provider  SUMAtriptan (IMITREX) 100 MG tablet Take 100 mg by mouth every 2 (two) hours as needed for migraine. May repeat in 2 hours if headache persists or recurs.   Yes [provider]  azelastine (ASTELIN) 0.1 % nasal spray Place 2 sprays into both nostrils 2 (two) times daily. 04/10/20   Ok Edwards, PA-C  butalbital-acetaminophen-caffeine (FIORICET) [phone] MG tablet Take 1-2 tablets by mouth every 6 (six) hours as needed for migraine. 04/10/20 04/10/21  Ok Edwards, PA-C  predniSONE (DELTASONE) 50 MG tablet Take 1 tablet (50 mg total) by mouth daily with breakfast. 04/10/20   Tasia Catchings, Amy V, PA-C  topiramate (TOPAMAX) 50 MG tablet 1/2 pill each bedtime x 1 week, then 1 pill nightly x 1 week, then 1-1/2 pills nightly x 1 week, then 2 pills nightly thereafter. 01/17/20   Star Age, MD    Family History Family History  Problem Relation Age of Onset  . Hypertension Mother   . Diabetes Father   . Cancer Neg Hx   . Stroke Neg Hx     Social History Social History   Tobacco Use  . Smoking status: Current Some Day Smoker    Packs/day: 0.25    Types:  Cigarettes  . Smokeless tobacco: Never Used  Substance Use Topics  . Alcohol use: No  . Drug use: No     Allergies   Zofran [ondansetron hcl] and Oxycodone hcl   Review of Systems Review of Systems  Reason unable to perform ROS: See HPI as above.     Physical Exam Triage Vital Signs ED Triage Vitals [04/10/20 1054]  Enc Vitals Group     BP (!) 167/94     Pulse Rate 94     Resp 16     Temp 98.3 F (36.8 C)     Temp Source Oral     SpO2 97 %     Weight      Height      Head Circumference      Peak Flow      Pain Score 8     Pain Loc      Pain Edu?      Excl. in [place]?    No data found.  Updated Vital Signs BP (!) 167/94 (BP Location: Left Arm)   Pulse 94   Temp 98.3 F (36.8 C) (Oral)   Resp 16   LMP 04/06/2020   SpO2 97%   Physical Exam Constitutional:      General: She is not in acute  distress.    Appearance: Normal appearance. She is not ill-appearing, toxic-appearing or diaphoretic.  HENT:     Head: Normocephalic and atraumatic.     Nose:     Right Sinus: Maxillary sinus tenderness present. No frontal sinus tenderness.     Left Sinus: Maxillary sinus tenderness present. No frontal sinus tenderness.     Mouth/Throat:     Mouth: Mucous membranes are moist.     Pharynx: Oropharynx is clear. Uvula midline.  Eyes:     Extraocular Movements: Extraocular movements intact.     Conjunctiva/sclera: Conjunctivae normal.     Pupils: Pupils are equal, round, and reactive to light.  Cardiovascular:     Rate and Rhythm: Normal rate and regular rhythm.     Heart sounds: Normal heart sounds. No murmur. No friction rub. No gallop.   Pulmonary:     Effort: Pulmonary effort is normal. No accessory muscle usage, prolonged expiration, respiratory distress or retractions.     Comments: Lungs clear to auscultation without adventitious lung sounds. Musculoskeletal:     Cervical back: Normal range of motion and neck supple.  Skin:    General: Skin is warm and dry.  Neurological:     General: No focal deficit present.     Mental Status: She is alert and oriented to person, place, and time.      UC Treatments / Results  Labs (all labs ordered are listed, but only abnormal results are displayed) Labs Reviewed  NOVEL CORONAVIRUS, NAA    EKG   Radiology No results found.  Procedures Procedures (including critical care time)  Medications Ordered in UC Medications - No data to display  Initial Impression / Assessment and Plan / UC Course  I have reviewed the triage vital signs and the nursing notes.  Pertinent labs & imaging results that were available during my care of the patient were reviewed by me and considered in my medical decision making (see chart for details).    COVID PCR test ordered. Patient to quarantine until testing results return. No alarming signs on  exam.  Patient speaking in full sentences without respiratory distress.  Symptomatic treatment discussed. Will refill short course of  Fioricet for migraines.  Push fluids.  Return precautions given.  Patient expresses understanding and agrees to plan.  Final Clinical Impressions(s) / UC Diagnoses   Final diagnoses:  Nasal congestion  Sinus pressure  Intractable migraine without aura and without status migrainosus    ED Prescriptions    Medication Sig Dispense Auth. Provider   butalbital-acetaminophen-caffeine (FIORICET) 50-325-40 MG tablet Take 1-2 tablets by mouth every 6 (six) hours as needed for migraine. 10 tablet Yu, Amy V, PA-C   predniSONE (DELTASONE) 50 MG tablet Take 1 tablet (50 mg total) by mouth daily with breakfast. 5 tablet Yu, Amy V, PA-C   azelastine (ASTELIN) 0.1 % nasal spray Place 2 sprays into both nostrils 2 (two) times daily. 30 mL Tasia Catchings, Amy V, PA-C     I have reviewed the PDMP during this encounter.   Ok Edwards, PA-C 04/10/20 1248

## 2020-04-10 NOTE — ED Triage Notes (Signed)
Pt c/o migraine headache since Saturday. States her meds that her neurologist prescribed, States has an appt with her this month. States gets migraines during her cycles.

## 2020-04-11 LAB — NOVEL CORONAVIRUS, NAA: SARS-CoV-2, NAA: NOT DETECTED

## 2020-04-11 LAB — SARS-COV-2, NAA 2 DAY TAT

## 2020-04-17 ENCOUNTER — Other Ambulatory Visit: Payer: Self-pay

## 2020-04-17 ENCOUNTER — Telehealth: Payer: Self-pay | Admitting: Adult Health

## 2020-04-17 ENCOUNTER — Ambulatory Visit (INDEPENDENT_AMBULATORY_CARE_PROVIDER_SITE_OTHER): Payer: Medicaid Other | Admitting: Adult Health

## 2020-04-17 ENCOUNTER — Encounter: Payer: Self-pay | Admitting: Adult Health

## 2020-04-17 VITALS — BP 125/85 | HR 85 | Temp 97.0°F | Ht 60.0 in | Wt 174.0 lb

## 2020-04-17 DIAGNOSIS — G43019 Migraine without aura, intractable, without status migrainosus: Secondary | ICD-10-CM

## 2020-04-17 MED ORDER — BUTALBITAL-APAP-CAFFEINE 50-325-40 MG PO TABS: 1.0000 | ORAL_TABLET | Freq: Three times a day (TID) | ORAL | 5 refills | Status: AC

## 2020-04-17 MED ORDER — SUMATRIPTAN SUCCINATE 100 MG PO TABS: ORAL_TABLET | ORAL | 11 refills | Status: DC

## 2020-04-17 MED ORDER — TOPIRAMATE 50 MG PO TABS: 150.0000 mg | ORAL_TABLET | Freq: Every day | ORAL | 5 refills | Status: DC

## 2020-04-17 NOTE — Telephone Encounter (Addendum)
Returned call, spoke with [place] who stated the patient called them and said she was told today they would have to refill fioricet and sumatriptan. I reviewed office notes today, advised Verlin Grills patient was given 6 months Fioricet, one year sumatriptan. The NP cautioned her about overuse of both medications which can cause rebound headaches. She has 6 month FU here. Verlin Grills stated she'll call patient to explain, verbalized understanding, appreciation.

## 2020-04-17 NOTE — Patient Instructions (Signed)
Your Plan:  Continue Imitrex and Fioricet: don't use daily as it can cause rebound headaches Increase Topamax 150 mg at bedtime If your symptoms worsen or you develop new symptoms please let us know.    Thank you for coming to see [place] at [facility]. I hope we have been able to provide you high quality care today.  You may receive a patient satisfaction survey over the next few weeks. We would appreciate your feedback and comments so that we may continue to improve ourselves and the health of our patients.

## 2020-04-17 NOTE — Telephone Encounter (Signed)
Megan Colon from Dr. Myrle Sheng office called stating that the pt called them stating that this office told her that after today she from now on has to get her medications filled with Dr. Juleen China and they would like to know why this is. Please advise.

## 2020-04-17 NOTE — Progress Notes (Addendum)
PATIENT: Megan Colon DOB: [birth year]  REASON FOR VISIT: follow up HISTORY FROM: patient  HISTORY OF PRESENT ILLNESS: Today 04/17/20:  Megan Colon is a 44 year old female with a history of migraine headaches.  She returns today for follow-up.  She reports that Topamax has improved her headaches.  She has approximately 2 headaches a week.  They typically last 1 day if she takes Imitrex or Fioricet.  She states that during her menstrual cycle her headaches may last 3 to 4 days even taking her medication.  She recently had a severe headache on April 21.  She went to the urgent care and was given refills of Fioricet and prednisone Dosepak.  She reports that she did not take the prednisone but her headache has improved.  She returns today for follow-up  HISTORY 12/13/2019: She reports having had intermittent chest pain.  She went to the ER with this.  She has not been on any new medication, was supposed to start a blood pressure medication.  She also never started the Topamax as she got confused and thought it was for blood pressure.  She has been taking Fioricet as needed.  She reports that she still has the prescription from September but it looks like she did get a renewal of the Fioricet prescription through her primary care November.  She reports that she does not take it daily.  She would be willing to start the Topamax.  She reports stress, particularly financial constraints.  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  She used to work at a [employer] as a Research scientist (physical sciences).  She has tried Imitrex as needed as prescribed by her primary care nurse practitioner but feels that it causes her more chest pain.   The patient's allergies, current medications, family history, past medical history, past social history, past surgical history and problem list were reviewed and updated as appropriate.   REVIEW OF SYSTEMS: Out of a complete 14 system review of symptoms,  the patient complains only of the following symptoms, and all other reviewed systems are negative.  See HPI  ALLERGIES: Allergies  Allergen Reactions  . Zofran [Ondansetron Hcl] Nausea And Vomiting  . Oxycodone Hcl Itching    HOME MEDICATIONS: Outpatient Medications Prior to Visit  Medication Sig Dispense Refill  . azelastine (ASTELIN) 0.1 % nasal spray Place 2 sprays into both nostrils 2 (two) times daily. 30 mL 0  . butalbital-acetaminophen-caffeine (FIORICET) 50-325-40 MG tablet Take 1-2 tablets by mouth every 6 (six) hours as needed for migraine. 10 tablet 0  . predniSONE (DELTASONE) 50 MG tablet Take 1 tablet (50 mg total) by mouth daily with breakfast. 5 tablet 0  . SUMAtriptan (IMITREX) 100 MG tablet Take 100 mg by mouth every 2 (two) hours as needed for migraine. May repeat in 2 hours if headache persists or recurs.    . topiramate (TOPAMAX) 50 MG tablet 1/2 pill each bedtime x 1 week, then 1 pill nightly x 1 week, then 1-1/2 pills nightly x 1 week, then 2 pills nightly thereafter. 60 tablet 5   No facility-administered medications prior to visit.    PAST MEDICAL HISTORY: Past Medical History:  Diagnosis Date  . Arthritis   . GERD (gastroesophageal reflux disease)   . Lung cancer [facility])    Father  . Migraines    since childhood    PAST SURGICAL HISTORY: Past Surgical History:  Procedure Laterality Date  . CESAREAN SECTION N/A 05/08/2017  Procedure: CESAREAN SECTION;  Surgeon: Florian Buff, MD;  Location: [place];  Service: Obstetrics;  Laterality: N/A;  . LASIK Bilateral 2006    FAMILY HISTORY: Family History  Problem Relation Age of Onset  . Hypertension Mother   . Diabetes Father   . Cancer Neg Hx   . Stroke Neg Hx     SOCIAL HISTORY: Social History   Socioeconomic History  . Marital status: Single    Spouse name: Not on file  . Number of children: Not on file  . Years of education: Not on file  . Highest education level: Not on file    Occupational History  . Not on file  Tobacco Use  . Smoking status: Current Some Day Smoker    Packs/day: 0.25    Types: Cigarettes  . Smokeless tobacco: Never Used  Substance and Sexual Activity  . Alcohol use: No  . Drug use: No  . Sexual activity: Yes    Birth control/protection: Condom  Other Topics Concern  . Not on file  Social History Narrative  . Not on file   Social Determinants of Health   Financial Resource Strain:   . Difficulty of Paying Living Expenses:   Food Insecurity:   . Worried About Charity fundraiser in the Last Year:   . Arboriculturist in the Last Year:   Transportation Needs:   . Film/video editor (Medical):   Marland Kitchen Lack of Transportation (Non-Medical):   Physical Activity:   . Days of Exercise per Week:   . Minutes of Exercise per Session:   Stress:   . Feeling of Stress :   Social Connections:   . Frequency of Communication with Friends and Family:   . Frequency of Social Gatherings with Friends and Family:   . Attends Religious Services:   . Active Member of Clubs or Organizations:   . Attends Archivist Meetings:   Marland Kitchen Marital Status:   Intimate Partner Violence:   . Fear of Current or Ex-Partner:   . Emotionally Abused:   Marland Kitchen Physically Abused:   . Sexually Abused:       PHYSICAL EXAM  Vitals:   04/17/20 0926  BP: 125/85  Pulse: 85  Temp: (!) 97 F (36.1 C)  Weight: 174 lb (78.9 kg)  Height: 5' (1.524 m)   Body mass index is 33.98 kg/m.  Generalized: Well developed, in no acute distress   Neurological examination  Mentation: Alert oriented to time, place, history taking. Follows all commands speech and language fluent Cranial nerve II-XII: Pupils were equal round reactive to light. Extraocular movements were full, visual field were full on confrontational test.  Head turning and shoulder shrug  were normal and symmetric. Motor: The motor testing reveals 5 over 5 strength of all 4 extremities. Good symmetric  motor tone is noted throughout.  Sensory: Sensory testing is intact to soft touch on all 4 extremities. No evidence of extinction is noted.  Coordination: Cerebellar testing reveals good finger-nose-finger and heel-to-shin bilaterally.  Gait and station: Gait is normal.  Reflexes: Deep tendon reflexes are symmetric and normal bilaterally.   DIAGNOSTIC DATA (LABS, IMAGING, TESTING) - I reviewed patient records, labs, notes, testing and imaging myself where available.  Lab Results  Component Value Date   WBC 10.0 01/24/2020   HGB 14.2 01/24/2020   HCT 42.4 01/24/2020   MCV 90.4 01/24/2020   PLT 269 01/24/2020      Component Value Date/Time  NA 141 01/24/2020 1425   NA 140 11/08/2019 1008   K 3.6 01/24/2020 1425   CL 107 01/24/2020 1425   CO2 24 01/24/2020 1425   GLUCOSE 79 01/24/2020 1425   BUN 16 01/24/2020 1425   BUN 10 11/08/2019 1008   CREATININE 0.69 01/24/2020 1425   CALCIUM 9.2 01/24/2020 1425   PROT 7.5 01/24/2020 1425   PROT 7.9 11/08/2019 1008   ALBUMIN 4.4 01/24/2020 1425   ALBUMIN 5.3 (H) 11/08/2019 1008   AST 9 (L) 01/24/2020 1425   ALT 18 01/24/2020 1425   ALKPHOS 64 01/24/2020 1425   BILITOT <0.2 (L) 01/24/2020 1425   GFRNONAA >60 01/24/2020 1425   GFRAA >60 01/24/2020 1425   Lab Results  Component Value Date   CHOL 175 11/08/2019   HDL 54 11/08/2019   LDLCALC 108 (H) 11/08/2019   TRIG 66 11/08/2019   CHOLHDL 3.2 11/08/2019   Lab Results  Component Value Date   HGBA1C 5.2 01/24/2020    Lab Results  Component Value Date   TSH 1.500 11/08/2019      ASSESSMENT AND PLAN 44 y.o. year old female  has a past medical history of Arthritis, GERD (gastroesophageal reflux disease), Lung cancer ([place]), and Migraines. here with :  1.  Migraine headaches  -Increase Topamax 150 mg at bedtime -Continue Fioricet and Imitrex: I did caution the patient to avoid medication overuse as this will cause rebound headaches -In the future we may try injectable  medication (ajovy) -Advised if headaches worsen or she develops new symptoms she should let us know. -Follow-up in 6 months or sooner if needed   I spent 30 minutes of face-to-face and non-face-to-face time with patient.  This included previsit chart review, lab review, study review, order entry, electronic health record documentation, patient education.  Ward Givens, MSN, NP-C 04/17/2020, 9:43 AM [facility] [address], [place], [place] [address] [phone]  I reviewed the above note and documentation by the Nurse Practitioner and agree with the history, exam, assessment and plan as outlined above. I was available for consultation. Star Age, MD, PhD [facility] [place])

## 2020-04-25 ENCOUNTER — Telehealth: Payer: Medicaid Other | Admitting: Internal Medicine

## 2020-04-29 ENCOUNTER — Encounter: Payer: Self-pay | Admitting: Internal Medicine

## 2020-04-29 ENCOUNTER — Telehealth (INDEPENDENT_AMBULATORY_CARE_PROVIDER_SITE_OTHER): Payer: Medicaid Other | Admitting: Internal Medicine

## 2020-04-29 DIAGNOSIS — Z8759 Personal history of other complications of pregnancy, childbirth and the puerperium: Secondary | ICD-10-CM

## 2020-04-29 DIAGNOSIS — Z1231 Encounter for screening mammogram for malignant neoplasm of breast: Secondary | ICD-10-CM | POA: Diagnosis not present

## 2020-04-29 DIAGNOSIS — K219 Gastro-esophageal reflux disease without esophagitis: Secondary | ICD-10-CM

## 2020-04-29 DIAGNOSIS — Z716 Tobacco abuse counseling: Secondary | ICD-10-CM

## 2020-04-29 DIAGNOSIS — E669 Obesity, unspecified: Secondary | ICD-10-CM | POA: Diagnosis not present

## 2020-04-29 DIAGNOSIS — E66811 Obesity, class 1: Secondary | ICD-10-CM

## 2020-04-29 MED ORDER — PANTOPRAZOLE SODIUM 40 MG PO TBEC: 40.0000 mg | DELAYED_RELEASE_TABLET | Freq: Every day | ORAL | 1 refills | Status: DC

## 2020-04-29 MED ORDER — PHENTERMINE HCL 37.5 MG PO CAPS: 37.5000 mg | ORAL_CAPSULE | ORAL | 0 refills | Status: DC

## 2020-04-29 MED ORDER — NICOTINE 14 MG/24HR TD PT24: 14.0000 mg | MEDICATED_PATCH | Freq: Every day | TRANSDERMAL | 0 refills | Status: DC

## 2020-04-29 NOTE — Progress Notes (Signed)
Virtual Visit via Telephone Note  I connected with Megan Colon, on 04/29/2020 at 8:43 AM by telephone due to the COVID-19 pandemic and verified that I am speaking with the correct person using two identifiers.   Consent: I discussed the limitations, risks, security and privacy concerns of performing an evaluation and management service by telephone and the availability of in person appointments. I also discussed with the patient that there may be a patient responsible charge related to this service. The patient expressed understanding and agreed to proceed.   Location of Patient: Home   Location of Provider: Clinic    Persons participating in Telemedicine visit: Megan Colon [facility] Dr. Juleen China      History of Present Illness: Patient has a visit to address several concerns.   Patient would like to discuss diet pills. She was on them about 15 years ago and said they worked well for her. Adipex was the Rx. She was able to maintain weight for several years until she had a child again 2 years ago. She is motivated to lose weight but feels like she needs a jump start.   She is having trouble with heartburn. It wakes her up in the middle of the night. She is taking Tums. She stopped eating past 1830 each night.      Past Medical History:  Diagnosis Date  . Arthritis   . GERD (gastroesophageal reflux disease)   . Lung cancer [facility])    Father  . Migraines    since childhood   Allergies  Allergen Reactions  . Zofran [Ondansetron Hcl] Nausea And Vomiting  . Oxycodone Hcl Itching    Current Outpatient Medications on File Prior to Visit  Medication Sig Dispense Refill  . butalbital-acetaminophen-caffeine (FIORICET) 50-325-40 MG tablet Take 1-2 tablets by mouth every 8 (eight) hours. 15 tablet 5  . SUMAtriptan (IMITREX) 100 MG tablet Take 1 tablet at the onset of migraine. May repeat in 2 hours if headache persists or recurs. 10 tablet 11  . topiramate (TOPAMAX) 50 MG  tablet Take 3 tablets (150 mg total) by mouth at bedtime. 90 tablet 5   No current facility-administered medications on file prior to visit.    Observations/Objective: NAD. Speaking clearly.  Work of breathing normal.  Alert and oriented. Mood appropriate.   Assessment and Plan: 1. Gastroesophageal reflux disease, unspecified whether esophagitis present Will do trial of PPI for one month and then prn use. Counseled on dietary changes/foods that are potential triggers. Weight loss will also likely help. If uncontrolled with PPI, will refer to GI.  - pantoprazole (PROTONIX) 40 MG tablet; Take 1 tablet (40 mg total) by mouth daily.  Dispense: 30 tablet; Refill: 1  2. Encounter for smoking cessation counseling Patient currently smokes slightly <1 ppd. Spent 4 minutes counseling on dangers of tobacco use and benefits of quitting, offered pharmacological intervention to aid quitting and patient is ready to quit. Therapy started: nicotine patch per patient request. Also motivated to quit as heme thought leukocytosis and high HgB likely related to tobacco use.  - nicotine (NICODERM CQ - DOSED IN MG/24 HOURS) 14 mg/24hr patch; Place 1 patch (14 mg total) onto the skin daily.  Dispense: 28 patch; Refill: 0  3. History of gestational hypertension Discussed with patient that we will need her to f/u within 2-3 weeks for BP check. She has a history of gHTN, not currently on any medications. Last BP was normal; however has had some moderately elevated BPs recorded over the past  year. Would like to ensure she does not have uncontrolled HTN given start of diet pills and nicotine patch.   4. Encounter for screening mammogram for malignant neoplasm of breast - MM DIGITAL SCREENING BILATERAL; Future  5. Class 1 obesity without serious comorbidity in adult, unspecified BMI, unspecified obesity type Has no contraindications to phentermine. Plan to f/u on BP. Will need monthly weight/waist circumference checks  for the first 3 months of therapy. Counseled on 150 minutes of exercise per week, healthy eating (including decreased daily intake of saturated fats, cholesterol, added sugars, sodium). - phentermine 37.5 MG capsule; Take 1 capsule (37.5 mg total) by mouth every morning.  Dispense: 90 capsule; Refill: 0    Follow Up Instructions: BP f/u    I discussed the assessment and treatment plan with the patient. The patient was provided an opportunity to ask questions and all were answered. The patient agreed with the plan and demonstrated an understanding of the instructions.   The patient was advised to call back or seek an in-person evaluation if the symptoms worsen or if the condition fails to improve as anticipated.     I provided 26 minutes total of non-face-to-face time during this encounter including median intraservice time, reviewing previous notes, investigations, ordering medications, medical decision making, coordinating care and patient verbalized understanding at the end of the visit.    Phill Myron, D.O. Primary Care at [facility]  04/29/2020, 8:43 AM

## 2020-05-10 ENCOUNTER — Telehealth: Payer: Self-pay

## 2020-05-10 NOTE — Telephone Encounter (Signed)

## 2020-05-13 ENCOUNTER — Ambulatory Visit: Payer: Medicaid Other | Admitting: Internal Medicine

## 2020-05-31 ENCOUNTER — Telehealth: Payer: Self-pay

## 2020-05-31 NOTE — Telephone Encounter (Signed)

## 2020-06-03 ENCOUNTER — Encounter: Payer: Self-pay | Admitting: Internal Medicine

## 2020-06-03 ENCOUNTER — Ambulatory Visit (INDEPENDENT_AMBULATORY_CARE_PROVIDER_SITE_OTHER): Payer: Medicaid Other | Admitting: Internal Medicine

## 2020-06-03 ENCOUNTER — Other Ambulatory Visit: Payer: Self-pay

## 2020-06-03 VITALS — BP 125/80 | HR 102 | Temp 97.3°F | Resp 17 | Ht 60.0 in | Wt 166.0 lb

## 2020-06-03 DIAGNOSIS — Z5181 Encounter for therapeutic drug level monitoring: Secondary | ICD-10-CM | POA: Diagnosis not present

## 2020-06-03 DIAGNOSIS — Z1159 Encounter for screening for other viral diseases: Secondary | ICD-10-CM | POA: Diagnosis not present

## 2020-06-03 DIAGNOSIS — J309 Allergic rhinitis, unspecified: Secondary | ICD-10-CM

## 2020-06-03 DIAGNOSIS — K5900 Constipation, unspecified: Secondary | ICD-10-CM | POA: Diagnosis not present

## 2020-06-03 DIAGNOSIS — E669 Obesity, unspecified: Secondary | ICD-10-CM | POA: Diagnosis not present

## 2020-06-03 DIAGNOSIS — H1013 Acute atopic conjunctivitis, bilateral: Secondary | ICD-10-CM

## 2020-06-03 MED ORDER — POLYETHYLENE GLYCOL 3350 17 GM/SCOOP PO POWD: 17.0000 g | Freq: Every day | ORAL | 1 refills | Status: DC

## 2020-06-03 MED ORDER — FLUTICASONE PROPIONATE 50 MCG/ACT NA SUSP: 2.0000 | Freq: Every day | NASAL | 6 refills | Status: DC

## 2020-06-03 NOTE — Progress Notes (Signed)
Subjective:    Megan Colon - 44 y.o. female MRN [id number]  Date of birth: Oct 08, 1976  HPI  Megan Colon is here for to follow up on weight loss. She was started on Phentermine 37.5 mg daily on 5/10. Reports she is tolerating the medication well.   Does have concerns about constipation. Has been occurring since her child was born 3 years ago. Does not have regular bowel movements and then feels bloated. Has not tried anything because she wasn't sure what was safe to take.   Also has concerns about itchy, watery red eyes, nasal congestion, rhinorrhea, and sneezing. Has been occurring for the past 1-2 months and the same thing happened last summer. Denies fevers and cough. Has not tried taking anything.    Health Maintenance:  Health Maintenance Due  Topic Date Due   Hepatitis C Screening  Never done   COVID-19 Vaccine (1) Never done   PAP SMEAR-Modifier  10/08/2019    -  reports that she has been smoking cigarettes. She has been smoking about 0.25 packs per day. She has never used smokeless tobacco. - Review of Systems: Per HPI. - Past Medical History: Patient Active Problem List   Diagnosis Date Noted   Gestational hypertension 05/06/2017   - Medications: reviewed and updated   Objective:   Physical Exam BP 125/80    Pulse (!) 102    Temp (!) 97.3 F (36.3 C) (Temporal)    Resp 17    Ht 5' (1.524 m)    Wt 166 lb (75.3 kg)    SpO2 96%    BMI 32.42 kg/m  Physical Exam Constitutional:      General: She is not in acute distress.    Appearance: She is not diaphoretic.  Cardiovascular:     Rate and Rhythm: Normal rate.  Pulmonary:     Effort: Pulmonary effort is normal. No respiratory distress.  Musculoskeletal:        General: Normal range of motion.  Skin:    General: Skin is warm and dry.  Neurological:     Mental Status: She is alert and oriented to person, place, and time.  Psychiatric:        Mood and Affect: Affect normal.        Judgment: Judgment normal.             Assessment & Plan:   1. Class 1 obesity without serious comorbidity in adult, unspecified BMI, unspecified obesity type Weight has improved from 174 lbs to 166 lbs since starting Phentermine. Discussed that this is a short term medication and would not prescribe longer than 12 weeks due to potential side effects and abuse potential. Encouraged continued changes in diet and exercise to promote healthy weight loss. Return in 4 weeks.   2. Medication monitoring encounter BP is stable with Phentermine use. HR is slightly high although seems to have been that way in the past prior to medication addition. Will continue to monitor.   3. Need for hepatitis C screening test - Hepatitis C Antibody  4. Constipation, unspecified constipation type Discussed need for adequate hydration and fiber intake.  - polyethylene glycol powder (GLYCOLAX/MIRALAX) 17 GM/SCOOP powder; Take 17 g by mouth daily.  Dispense: 3350 g; Refill: 1  5. 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 Have prescribed Flonase and recommended OTC daily antihistamine.  - fluticasone (FLONASE) 50 MCG/ACT nasal spray; Place 2 sprays into both nostrils daily.  Dispense: 16 g; Refill: 6  Phill Myron, D.O. 06/03/2020, 2:11 PM Primary Care at [facility]

## 2020-06-03 NOTE — Patient Instructions (Signed)
I would recommend a store brand of a daily allergy medication such as Zyrtec, Allegra, or Claritin.

## 2020-06-05 LAB — HEPATITIS C ANTIBODY: Hep C Virus Ab: <0.1 s/co ratio (ref 0.0–0.9)

## 2020-06-05 NOTE — Progress Notes (Signed)
Normal lab letter mailed.

## 2020-07-10 ENCOUNTER — Telehealth: Payer: Self-pay

## 2020-07-10 NOTE — Telephone Encounter (Signed)

## 2020-07-11 ENCOUNTER — Encounter: Payer: Self-pay | Admitting: Internal Medicine

## 2020-07-11 ENCOUNTER — Ambulatory Visit (INDEPENDENT_AMBULATORY_CARE_PROVIDER_SITE_OTHER): Payer: Medicaid Other

## 2020-07-11 ENCOUNTER — Other Ambulatory Visit: Payer: Self-pay

## 2020-07-11 ENCOUNTER — Ambulatory Visit (INDEPENDENT_AMBULATORY_CARE_PROVIDER_SITE_OTHER): Payer: Medicaid Other | Admitting: Internal Medicine

## 2020-07-11 VITALS — BP 145/92 | HR 108 | Temp 97.3°F | Resp 17 | Wt 167.0 lb

## 2020-07-11 DIAGNOSIS — F17200 Nicotine dependence, unspecified, uncomplicated: Secondary | ICD-10-CM

## 2020-07-11 DIAGNOSIS — M542 Cervicalgia: Secondary | ICD-10-CM

## 2020-07-11 DIAGNOSIS — E669 Obesity, unspecified: Secondary | ICD-10-CM

## 2020-07-11 MED ORDER — VARENICLINE TARTRATE 0.5 MG PO TABS: ORAL_TABLET | ORAL | 1 refills | Status: DC

## 2020-07-11 NOTE — Progress Notes (Signed)
Subjective:    Megan Colon - 44 y.o. female MRN [id number]  Date of birth: [birth year]  HPI  Megan Colon is here for to follow up on weight loss. She was started on Phentermine 37.5 mg daily on 5/10. Reports she stopped taking the medication because it was making her moody. Discontinued it five days ago.   Reports chronic neck pain for >1 year. Reports neck feels stiff like unable to turn in certain directions and has tension that radiates down into her upper back and shoulders. Does seem to worsen with her migraine headaches. No known injury. No UE weakness or reduced grip strength. No fevers.    Health Maintenance:  Health Maintenance Due  Topic Date Due  . COVID-19 Vaccine (1) Never done  . PAP SMEAR-Modifier  10/08/2019    -  reports that she has been smoking cigarettes. She has been smoking about 0.25 packs per day. She has never used smokeless tobacco. - Review of Systems: Per HPI. - Past Medical History: Patient Active Problem List   Diagnosis Date Noted  . Gestational hypertension 05/06/2017   - Medications: reviewed and updated   Objective:   Physical Exam BP (!) 145/92   Pulse (!) 108   Temp (!) 97.3 F (36.3 C) (Temporal)   Resp 17   Wt 167 lb (75.8 kg)   SpO2 97%   BMI 32.61 kg/m  Physical Exam Constitutional:      General: She is not in acute distress.    Appearance: She is not diaphoretic.  HENT:     Head: Normocephalic and atraumatic.  Eyes:     Conjunctiva/sclera: Conjunctivae normal.  Cardiovascular:     Rate and Rhythm: Normal rate and regular rhythm.     Heart sounds: Normal heart sounds. No murmur heard.   Pulmonary:     Effort: Pulmonary effort is normal. No respiratory distress.     Breath sounds: Normal breath sounds.  Musculoskeletal:     Comments: Limited active ROM with rotation to the left and right. Flexion and extension preserved.   Skin:    General: Skin is warm and dry.  Neurological:     Mental Status: She is alert and oriented  to person, place, and time.  Psychiatric:        Mood and Affect: Affect normal.        Judgment: Judgment normal.            Assessment & Plan:   1. Class 1 obesity without serious comorbidity in adult, unspecified BMI, unspecified obesity type Discontinue phenteramine due to presumed side effects. Refer to medical weight management. Weight has stayed stable since last visit.  - Amb Ref to Medical Weight Management  2. Tobacco use disorder Patient currently smokes >1 ppd. Spent 4 minutes counseling on dangers of tobacco use and benefits of quitting, offered pharmacological intervention to aid quitting and patient is ready to quit. Therapy started: Chantix. Had tried nicotine patches but was unable to quit with these and still smoking despite wearing patch. May explain why BP is higher than it typically is.  - varenicline (CHANTIX) 0.5 MG tablet; Take one tablet daily days 1-3. Then take two tablets daily days 4-7. After one week, increase to two tablets twice per day and continue for at least 11 weeks.  Dispense: 180 tablet; Refill: 1  3. Neck pain Will obtain x-ray imaging of C spine. May be related to chronic migraine headaches and tension in the muscles. If neck xray imaging negative,  would consider trial of PT and muscle relaxer.  - DG Cervical Spine Complete; Future     Phill Myron, D.O. 07/11/2020, 10:13 AM Primary Care at [facility]

## 2020-07-12 ENCOUNTER — Encounter: Payer: Self-pay | Admitting: Internal Medicine

## 2020-07-12 DIAGNOSIS — M5030 Other cervical disc degeneration, unspecified cervical region: Secondary | ICD-10-CM | POA: Insufficient documentation

## 2020-07-22 ENCOUNTER — Other Ambulatory Visit: Payer: Self-pay | Admitting: Hematology and Oncology

## 2020-07-22 ENCOUNTER — Other Ambulatory Visit: Payer: Self-pay | Admitting: Internal Medicine

## 2020-07-22 DIAGNOSIS — D72823 Leukemoid reaction: Secondary | ICD-10-CM

## 2020-07-22 DIAGNOSIS — M5030 Other cervical disc degeneration, unspecified cervical region: Secondary | ICD-10-CM

## 2020-07-22 DIAGNOSIS — G4700 Insomnia, unspecified: Secondary | ICD-10-CM

## 2020-07-22 MED ORDER — TRAZODONE HCL 50 MG PO TABS: 25.0000 mg | ORAL_TABLET | Freq: Every evening | ORAL | 3 refills | Status: DC | PRN

## 2020-07-22 NOTE — Progress Notes (Signed)
[place] [phone]   [phone]  PROGRESS NOTE  Patient Care Team: Nicolette Bang, DO as PCP - General (Family Medicine)  Hematological/Oncological History #Leukocytosis 1) 09/17/2016: WBC 13.5, Hgb 14.0, Plt 258, Hct 42.2. First elevated WBC on our records 2) 05/08/2017: WBC 24.1, Hgb 13.4, Plt 155. Day of C section, delivery of healthy baby boy.  3) 11/08/2019: WBC 12.2, Hgb 16.5, Hct 49.8, Plt 264.  4) 01/24/2020: establish care with Dr. Lorenso Courier. WBC 10.0, Hgb 14.2, MCV 42.4, Plt 269.  5) 07/24/2020: WBC 9.8, Hgb 15.2, MCV 90.2, Plt 251  Interval History:  Megan Colon 44 y.o. female with medical history significant for leukocytosis who presents for a follow up visit. The patient's last visit was on 01/24/2020 at which time she establish care. In the interim since the last visit her WBC was found to return to normal at 10.0 during her last visit.   On exam today Megan Colon reports that she still does have occasional episodes of chills and that she does always feel cold.  She does have occasional episodes of dizziness.  She does have headaches for which she recently received a plain film of the neck with concern for degenerative changes in the spine.  She has an upcoming appointment with orthopedics where she is hoping this will be addressed.  On further discussion she notes that she has reduced her smoking down to 6 cigarettes/day which is less than half a pack per day.  She notes that she was prescribed Chantix, however due to the recall of this medication she was unable to obtain it.  It does appear as though the medication will come back on the market once the purity standards of change and therefore she may have an opportunity to take this medication.  She reports that she has not been having any issues with fevers, nausea, vomiting, or diarrhea.  She is actually been gaining weight in the interim since her last visit.  Otherwise a full 10 point  ROS is listed below.  MEDICAL HISTORY:  Past Medical History:  Diagnosis Date  . Arthritis   . GERD (gastroesophageal reflux disease)   . Lung cancer [facility])    Father  . Migraines    since childhood    SURGICAL HISTORY: Past Surgical History:  Procedure Laterality Date  . CESAREAN SECTION N/A 05/08/2017   Procedure: CESAREAN SECTION;  Surgeon: Florian Buff, MD;  Location: [place];  Service: Obstetrics;  Laterality: N/A;  . LASIK Bilateral 2006    SOCIAL HISTORY: Social History   Socioeconomic History  . Marital status: Single    Spouse name: Not on file  . Number of children: Not on file  . Years of education: Not on file  . Highest education level: Not on file  Occupational History  . Not on file  Tobacco Use  . Smoking status: Current Some Day Smoker    Packs/day: 0.25    Types: Cigarettes  . Smokeless tobacco: Never Used  Substance and Sexual Activity  . Alcohol use: No  . Drug use: No  . Sexual activity: Yes    Birth control/protection: Condom  Other Topics Concern  . Not on file  Social History Narrative  . Not on file   Social Determinants of Health   Financial Resource Strain:   . Difficulty of Paying Living Expenses:   Food Insecurity:   . Worried About Charity fundraiser in the Last Year:   . Ran  Out of Food in the Last Year:   Transportation Needs:   . Lack of Transportation (Medical):   Marland Kitchen Lack of Transportation (Non-Medical):   Physical Activity:   . Days of Exercise per Week:   . Minutes of Exercise per Session:   Stress:   . Feeling of Stress :   Social Connections:   . Frequency of Communication with Friends and Family:   . Frequency of Social Gatherings with Friends and Family:   . Attends Religious Services:   . Active Member of Clubs or Organizations:   . Attends Archivist Meetings:   Marland Kitchen Marital Status:   Intimate Partner Violence:   . Fear of Current or Ex-Partner:   . Emotionally Abused:   Marland Kitchen Physically  Abused:   . Sexually Abused:     FAMILY HISTORY: Family History  Problem Relation Age of Onset  . Hypertension Mother   . Diabetes Father   . Cancer Neg Hx   . Stroke Neg Hx     ALLERGIES:  is allergic to zofran [ondansetron hcl] and oxycodone hcl.  MEDICATIONS:  Current Outpatient Medications  Medication Sig Dispense Refill  . butalbital-acetaminophen-caffeine (FIORICET) 50-325-40 MG tablet Take 1-2 tablets by mouth every 8 (eight) hours. 15 tablet 5  . fluticasone (FLONASE) 50 MCG/ACT nasal spray Place 2 sprays into both nostrils daily. 16 g 6  . pantoprazole (PROTONIX) 40 MG tablet Take 1 tablet (40 mg total) by mouth daily. 30 tablet 1  . polyethylene glycol powder (GLYCOLAX/MIRALAX) 17 GM/SCOOP powder Take 17 g by mouth daily. 3350 g 1  . SUMAtriptan (IMITREX) 100 MG tablet Take 1 tablet at the onset of migraine. May repeat in 2 hours if headache persists or recurs. (Patient not taking: Reported on 07/11/2020) 10 tablet 11  . traZODone (DESYREL) 50 MG tablet Take 0.5-1 tablets (25-50 mg total) by mouth at bedtime as needed for sleep. 30 tablet 3  . varenicline (CHANTIX) 0.5 MG tablet Take one tablet daily days 1-3. Then take two tablets daily days 4-7. After one week, increase to two tablets twice per day and continue for at least 11 weeks. 180 tablet 1   No current facility-administered medications for this visit.    REVIEW OF SYSTEMS:   Constitutional: ( - ) fevers, ( - )  chills , ( - ) night sweats Eyes: ( - ) blurriness of vision, ( - ) double vision, ( - ) watery eyes Ears, nose, mouth, throat, and face: ( - ) mucositis, ( - ) sore throat Respiratory: ( - ) cough, ( - ) dyspnea, ( - ) wheezes Cardiovascular: ( - ) palpitation, ( - ) chest discomfort, ( - ) lower extremity swelling Gastrointestinal:  ( - ) nausea, ( - ) heartburn, ( - ) change in bowel habits Skin: ( - ) abnormal skin rashes Lymphatics: ( - ) new lymphadenopathy, ( - ) easy bruising Neurological: ( - )  numbness, ( - ) tingling, ( - ) new weaknesses Behavioral/Psych: ( - ) mood change, ( - ) new changes  All other systems were reviewed with the patient and are negative.  PHYSICAL EXAMINATION: Vitals:   07/24/20 1006  BP: (!) 141/92  Pulse: 99  Resp: 18  Temp: (!) 97.5 F (36.4 C)  SpO2: 100%   Filed Weights   07/24/20 1006  Weight: 171 lb 14.4 oz (78 kg)    GENERAL: alert, no distress and comfortable SKIN: skin color, texture, turgor are normal, no rashes or  significant lesions EYES: conjunctiva are pink and non-injected, sclera clear OROPHARYNX: no exudate, no erythema; lips, buccal mucosa, and tongue normal  NECK: supple, non-tender LYMPH:  no palpable lymphadenopathy in the cervical, axillary or inguinal LUNGS: clear to auscultation and percussion with normal breathing effort HEART: regular rate & rhythm and no murmurs and no lower extremity edema ABDOMEN: soft, non-tender, non-distended, normal bowel sounds Musculoskeletal: no cyanosis of digits and no clubbing  PSYCH: alert & oriented x 3, fluent speech NEURO: no focal motor/sensory deficits  LABORATORY DATA:  I have reviewed the data as listed CBC Latest Ref Rng & Units 07/24/2020 01/24/2020 11/08/2019  WBC 4.0 - 10.5 K/uL 9.8 10.0 12.2(H)  Hemoglobin 12.0 - 15.0 g/dL 15.2(H) 14.2 16.5(H)  Hematocrit 36 - 46 % 45.2 42.4 49.8(H)  Platelets 150 - 400 K/uL 251 269 264    CMP Latest Ref Rng & Units 07/24/2020 01/24/2020 11/08/2019  Glucose 70 - 99 mg/dL 127(H) 79 96  BUN 6 - 20 mg/dL 10 16 10   Creatinine 0.44 - 1.00 mg/dL 0.77 0.69 0.76  Sodium 135 - 145 mmol/L 138 141 140  Potassium 3.5 - 5.1 mmol/L 3.9 3.6 3.6  Chloride 98 - 111 mmol/L 102 107 100  CO2 22 - 32 mmol/L 26 24 24   Calcium 8.9 - 10.3 mg/dL 9.8 9.2 10.0  Total Protein 6.5 - 8.1 g/dL 7.4 7.5 7.9  Total Bilirubin 0.3 - 1.2 mg/dL 0.4 <0.2(L) 0.4  Alkaline Phos 38 - 126 U/L 60 64 67  AST 15 - 41 U/L 10(L) 9(L) 14  ALT 0 - 44 U/L 16 18 14     No results  found for: MPROTEIN No results found for: KPAFRELGTCHN, LAMBDASER, KAPLAMBRATIO   RADIOGRAPHIC STUDIES: DG Cervical Spine Complete  Result Date: 07/12/2020 CLINICAL DATA:  Chronic neck pain. EXAM: CERVICAL SPINE - COMPLETE 4+ VIEW COMPARISON:  No prior. FINDINGS: Loss of normal cervical lordosis. Mild degenerative change C5-C6, C6-C7, and C7-T1 with mild disc degeneration and endplate osteophyte formation. No acute bony or joint abnormality. No evidence of fracture or dislocation. IMPRESSION: Loss of normal cervical lordosis. Mild degenerative changes lower cervical spine. No acute abnormality identified. Electronically Signed   By: Marcello Moores  Register   On: 07/12/2020 16:06    ASSESSMENT & PLAN Megan Colon 44 y.o. female with medical history significant for leukocytosis who presents for a follow up visit.  After review the labs, review the records, discussion with the patient her findings most consistent with leukocytosis secondary to smoking.  As she is decreased her smoking the white blood cell count has subsequently returned to normal.  She still does have a mild elevation in hemoglobin today, but once again this is likely secondary to her smoking habits.  Given that this has resolved and is now stable I do not think there is any need for routine follow-up in our clinic.  I would recommend that the patient undergo routine CBCs with her primary care provider in the event that her blood counts were to become abnormal again she could be rereferred.  Patient can return to clinic as needed.  #Leukocytosis, resolved --repeat WBC on 01/24/2020 and today showed no evidence of leukocytosis. No other abnormalities were noted during our prior lab evaluation.  --will recheck CBC and CMP today. If normal, can d/c patient from the clinic. --most likely etiology is the patient's smoking. Encourage smoking cessation.  --RTC PRN. Continue routine CBC with PCP. Please re-refer if the patient were to have new  concerning symptoms.  No orders of the defined types were placed in this encounter.   All questions were answered. The patient knows to call the clinic with any problems, questions or concerns.  A total of more than 25 minutes were spent on this encounter and over half of that time was spent on counseling and coordination of care as outlined above.   Ledell Peoples, MD Department of Hematology/Oncology [facility] Phone: [phone] Pager: [phone] Email: [email or web address]  07/24/2020 11:53 AM

## 2020-07-24 ENCOUNTER — Inpatient Hospital Stay: Payer: Medicaid Other

## 2020-07-24 ENCOUNTER — Encounter: Payer: Self-pay | Admitting: Hematology and Oncology

## 2020-07-24 ENCOUNTER — Inpatient Hospital Stay: Payer: Medicaid Other | Attending: Hematology and Oncology | Admitting: Hematology and Oncology

## 2020-07-24 ENCOUNTER — Other Ambulatory Visit: Payer: Self-pay

## 2020-07-24 VITALS — BP 141/92 | HR 99 | Temp 97.5°F | Resp 18 | Ht 60.0 in | Wt 171.9 lb

## 2020-07-24 DIAGNOSIS — F17200 Nicotine dependence, unspecified, uncomplicated: Secondary | ICD-10-CM | POA: Diagnosis not present

## 2020-07-24 DIAGNOSIS — F17210 Nicotine dependence, cigarettes, uncomplicated: Secondary | ICD-10-CM | POA: Insufficient documentation

## 2020-07-24 DIAGNOSIS — D72829 Elevated white blood cell count, unspecified: Secondary | ICD-10-CM | POA: Insufficient documentation

## 2020-07-24 DIAGNOSIS — D72823 Leukemoid reaction: Secondary | ICD-10-CM

## 2020-07-24 DIAGNOSIS — Z85118 Personal history of other malignant neoplasm of bronchus and lung: Secondary | ICD-10-CM | POA: Insufficient documentation

## 2020-07-24 LAB — CMP (CANCER CENTER ONLY)
ALT: 16 U/L (ref 0–44)
AST: 10 U/L — ABNORMAL LOW (ref 15–41)
Albumin: 4.3 g/dL (ref 3.5–5.0)
Alkaline Phosphatase: 60 U/L (ref 38–126)
Anion gap: 10 (ref 5–15)
BUN: 10 mg/dL (ref 6–20)
CO2: 26 mmol/L (ref 22–32)
Calcium: 9.8 mg/dL (ref 8.9–10.3)
Chloride: 102 mmol/L (ref 98–111)
Creatinine: 0.77 mg/dL (ref 0.44–1.00)
GFR, Est AFR Am: >60 mL/min (ref >60)
GFR, Estimated: >60 mL/min (ref >60)
Glucose, Bld: 127 mg/dL — ABNORMAL HIGH (ref 70–99)
Potassium: 3.9 mmol/L (ref 3.5–5.1)
Sodium: 138 mmol/L (ref 135–145)
Total Bilirubin: 0.4 mg/dL (ref 0.3–1.2)
Total Protein: 7.4 g/dL (ref 6.5–8.1)

## 2020-07-24 LAB — CBC WITH DIFFERENTIAL (CANCER CENTER ONLY)
Abs Immature Granulocytes: 0.02 10*3/uL (ref 0.00–0.07)
Basophils Absolute: 0.1 10*3/uL (ref 0.0–0.1)
Basophils Relative: 1 %
Eosinophils Absolute: 0.4 10*3/uL (ref 0.0–0.5)
Eosinophils Relative: 4 %
HCT: 45.2 % (ref 36.0–46.0)
Hemoglobin: 15.2 g/dL — ABNORMAL HIGH (ref 12.0–15.0)
Immature Granulocytes: 0 %
Lymphocytes Relative: 23 %
Lymphs Abs: 2.2 10*3/uL (ref 0.7–4.0)
MCH: 30.3 pg (ref 26.0–34.0)
MCHC: 33.6 g/dL (ref 30.0–36.0)
MCV: 90.2 fL (ref 80.0–100.0)
Monocytes Absolute: 0.5 10*3/uL (ref 0.1–1.0)
Monocytes Relative: 5 %
Neutro Abs: 6.6 10*3/uL (ref 1.7–7.7)
Neutrophils Relative %: 67 %
Platelet Count: 251 10*3/uL (ref 150–400)
RBC: 5.01 MIL/uL (ref 3.87–5.11)
RDW: 11.9 % (ref 11.5–15.5)
WBC Count: 9.8 10*3/uL (ref 4.0–10.5)
nRBC: 0 % (ref 0.0–0.2)

## 2020-07-31 ENCOUNTER — Ambulatory Visit (INDEPENDENT_AMBULATORY_CARE_PROVIDER_SITE_OTHER): Payer: Medicaid Other | Admitting: Orthopaedic Surgery

## 2020-07-31 ENCOUNTER — Encounter: Payer: Self-pay | Admitting: Orthopaedic Surgery

## 2020-07-31 DIAGNOSIS — G8929 Other chronic pain: Secondary | ICD-10-CM | POA: Diagnosis not present

## 2020-07-31 DIAGNOSIS — M542 Cervicalgia: Secondary | ICD-10-CM | POA: Diagnosis not present

## 2020-07-31 NOTE — Progress Notes (Signed)
Office Visit Note   Patient: Megan Colon           Date of Birth: 04-15-1976           MRN: [id number] Visit Date: 07/31/2020              Requested by: Nicolette Bang, DO [place],  [place] [address] PCP: Nicolette Bang, DO   Assessment & Plan: Visit Diagnoses:  1. Neck pain, chronic     Plan: Discussed the patient I am not sure if the chronic neck pain is related to her migraines.  She does have some loss of normal cervical lordosis minimal degenerative changes.  We will set her up for some physical therapy and she can follow-up in 2 months.  She will continue neurology follow-up for her migraine problems.  No evidence of radiculopathy or myelopathy on exam.  Follow-Up Instructions: No follow-ups on file.   Orders:  No orders of the defined types were placed in this encounter.  No orders of the defined types were placed in this encounter.     Procedures: No procedures performed   Clinical Data: No additional findings.   Subjective: Chief Complaint  Patient presents with  . Neck - Pain    HPI 44 year old female with 42-year-old boys had chronic neck pain since she started having problems with migraines at age 78.  She has pain that radiates into her neck sometimes she has numbness in her hands it tends to wake her up at night on occasion.  She shakes her hands and goes back to sleep.  Pain radiates from her neck into her shoulders chest sometimes with turning and tilting makes her neck feel better.  She is not been through any therapy.  She was started on some Fioricet by DrRexene Alberts neurologist.  She states bothered her vision she had to stop the medicine.  She is referred here by her PCP after x-rays demonstrated some mild loss of normal cervical lordosis and mild degenerative changes.  She denies any myelopathic problems no significant rheumatologic conditions no gait problems.  No significant injuries.  Review of Systems patient did  have gestational hypertension and BP is elevated today.  Otherwise 14 point systems negative as it pertains HPI other than as mentioned above.   Objective: Vital Signs: BP (!) 148/109   Pulse (!) 109   Ht 5\' 1"  (1.549 m)   Wt 171 lb (77.6 kg)   BMI 32.31 kg/m   Physical Exam Constitutional:      Appearance: She is well-developed.  HENT:     Head: Normocephalic.     Right Ear: External ear normal.     Left Ear: External ear normal.  Eyes:     Pupils: Pupils are equal, round, and reactive to light.  Neck:     Thyroid: No thyromegaly.     Trachea: No tracheal deviation.  Cardiovascular:     Rate and Rhythm: Normal rate.  Pulmonary:     Effort: Pulmonary effort is normal.  Abdominal:     Palpations: Abdomen is soft.  Skin:    General: Skin is warm and dry.  Neurological:     Mental Status: She is alert and oriented to person, place, and time.  Psychiatric:        Behavior: Behavior normal.     Ortho Exam no brachial plexus tenderness she actually states pressure on the brachial plexus and trapezius makes it feel better negative Spurling upper extremity  reflexes are 2+ deltoid internal/external rotation biceps triceps wrist flexion extension per fundi sub my extensors EPL all are normal to testing.  No lower extremity hyperreflexia normal gait no clonus.  Specialty Comments:  No specialty comments available.  Imaging: CLINICAL DATA:  Chronic neck pain.  EXAM: CERVICAL SPINE - COMPLETE 4+ VIEW  COMPARISON:  No prior.  FINDINGS: Loss of normal cervical lordosis. Mild degenerative change C5-C6, C6-C7, and C7-T1 with mild disc degeneration and endplate osteophyte formation. No acute bony or joint abnormality. No evidence of fracture or dislocation.  IMPRESSION: Loss of normal cervical lordosis. Mild degenerative changes lower cervical spine. No acute abnormality identified.   Electronically Signed   By: Marcello Moores  Register   On: 07/12/2020 16:06    PMFS  History: Patient Active Problem List   Diagnosis Date Noted  . Neck pain, chronic 07/31/2020  . Degenerative disc disease, cervical 07/12/2020  . Gestational hypertension 05/06/2017   Past Medical History:  Diagnosis Date  . Arthritis   . GERD (gastroesophageal reflux disease)   . Lung cancer [facility])    Father  . Migraines    since childhood    Family History  Problem Relation Age of Onset  . Hypertension Mother   . Diabetes Father   . Cancer Neg Hx   . Stroke Neg Hx     Past Surgical History:  Procedure Laterality Date  . CESAREAN SECTION N/A 05/08/2017   Procedure: CESAREAN SECTION;  Surgeon: Florian Buff, MD;  Location: [place];  Service: Obstetrics;  Laterality: N/A;  . LASIK Bilateral 2006   Social History   Occupational History  . Not on file  Tobacco Use  . Smoking status: Current Some Day Smoker    Packs/day: 0.25    Types: Cigarettes  . Smokeless tobacco: Never Used  Substance and Sexual Activity  . Alcohol use: No  . Drug use: No  . Sexual activity: Yes    Birth control/protection: Condom

## 2020-07-31 NOTE — Addendum Note (Signed)
Addended by: Meyer Cory on: 07/31/2020 09:09 AM   Modules accepted: Orders

## 2020-08-08 ENCOUNTER — Encounter: Payer: Self-pay | Admitting: Internal Medicine

## 2020-08-08 ENCOUNTER — Ambulatory Visit (INDEPENDENT_AMBULATORY_CARE_PROVIDER_SITE_OTHER): Payer: Medicaid Other | Admitting: Internal Medicine

## 2020-08-08 DIAGNOSIS — G4700 Insomnia, unspecified: Secondary | ICD-10-CM | POA: Diagnosis not present

## 2020-08-08 DIAGNOSIS — E669 Obesity, unspecified: Secondary | ICD-10-CM | POA: Diagnosis not present

## 2020-08-08 DIAGNOSIS — M5030 Other cervical disc degeneration, unspecified cervical region: Secondary | ICD-10-CM | POA: Diagnosis not present

## 2020-08-08 MED ORDER — MELOXICAM 15 MG PO TABS: 15.0000 mg | ORAL_TABLET | Freq: Every day | ORAL | 0 refills | Status: DC

## 2020-08-08 NOTE — Progress Notes (Signed)
Virtual Visit via Telephone Note  I connected with Megan Colon, on 08/08/2020 at 1:36 PM by telephone due to the COVID-19 pandemic and verified that I am speaking with the correct person using two identifiers.   Consent: I discussed the limitations, risks, security and privacy concerns of performing an evaluation and management service by telephone and the availability of in person appointments. I also discussed with the patient that there may be a patient responsible charge related to this service. The patient expressed understanding and agreed to proceed.   Location of Patient: Home   Location of Provider: Clinic    Persons participating in Telemedicine visit: Teara Duerksen [facility] Dr. Juleen China      History of Present Illness: Patient has a visit to follow up on weight management. Has a initial visit with [facility] on 9/30 and then follow up on 10/14. Had to delay the referral due to cost.   Does treadmill for 15-20 minutes. Tries to do a rotation of body weight exercises with push ups, sit ups. Will also do yoga. Will also walk with her two year old child on the path at the park.   Trying to keep a healthy diet. Not eating past 6 pm. Thinks that nighttime snacking has been a culprit of weight gain in the past. However, frustrated because she feels like she still gains weight and is currently 174 lbs.    Past Medical History:  Diagnosis Date  . Arthritis   . GERD (gastroesophageal reflux disease)   . Lung cancer [facility])    Father  . Migraines    since childhood   Allergies  Allergen Reactions  . Zofran [Ondansetron Hcl] Nausea And Vomiting  . Oxycodone Hcl Itching    Current Outpatient Medications on File Prior to Visit  Medication Sig Dispense Refill  . butalbital-acetaminophen-caffeine (FIORICET) 50-325-40 MG tablet Take 1-2 tablets by mouth every 8 (eight) hours. 15 tablet 5  . fluticasone (FLONASE) 50 MCG/ACT nasal spray Place 2  sprays into both nostrils daily. 16 g 6  . pantoprazole (PROTONIX) 40 MG tablet Take 1 tablet (40 mg total) by mouth daily. 30 tablet 1  . polyethylene glycol powder (GLYCOLAX/MIRALAX) 17 GM/SCOOP powder Take 17 g by mouth daily. 3350 g 1  . traZODone (DESYREL) 50 MG tablet Take 0.5-1 tablets (25-50 mg total) by mouth at bedtime as needed for sleep. 30 tablet 3  . varenicline (CHANTIX) 0.5 MG tablet Take one tablet daily days 1-3. Then take two tablets daily days 4-7. After one week, increase to two tablets twice per day and continue for at least 11 weeks. 180 tablet 1  . SUMAtriptan (IMITREX) 100 MG tablet Take 1 tablet at the onset of migraine. May repeat in 2 hours if headache persists or recurs. (Patient not taking: Reported on 07/11/2020) 10 tablet 11   No current facility-administered medications on file prior to visit.    Observations/Objective: NAD. Speaking clearly.  Work of breathing normal.  Alert and oriented. Mood appropriate.   Assessment and Plan: 1. Class 1 obesity without serious comorbidity in adult, unspecified BMI, unspecified obesity type We discussed healthy exercise and diet techniques. Encouraged to continue lifestyle modifications. Follow up with [facility].   2. Insomnia, unspecified type Prescribed Trazodone at last visit. Patient reports able to get a full nights sleep with this medication.   3. Degenerative disc disease, cervical Will trial Mobic during daytime as is experiencing some increased pain now  that she is more active. PT referral has been placed and patient has upcoming first visit scheduled.  - meloxicam (MOBIC) 15 MG tablet; Take 1 tablet (15 mg total) by mouth daily.  Dispense: 30 tablet; Refill: 0   Follow Up Instructions: PRN and for routine medical care    I discussed the assessment and treatment plan with the patient. The patient was provided an opportunity to ask questions and all were answered. The patient  agreed with the plan and demonstrated an understanding of the instructions.   The patient was advised to call back or seek an in-person evaluation if the symptoms worsen or if the condition fails to improve as anticipated.     I provided 14 minutes total of non-face-to-face time during this encounter including median intraservice time, reviewing previous notes, investigations, ordering medications, medical decision making, coordinating care and patient verbalized understanding at the end of the visit.    Phill Myron, D.O. [facility]  08/08/2020, 1:36 PM

## 2020-08-14 ENCOUNTER — Other Ambulatory Visit: Payer: Self-pay | Admitting: Registered Nurse

## 2020-08-14 DIAGNOSIS — G43829 Menstrual migraine, not intractable, without status migrainosus: Secondary | ICD-10-CM

## 2020-08-14 NOTE — Telephone Encounter (Signed)
Rx refill request sent to wrong Pool.   Sent the request to the provider and her CMA for review and refill.

## 2020-08-22 ENCOUNTER — Other Ambulatory Visit: Payer: Self-pay

## 2020-08-22 DIAGNOSIS — K219 Gastro-esophageal reflux disease without esophagitis: Secondary | ICD-10-CM

## 2020-08-22 MED ORDER — PANTOPRAZOLE SODIUM 40 MG PO TBEC: 40.0000 mg | DELAYED_RELEASE_TABLET | Freq: Every day | ORAL | 1 refills | Status: DC

## 2020-08-27 ENCOUNTER — Other Ambulatory Visit: Payer: Self-pay

## 2020-08-27 ENCOUNTER — Encounter: Payer: Self-pay | Admitting: Physical Therapy

## 2020-08-27 ENCOUNTER — Ambulatory Visit: Payer: Medicaid Other | Attending: Orthopaedic Surgery | Admitting: Physical Therapy

## 2020-08-27 DIAGNOSIS — M542 Cervicalgia: Secondary | ICD-10-CM | POA: Insufficient documentation

## 2020-08-27 DIAGNOSIS — R293 Abnormal posture: Secondary | ICD-10-CM | POA: Insufficient documentation

## 2020-08-27 DIAGNOSIS — M62838 Other muscle spasm: Secondary | ICD-10-CM | POA: Insufficient documentation

## 2020-08-27 NOTE — Therapy (Signed)
[place], [place], [address] Phone: [phone]   Fax:  [phone]  Physical Therapy Evaluation  Patient Details  Name: Megan Colon MRN: [id number] Date of Birth: 21-Aug-1976 Referring Provider (PT): Marybelle Killings, MD   Encounter Date: 08/27/2020   PT End of Session - 08/27/20 1743    Visit Number 1    Number of Visits 4    Authorization Type MCD - healthy blue    PT Start Time 7989    PT Stop Time 2119    PT Time Calculation (min) 40 min    Activity Tolerance Patient tolerated treatment well    Behavior During Therapy [facility] for tasks assessed/performed          Check all possible CPT codes:      []  97110 (Therapeutic Exercise)  []  92507 (SLP Treatment)  []  97112 (Neuro Re-ed)   []  92526 (Swallowing Treatment)   []  97116 (Gait Training)   []  [id number] (Cognitive Training, 1st 15 minutes) []  97140 (Manual Therapy)   []  97130 (Cognitive Training, each add'l 15 minutes)  []  97530 (Therapeutic Activities)  []  Other, List CPT Code ____________    []  41740 (Self Care)       [x]  All codes above (97110 - 97535)  [x]  97012 (Mechanical Traction)  [x]  97014 (E-stim Unattended)  []  97032 (E-stim manual)  [x]  97033 (Ionto)  []  97035 (Ultrasound)  []  97016 (Vaso)  []  97760 (Orthotic Fit) []  [id number] (Prosthetic Training) []  L6539673 (Physical Performance Training) []  H7904499 (Aquatic Therapy) []  [id number] (Canalith Repositioning) []  W5747761 (Contrast Bath) []  L3129567 (Paraffin) []  97597 (Wound Care 1st 20 sq cm) []  97598 (Wound Care each add'l 20 sq cm)      Past Medical History:  Diagnosis Date  . Arthritis   . GERD (gastroesophageal reflux disease)   . Lung cancer [facility])    Father  . Migraines    since childhood    Past Surgical History:  Procedure Laterality Date  . CESAREAN SECTION N/A 05/08/2017   Procedure: CESAREAN SECTION;  Surgeon: Florian Buff, MD;  Location: [place];  Service: Obstetrics;   Laterality: N/A;  . LASIK Bilateral 2006    There were no vitals filed for this visit.    Subjective Assessment - 08/27/20 1747    Subjective Pt reports migraine/tension headaches from shoulder to neck. Pt states this has been ocurring a year or two but it's gotten worse in the past 6 months. Pt states she feels it the most at night. Pt notes that the pain is constant on the right side. At its worst rates it 10/10. Reports L >R arm numbness/tingling when sleeping.    Limitations House hold activities;Lifting    How long can you sit comfortably? n/a    How long can you stand comfortably? n/a    How long can you walk comfortably? n/a    Diagnostic tests XR: Loss of normal cervical lordosis. Mild degenerative changes lowercervical spine. No acute abnormality identified.    Patient Stated Goals Do daily activities without pain; be able to sleep well at night; driving    Currently in Pain? Yes    Pain Score 10-Worst pain ever    Pain Location Neck    Pain Orientation Right    Pain Descriptors / Indicators Aching    Pain Type Chronic pain    Pain Onset More than a month ago    Pain Frequency Constant    Aggravating Factors  Has not been able to pinpoint. Sometimes she wakes up and it's bad.    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      Observation/Other Assessments   Focus  on Therapeutic Outcomes (FOTO)  n/a      Posture/Postural Control   Posture/Postural Control Postural limitations    Postural Limitations Forward head;Rounded Shoulders      AROM   Cervical Flexion 100%   Feels mild pull   Cervical Extension 100%   "Feels good"   Cervical - Right Side Bend 90%   mild pain   Cervical - Left Side Bend 50%   "pinching" hurts more   Cervical - Right Rotation 100%    Cervical - Left Rotation 90%   Feels pain     PROM   Overall PROM Comments Grossly WFL      Strength   Overall Strength Comments Grossly 5/5 for bilat UEs    Strength Assessment Site Shoulder;Elbow;Wrist;Hand      Palpation   Palpation comment Trigger points along upper trap, scalenes, SCM, suboccipitals and cervical paraspinals      Special Tests    Special Tests Cervical      Spurling's   Findings Negative      Distraction Test   Findngs Negative    Comment Felt good                      Objective measurements completed on examination: See above findings.               PT Education - 08/27/20 1825    Education Details Discussed exam findings and POC, HEP, head/neck posture, and dry needling.    Person(s) Educated Patient    Methods Explanation;Demonstration;Verbal cues;Handout;Tactile cues    Comprehension Verbalized understanding;Returned demonstration;Verbal cues required;Tactile cues required            PT Short Term Goals - 08/27/20 1830      PT SHORT TERM GOAL #1   Title Pt will be independent with initial HEP    Baseline Newly provided    Time 3    Period Weeks    Status New    Target Date 09/17/20      PT SHORT TERM GOAL #2   Title Pt will have normal neck ROM with </=5/10 pain    Baseline Neck rotation 10% limited with 10/10 pain at times    Time 3    Period Weeks    Status New    Target Date 09/17/20      PT SHORT TERM GOAL #3   Title Pt will report pain to be </=5/10 while performing house work  Baseline 10/10 at worst      Time 3    Period Weeks    Status New    Target Date 09/17/20             PT Long Term Goals - 08/27/20 1920      PT LONG TERM GOAL #1   Title Pt will be independent with advanced HEP    Time 6    Period Weeks    Status New    Target Date 10/08/20      PT LONG TERM GOAL #2   Title Pt will be independent with maintaining good forward/head posture without cueing during home tasks    Baseline Requires cues    Time 6    Period Weeks    Status New    Target Date 10/08/20      PT LONG TERM GOAL #3   Title Pt will be able to peform full head turns for driving with no discomfort    Time 6    Period Weeks    Status New    Target Date 10/08/20                  Plan - 08/27/20 1826    Clinical Impression Statement Pt is a 44 y/o F presenting to OPPT with chronic R side neck pain with associated migraines. Pt with decreased cervical ROM and with tightened suboccipitals, cervical paraspinals, scalenes and upper trap. These deficits limits her ability to sleep and turn her head while driving. Pt would benefit from therapy to address these issues and optimize her level of function.    Personal Factors and Comorbidities Time since onset of injury/illness/exacerbation    Examination-Activity Limitations Caring for Others;Carry;Bathing    Examination-Participation Restrictions Driving;Community Activity    Stability/Clinical Decision Making Stable/Uncomplicated    Clinical Decision Making Low    Rehab Potential Good    PT Frequency 1x / week    PT Duration 6 weeks    PT Treatment/Interventions ADLs/Self Care Home Management;Biofeedback;Cryotherapy;Electrical Stimulation;Iontophoresis 4mg /ml Dexamethasone;Moist Heat;Traction;Ultrasound;Functional mobility training;Therapeutic activities;Therapeutic exercise;Neuromuscular re-education;Patient/family education;Manual techniques;Passive range of motion;Dry needling;Taping;Joint Manipulations;Spinal Manipulations    PT Next Visit  Plan Assess response to HEP. Consider dry needling (discussed with pt already). Continue manual therapy. Progress cervical strengthening.    PT Home Exercise Plan Access Code: [email or web address]    Consulted and Agree with Plan of Care Patient           Patient will benefit from skilled therapeutic intervention in order to improve the following deficits and impairments:  Decreased range of motion, Increased fascial restricitons, Increased muscle spasms, Pain, Improper body mechanics, Postural dysfunction, Decreased mobility, Hypomobility  Visit Diagnosis: Abnormal posture  Cervicalgia  Other muscle spasm     Problem List Patient Active Problem List   Diagnosis Date Noted  . Neck pain, chronic 07/31/2020  . Degenerative disc disease, cervical 07/12/2020  . Gestational hypertension 05/06/2017    Sullivan Jacuinde April Gordy Levan 08/27/2020, 7:28 PM  [facility] [address] [place], [place], [address] Phone: [phone]   Fax:  [phone]  Name: Gretel Cantu MRN: [id number] Date of Birth: [birth year]

## 2020-08-27 NOTE — Patient Instructions (Signed)
Access Code: [email or web address] URL: [email or web address] Date: 08/27/2020 Prepared by: Estill Bamberg April Thurnell Garbe  Exercises Supine Chin Tuck - 1 x daily - 7 x weekly - 3 sets - 5 reps - 5-10 sec hold Seated Cervical Retraction and Extension - 1 x daily - 7 x weekly - 3 sets - 5 reps - 5-10 sec hold Seated Cervical Sidebending Stretch - 1 x daily - 7 x weekly - 3 sets - 30 sec hold Seated Scalene Stretch with Towel - 1 x daily - 7 x weekly - 3 sets - 30 sec hold Seated Assisted Cervical Rotation with Towel - 1 x daily - 7 x weekly - 3 sets - 30 sec hold Seated Scapular Retraction - 1 x daily - 7 x weekly - 3 sets - 10 reps

## 2020-08-29 ENCOUNTER — Emergency Department (HOSPITAL_COMMUNITY): Payer: Medicaid Other

## 2020-08-29 ENCOUNTER — Encounter (HOSPITAL_COMMUNITY): Payer: Self-pay | Admitting: Emergency Medicine

## 2020-08-29 ENCOUNTER — Emergency Department (HOSPITAL_COMMUNITY)
Admission: EM | Admit: 2020-08-29 | Discharge: 2020-08-30 | Disposition: A | Discharge status: Home / Self Care | Payer: Medicaid Other | Attending: Emergency Medicine | Admitting: Emergency Medicine

## 2020-08-29 ENCOUNTER — Other Ambulatory Visit: Payer: Self-pay

## 2020-08-29 DIAGNOSIS — Z5321 Procedure and treatment not carried out due to patient leaving prior to being seen by health care provider: Secondary | ICD-10-CM | POA: Diagnosis not present

## 2020-08-29 DIAGNOSIS — R202 Paresthesia of skin: Secondary | ICD-10-CM | POA: Diagnosis not present

## 2020-08-29 DIAGNOSIS — R079 Chest pain, unspecified: Secondary | ICD-10-CM | POA: Diagnosis not present

## 2020-08-29 DIAGNOSIS — R0602 Shortness of breath: Secondary | ICD-10-CM | POA: Insufficient documentation

## 2020-08-29 DIAGNOSIS — R251 Tremor, unspecified: Secondary | ICD-10-CM | POA: Insufficient documentation

## 2020-08-29 LAB — BASIC METABOLIC PANEL
Anion gap: 10 (ref 5–15)
BUN: 11 mg/dL (ref 6–20)
CO2: 26 mmol/L (ref 22–32)
Calcium: 9.8 mg/dL (ref 8.9–10.3)
Chloride: 101 mmol/L (ref 98–111)
Creatinine, Ser: 0.74 mg/dL (ref 0.44–1.00)
GFR calc Af Amer: >60 mL/min (ref >60)
GFR calc non Af Amer: >60 mL/min (ref >60)
Glucose, Bld: 138 mg/dL — ABNORMAL HIGH (ref 70–99)
Potassium: 4.3 mmol/L (ref 3.5–5.1)
Sodium: 137 mmol/L (ref 135–145)

## 2020-08-29 LAB — CBC
HCT: 44.7 % (ref 36.0–46.0)
Hemoglobin: 14.6 g/dL (ref 12.0–15.0)
MCH: 29.3 pg (ref 26.0–34.0)
MCHC: 32.7 g/dL (ref 30.0–36.0)
MCV: 89.8 fL (ref 80.0–100.0)
Platelets: 259 10*3/uL (ref 150–400)
RBC: 4.98 MIL/uL (ref 3.87–5.11)
RDW: 12 % (ref 11.5–15.5)
WBC: 12.6 10*3/uL — ABNORMAL HIGH (ref 4.0–10.5)
nRBC: 0 % (ref 0.0–0.2)

## 2020-08-29 LAB — I-STAT BETA HCG BLOOD, ED (MC, WL, AP ONLY): I-stat hCG, quantitative: <5 m[IU]/mL (ref <5)

## 2020-08-29 LAB — TROPONIN I (HIGH SENSITIVITY): Troponin I (High Sensitivity): <2 ng/L (ref <18)

## 2020-08-29 NOTE — ED Triage Notes (Signed)
Pt c/o non-radiating chest pain, shortness of breath, numbness to bilateral hands and "shaking" since Monday.

## 2020-08-29 NOTE — ED Notes (Signed)
Pt stated she feels a little better and doesn't want her daughter having to wait to outside and she will check her MyChart for results. Pt LWBS

## 2020-08-30 LAB — TROPONIN I (HIGH SENSITIVITY): Troponin I (High Sensitivity): 5 ng/L (ref <18)

## 2020-09-02 ENCOUNTER — Telehealth: Payer: Self-pay | Admitting: *Deleted

## 2020-09-02 NOTE — Telephone Encounter (Addendum)
Megan Colon presented to the ED and left before being seen by the provider on 08/30/20. The patient has been enrolled in an automated general discharge outreach program and 2 attempts to contact the patient will be made to follow up on their ED visit and subsequent needs. The care management team is available to provide assistance to this patient at any time.   Lenor Coffin, RN, BSN, [facility] [place] [phone]

## 2020-09-05 ENCOUNTER — Ambulatory Visit: Payer: Medicaid Other

## 2020-09-11 ENCOUNTER — Telehealth: Payer: Self-pay | Admitting: Physical Therapy

## 2020-09-11 ENCOUNTER — Ambulatory Visit: Payer: Medicaid Other | Admitting: Physical Therapy

## 2020-09-11 NOTE — Telephone Encounter (Signed)
Called patient to notify her of her missed appt and remind her of the attendance policy.   Reminded her of her next appt.   Raeford Razor, PT 09/11/20 4:40 PM Phone: [phone] Fax: [phone]

## 2020-09-19 ENCOUNTER — Ambulatory Visit (INDEPENDENT_AMBULATORY_CARE_PROVIDER_SITE_OTHER): Payer: Medicaid Other | Admitting: Family Medicine

## 2020-09-19 ENCOUNTER — Other Ambulatory Visit: Payer: Self-pay

## 2020-09-19 ENCOUNTER — Ambulatory Visit: Payer: Medicaid Other

## 2020-09-19 DIAGNOSIS — M542 Cervicalgia: Secondary | ICD-10-CM

## 2020-09-19 DIAGNOSIS — R293 Abnormal posture: Secondary | ICD-10-CM

## 2020-09-19 DIAGNOSIS — M62838 Other muscle spasm: Secondary | ICD-10-CM

## 2020-09-19 NOTE — Therapy (Addendum)
[place], [place], [address] Phone: [phone]   Fax:  [phone]  Physical Therapy Treatment  Patient Details  Name: Barbarajean Kinzler MRN: [id number] Date of Birth: [birth year] Referring Provider (PT): Marybelle Killings, MD   Encounter Date: 09/19/2020   PT End of Session - 09/19/20 1516    Visit Number 1    Number of Visits 6    Date for PT Re-Evaluation 10/10/20    Authorization Type MCD - healthy blue    PT Start Time 1420    PT Stop Time 1435    PT Time Calculation (min) 15 min    Activity Tolerance Patient tolerated treatment well    Behavior During Therapy [facility] for tasks assessed/performed           Past Medical History:  Diagnosis Date  . Arthritis   . GERD (gastroesophageal reflux disease)   . Lung cancer [facility])    Father  . Migraines    since childhood    Past Surgical History:  Procedure Laterality Date  . CESAREAN SECTION N/A 05/08/2017   Procedure: CESAREAN SECTION;  Surgeon: Florian Buff, MD;  Location: [place];  Service: Obstetrics;  Laterality: N/A;  . LASIK Bilateral 2006    There were no vitals filed for this visit.   Subjective Assessment - 09/19/20 1428    Subjective Pt reports she went to ED for chest pain on 9/9 but left because they couldn't see her for 14 hours. She wants to focus on that and come back for PT at a later time. The exercises really help when she does them, but has not been consistent.    Limitations House hold activities;Lifting    How long can you sit comfortably? n/a    How long can you stand comfortably? n/a    How long can you walk comfortably? n/a    Diagnostic tests XR: Loss of normal cervical lordosis. Mild degenerative changes lowercervical spine. No acute abnormality identified.    Patient Stated Goals Do daily activities without pain; be able to sleep well at night; driving    Pain Onset More than a month ago                                        PT Short Term Goals - 08/27/20 1830      PT SHORT TERM GOAL #1   Title Pt will be independent with initial HEP    Baseline Newly provided    Time 3    Period Weeks    Status New    Target Date 09/17/20      PT SHORT TERM GOAL #2   Title Pt will have normal neck ROM with </=5/10 pain    Baseline Neck rotation 10% limited with 10/10 pain at times    Time 3    Period Weeks    Status New    Target Date 09/17/20      PT SHORT TERM GOAL #3   Title Pt will report pain to be </=5/10 while performing house work    Baseline 10/10 at worst    Time 3    Period Weeks    Status New    Target Date 09/17/20             PT Long Term Goals - 08/27/20 1920      PT  LONG TERM GOAL #1   Title Pt will be independent with advanced HEP    Time 6    Period Weeks    Status New    Target Date 10/08/20      PT LONG TERM GOAL #2   Title Pt will be independent with maintaining good forward/head posture without cueing during home tasks    Baseline Requires cues    Time 6    Period Weeks    Status New    Target Date 10/08/20      PT LONG TERM GOAL #3   Title Pt will be able to peform full head turns for driving with no discomfort    Time 6    Period Weeks    Status New    Target Date 10/08/20                  Patient will benefit from skilled therapeutic intervention in order to improve the following deficits and impairments:     Visit Diagnosis: Abnormal posture  Cervicalgia  Other muscle spasm     Problem List Patient Active Problem List   Diagnosis Date Noted  . Neck pain, chronic 07/31/2020  . Degenerative disc disease, cervical 07/12/2020  . Gestational hypertension 05/06/2017    Izell Ruch, PT, DPT 09/19/2020, 3:17 PM  [facility] [address] [place], [place], [address] Phone: [phone]   Fax:  [phone]  Name: Samai Corea MRN:  [id number] Date of Birth: [birth year]   PHYSICAL THERAPY DISCHARGE SUMMARY  Visits from Start of Care: 1  Current functional level related to goals / functional outcomes: unknown   Remaining deficits: unknown   Education / Equipment: HEP   Plan: Patient agrees to discharge.  Patient goals were not met. Patient is being discharged due to not returning since the last visit.  ?????        Phill Myron. Yvette Rack, PT, DPT

## 2020-09-26 ENCOUNTER — Ambulatory Visit: Payer: Medicaid Other | Admitting: Physical Therapy

## 2020-10-03 ENCOUNTER — Ambulatory Visit (INDEPENDENT_AMBULATORY_CARE_PROVIDER_SITE_OTHER): Payer: Medicaid Other | Admitting: Family Medicine

## 2020-10-21 ENCOUNTER — Encounter: Payer: Self-pay | Admitting: Adult Health

## 2020-10-21 ENCOUNTER — Ambulatory Visit: Payer: Medicaid Other | Admitting: Adult Health

## 2020-12-09 ENCOUNTER — Telehealth: Payer: Medicaid Other | Admitting: Family

## 2020-12-09 ENCOUNTER — Other Ambulatory Visit: Payer: Self-pay

## 2020-12-09 DIAGNOSIS — K5900 Constipation, unspecified: Secondary | ICD-10-CM | POA: Diagnosis not present

## 2020-12-09 DIAGNOSIS — J019 Acute sinusitis, unspecified: Secondary | ICD-10-CM | POA: Diagnosis not present

## 2020-12-09 MED ORDER — POLYETHYLENE GLYCOL 3350 17 GM/SCOOP PO POWD: 17.0000 g | Freq: Every day | ORAL | 1 refills | Status: DC

## 2020-12-09 MED ORDER — AMOXICILLIN-POT CLAVULANATE 875-125 MG PO TABS: 1.0000 | ORAL_TABLET | Freq: Two times a day (BID) | ORAL | 0 refills | Status: AC

## 2020-12-09 MED ORDER — PSEUDOEPH-BROMPHEN-DM 30-2-10 MG/5ML PO SYRP: 5.0000 mL | ORAL_SOLUTION | Freq: Four times a day (QID) | ORAL | 0 refills | Status: DC | PRN

## 2020-12-09 NOTE — Progress Notes (Signed)
Virtual Visit via Telephone Note  I connected with Megan Colon, on 12/09/2020 at 2:40 PM by telephone due to the COVID-19 pandemic and verified that I am speaking with the correct person using two identifiers.  Due to current restrictions/limitations of in-office visits due to the COVID-19 pandemic, this scheduled clinical appointment was converted to a telehealth visit.   Consent: I discussed the limitations, risks, security and privacy concerns of performing an evaluation and management service by telephone and the availability of in person appointments. I also discussed with the patient that there may be a patient responsible charge related to this service. The patient expressed understanding and agreed to proceed.   Location of Patient: Home  Location of Provider: [facility] [place] participating in Telemedicine visit: Karel Jarvis, NP Elmon Else, CMA  History of Present Illness: Megan Colon is a 44 year old female with history of gestational hypertension, degenerative disc disease cervical, and chronic neck pain who presents for sinus concerns.   1. SINUS: Onset: over 2 weeks and has not tried anything over-the-counter for this yet. Location:  Description: a lot of mucous from nose, coughing up some mucous sometimes, sneezing, headache  Symptoms Cough: Yes Discharge: Yes Fever: doesn't check temperature but feels warm sometimes with headache  Sinus Pressure:  Yes, especially eyes, nose, forehead Ears Blocked: Yes Teeth Ache:  Sometimes but has history of root canal and needs current repair Frontal Headache: Side of forehead, back of neck, both shoulders Shortness of breath: Denies Smoking: Yes Chest pain/chest pressure: Denies Change in mental state: Denies  2. CONSTIPATION: Denies blood in stool. Feeling bloated. No over-the-counter medications. Requesting medication.   Past Medical History:  Diagnosis Date  .  Allergy    Phreesia 12/09/2020  . Anxiety    Phreesia 12/09/2020  . Arthritis   . GERD (gastroesophageal reflux disease)   . Lung cancer [facility])    Father  . Migraines    since childhood   Allergies  Allergen Reactions  . Latex Itching  . Zofran [Ondansetron Hcl] Nausea And Vomiting  . Oxycodone Hcl Itching    Current Outpatient Medications on File Prior to Visit  Medication Sig Dispense Refill  . butalbital-acetaminophen-caffeine (FIORICET) 50-325-40 MG tablet Take 1-2 tablets by mouth every 8 (eight) hours. 15 tablet 5  . fluticasone (FLONASE) 50 MCG/ACT nasal spray Place 2 sprays into both nostrils daily. 16 g 6  . pantoprazole (PROTONIX) 40 MG tablet Take 1 tablet (40 mg total) by mouth daily. 90 tablet 1  . SUMAtriptan (IMITREX) 100 MG tablet Take 1 tablet at the onset of migraine. May repeat in 2 hours if headache persists or recurs. 10 tablet 11  . traZODone (DESYREL) 50 MG tablet Take 0.5-1 tablets (25-50 mg total) by mouth at bedtime as needed for sleep. 30 tablet 3  . meloxicam (MOBIC) 15 MG tablet Take 1 tablet (15 mg total) by mouth daily. (Patient not taking: Reported on 12/09/2020) 30 tablet 0  . polyethylene glycol powder (GLYCOLAX/MIRALAX) 17 GM/SCOOP powder Take 17 g by mouth daily. (Patient not taking: Reported on 12/09/2020) 3350 g 1  . propranolol (INDERAL) 40 MG tablet TAKE 1 TABLET(40 MG) BY MOUTH DAILY (Patient not taking: No sig reported) 90 tablet 0  . SUMAtriptan-naproxen (TREXIMET) 85-500 MG tablet TAKE 1 TABLET BY MOUTH TWICE DAILY FOR 15 DAYS. TAKE SECOND DOSE NO SOONER THAN 2 HOURS AFTER FIRST DOSE MAX 2 DOSES PER DAY (Patient not taking: Reported on 12/09/2020)  30 tablet 0  . varenicline (CHANTIX) 0.5 MG tablet Take one tablet daily days 1-3. Then take two tablets daily days 4-7. After one week, increase to two tablets twice per day and continue for at least 11 weeks. (Patient not taking: Reported on 12/09/2020) 180 tablet 1   No current  facility-administered medications on file prior to visit.    Observations/Objective: Alert and oriented x 3. Not in acute distress. Physical examination not completed as this is a telemedicine visit.  Assessment and Plan: 1. Acute non-recurrent sinusitis, unspecified location: - Patient with sinus symptoms greater than 2 weeks. May be related to sinus infection.  - Short course of Amoxicillin-Clavulanate for sinus infection.  - Brompheniramine-pseudoephedrine for cough.  - Follow-up with provider if symptoms do not improve and/or worsen.  - Patient was given clear instructions to go to Emergency Department or return to medical center if symptoms become severe or new problems develop.The patient verbalized understanding. - brompheniramine-pseudoephedrine-DM 30-2-10 MG/5ML syrup; Take 5 mLs by mouth 4 (four) times daily as needed.  Dispense: 120 mL; Refill: 0 - amoxicillin-clavulanate (AUGMENTIN) 875-125 MG tablet; Take 1 tablet by mouth 2 (two) times daily for 5 days.  Dispense: 10 tablet; Refill: 0  2. Constipation, unspecified constipation type: - Will try course of polyethylene glycol powder for constipation.  - Encouraged fresh fruit and vegetables, fiber intake, and hydration with water. - Follow-up with provider as needed. - polyethylene glycol powder (GLYCOLAX/MIRALAX) 17 GM/SCOOP powder; Take 17 g by mouth daily.  Dispense: 3350 g; Refill: 1  Follow Up Instructions: Follow-up with primary provider as needed.    Patient was given clear instructions to go to Emergency Department or return to medical center if symptoms don't improve, worsen, or new problems develop.The patient verbalized understanding.  I discussed the assessment and treatment plan with the patient. The patient was provided an opportunity to ask questions and all were answered. The patient agreed with the plan and demonstrated an understanding of the instructions.   The patient was advised to call back or seek an  in-person evaluation if the symptoms worsen or if the condition fails to improve as anticipated.  I provided 14 minutes total of non-face-to-face time during this encounter including median intraservice time, reviewing previous notes, labs, imaging, medications, management and patient verbalized understanding.   Corina Stacy Zachery Dauer, NP  [facility] [place], [place] 12/09/2020, 2:40 PM

## 2020-12-09 NOTE — Patient Instructions (Signed)

## 2020-12-09 NOTE — Progress Notes (Signed)
Causing headache, nausea, bloated, constipated Needs refill on pantoprazole  Request muscle relaxant for shoulder/neck pain something she can take at night

## 2021-01-02 DIAGNOSIS — Z20828 Contact with and (suspected) exposure to other viral communicable diseases: Secondary | ICD-10-CM | POA: Diagnosis not present

## 2021-03-31 NOTE — Progress Notes (Signed)
Patient ID: Megan Colon, female    DOB: 03/03/1976  MRN: [id number]  CC: Allergies   Subjective: Megan Colon is a 45 y.o. female who presents for allergies. Her concerns today include:   1. ALLERGIES: 12/10/2021: - Patient with sinus symptoms greater than 2 weeks. May be related to sinus infection.  - Short course of Amoxicillin-Clavulanate for sinus infection.  - Brompheniramine-pseudoephedrine for cough.  - Follow-up with provider if symptoms do not improve and/or worsen.   04/01/2021: Continue sneezing, nose hurting with swollen nasal passages, coughing, clear runny mucous. Zyrtec does not provide relief.  2. ABDOMINAL DISCOMFORT WITH URINARY SYMPTOMS: Intermittent bilateral lower abdominal tenderness with frequent urination. Endorses began 3 years ago after giving birth to her son. Has vaginal odor and clear-white discharge. Does feel nauseated sometimes. Denies abnormal uterine bleeding.   3. GERD FOLLOW-UP: Requesting refills of Pantoprazole.  4. INSOMNIA FOLLOW-UP: Requesting refills of Trazodone.  Patient Active Problem List   Diagnosis Date Noted  . Neck pain, chronic 07/31/2020  . Degenerative disc disease, cervical 07/12/2020  . Gestational hypertension 05/06/2017     Current Outpatient Medications on File Prior to Visit  Medication Sig Dispense Refill  . butalbital-acetaminophen-caffeine (FIORICET) 50-325-40 MG tablet Take 1-2 tablets by mouth every 8 (eight) hours. 15 tablet 5  . SUMAtriptan (IMITREX) 100 MG tablet Take 1 tablet at the onset of migraine. May repeat in 2 hours if headache persists or recurs. 10 tablet 11  . fluticasone (FLONASE) 50 MCG/ACT nasal spray Place 2 sprays into both nostrils daily. (Patient not taking: Reported on 04/01/2021) 16 g 6   No current facility-administered medications on file prior to visit.    Allergies  Allergen Reactions  . Latex Itching    Social History   Socioeconomic History  . Marital status: Single     Spouse name: Not on file  . Number of children: Not on file  . Years of education: Not on file  . Highest education level: Not on file  Occupational History  . Not on file  Tobacco Use  . Smoking status: Current Every Day Smoker    Packs/day: 0.25    Types: Cigarettes  . Smokeless tobacco: Never Used  Vaping Use  . Vaping Use: Never used  Substance and Sexual Activity  . Alcohol use: No  . Drug use: No  . Sexual activity: Yes    Birth control/protection: Condom  Other Topics Concern  . Not on file  Social History Narrative  . Not on file   Social Determinants of Health   Financial Resource Strain: Not on file  Food Insecurity: Not on file  Transportation Needs: Not on file  Physical Activity: Not on file  Stress: Not on file  Social Connections: Not on file  Intimate Partner Violence: Not on file    Family History  Problem Relation Age of Onset  . Hypertension Mother   . Diabetes Father   . Cancer Neg Hx   . Stroke Neg Hx     Past Surgical History:  Procedure Laterality Date  . CESAREAN SECTION N/A 05/08/2017   Procedure: CESAREAN SECTION;  Surgeon: Florian Buff, MD;  Location: [facility];  Service: Obstetrics;  Laterality: N/A;  . CESAREAN SECTION N/A    Phreesia 12/09/2020  . LASIK Bilateral 2006    ROS: Review of Systems Negative except as stated above  PHYSICAL EXAM: BP 133/88 (BP Location: Left Arm, Patient Position: Sitting)   Pulse 87   Ht 5'  2.01" (1.575 m)   Wt 177 lb (80.3 kg)   SpO2 96%   BMI 32.37 kg/m   Physical Exam HENT:     Head: Normocephalic and atraumatic.     Right Ear: Tympanic membrane, ear canal and external ear normal.     Left Ear: Tympanic membrane, ear canal and external ear normal.  Eyes:     Extraocular Movements: Extraocular movements intact.     Conjunctiva/sclera: Conjunctivae normal.     Pupils: Pupils are equal, round, and reactive to light.  Cardiovascular:     Rate and Rhythm: Normal rate and  regular rhythm.     Pulses: Normal pulses.     Heart sounds: Normal heart sounds.  Pulmonary:     Effort: Pulmonary effort is normal.     Breath sounds: Normal breath sounds.  Abdominal:     General: Bowel sounds are normal.     Palpations: Abdomen is soft.  Musculoskeletal:     Cervical back: Normal range of motion and neck supple.  Neurological:     General: No focal deficit present.     Mental Status: She is alert and oriented to person, place, and time.  Psychiatric:        Mood and Affect: Mood normal.        Behavior: Behavior normal.     ASSESSMENT AND PLAN: 1. Perennial allergic rhinitis: - Chronic.  - Referral to Allergy for further evaluation and management. - Ambulatory referral to Allergy  2. Lower abdominal pain: 3. Frequent urination: - Today urinalysis negative for nitrites and leukocytes.  - Referral to Urogynecology for further evaluation and management.  - POCT URINALYSIS DIP (CLINITEK) - Ambulatory referral to Urogynecology  4. Gastroesophageal reflux disease, unspecified whether esophagitis present: - Continue Pantoprazole as prescribed.  - Follow-up with primary provider as scheduled. - pantoprazole (PROTONIX) 40 MG tablet; Take 1 tablet (40 mg total) by mouth daily.  Dispense: 90 tablet; Refill: 0  5. Insomnia, unspecified type: - Continue Trazodone as prescribed.  - Follow-up with primary provider as scheduled.  - traZODone (DESYREL) 50 MG tablet; Take 0.5-1 tablets (25-50 mg total) by mouth at bedtime as needed for sleep.  Dispense: 30 tablet; Refill: 2   Patient was given the opportunity to ask questions.  Patient verbalized understanding of the plan and was able to repeat key elements of the plan. Patient was given clear instructions to go to Emergency Department or return to medical center if symptoms don't improve, worsen, or new problems develop.The patient verbalized understanding.   Orders Placed This Encounter  Procedures  . Ambulatory  referral to Allergy  . Ambulatory referral to Urogynecology  . POCT URINALYSIS DIP (CLINITEK)     Requested Prescriptions   Signed Prescriptions Disp Refills  . pantoprazole (PROTONIX) 40 MG tablet 90 tablet 0    Sig: Take 1 tablet (40 mg total) by mouth daily.  . traZODone (DESYREL) 50 MG tablet 30 tablet 2    Sig: Take 0.5-1 tablets (25-50 mg total) by mouth at bedtime as needed for sleep.    Follow-up with primary provider as scheduled.   Camillia Herter, NP

## 2021-04-01 ENCOUNTER — Encounter: Payer: Self-pay | Admitting: Family

## 2021-04-01 ENCOUNTER — Other Ambulatory Visit: Payer: Self-pay

## 2021-04-01 ENCOUNTER — Ambulatory Visit (INDEPENDENT_AMBULATORY_CARE_PROVIDER_SITE_OTHER): Payer: Medicaid Other | Admitting: Family

## 2021-04-01 VITALS — BP 133/88 | HR 87 | Ht 62.01 in | Wt 177.0 lb

## 2021-04-01 DIAGNOSIS — R350 Frequency of micturition: Secondary | ICD-10-CM

## 2021-04-01 DIAGNOSIS — G4700 Insomnia, unspecified: Secondary | ICD-10-CM | POA: Diagnosis not present

## 2021-04-01 DIAGNOSIS — K219 Gastro-esophageal reflux disease without esophagitis: Secondary | ICD-10-CM | POA: Diagnosis not present

## 2021-04-01 DIAGNOSIS — R1030 Lower abdominal pain, unspecified: Secondary | ICD-10-CM

## 2021-04-01 DIAGNOSIS — J3089 Other allergic rhinitis: Secondary | ICD-10-CM

## 2021-04-01 LAB — POCT URINALYSIS DIP (CLINITEK)
Bilirubin, UA: NEGATIVE
Glucose, UA: NEGATIVE mg/dL
Ketones, POC UA: NEGATIVE mg/dL
Leukocytes, UA: NEGATIVE
Nitrite, UA: NEGATIVE
POC PROTEIN,UA: NEGATIVE
Spec Grav, UA: >=1.03 — AB (ref 1.010–1.025)
Urobilinogen, UA: 0.2 E.U./dL
pH, UA: 5 (ref 5.0–8.0)

## 2021-04-01 MED ORDER — PANTOPRAZOLE SODIUM 40 MG PO TBEC: 40.0000 mg | DELAYED_RELEASE_TABLET | Freq: Every day | ORAL | 0 refills | Status: DC

## 2021-04-01 MED ORDER — TRAZODONE HCL 50 MG PO TABS: 25.0000 mg | ORAL_TABLET | Freq: Every evening | ORAL | 2 refills | Status: DC | PRN

## 2021-04-01 NOTE — Patient Instructions (Addendum)
Referral to ENT.   Referral to Urogynecology.   Continue Pantoprazole.   Continue Trazodone.  Follow-up with primary provider as scheduled.  Allergic Rhinitis, Adult Allergic rhinitis is a reaction to allergens. Allergens are things that can cause an allergic reaction. This condition affects the lining inside the nose (mucous membrane). There are two types of allergic rhinitis:  Seasonal. This type is also called hay fever. It happens only during some times of the year.  Perennial. This type can happen at any time of the year. This condition cannot be spread from person to person (is not contagious). It can be mild, worse, or very bad. It can develop at any age and may be outgrown. What are the causes? This condition may be caused by:  Pollen from grasses, trees, and weeds.  Dust mites.  Smoke.  Mold.  Car fumes.  The pee (urine), spit, or dander of pets. Dander is dead skin cells from a pet.   What increases the risk? You are more likely to develop this condition if:  You have allergies in your family.  You have problems like allergies in your family. You may have: ? Swelling of parts of your eyes and eyelids. ? Asthma. This affects how you breathe. ? Long-term redness and swelling on your skin. ? Food allergies. What are the signs or symptoms? The main symptom of this condition is a runny or stuffy nose (nasal congestion). Other symptoms may include:  Sneezing or coughing.  Itching and tearing of your eyes.  Mucus that drips down the back of your throat (postnasal drip).  Trouble sleeping.  Feeling tired.  Headache.  Sore throat. How is this treated? There is no cure for this condition. You should avoid things that you are allergic to. Treatment can help to relieve symptoms. This may include:  Medicines that block allergy symptoms, such as corticosteroids or antihistamines. These may be given as a shot, nasal spray, or pill.  Avoiding things you are  allergic to.  Medicines that give you bits of what you are allergic to over time. This is called immunotherapy. It is done if other treatments do not help. You may get: ? Shots. ? Medicine under your tongue.  Stronger medicines, if other treatments do not help. Follow these instructions at home: Avoiding allergens Find out what things you are allergic to and avoid them. To do this, try these things:  If you get allergies any time of year: ? Replace carpet with wood, tile, or vinyl flooring. Carpet can trap pet dander and dust. ? Do not smoke. Do not allow smoking in your home. ? Change your heating and air conditioning filters at least once a month.  If you get allergies only some times of the year: ? Keep windows closed when you can. ? Plan things to do outside when pollen counts are lowest. Check pollen counts before you plan things to do outside. ? When you come indoors, change your clothes and shower before you sit on furniture or bedding.   If you are allergic to a pet: ? Keep the pet out of your bedroom. ? Vacuum, sweep, and dust often.   General instructions  Take over-the-counter and prescription medicines only as told by your doctor.  Drink enough fluid to keep your pee (urine) pale yellow.  Keep all follow-up visits as told by your doctor. This is important. Where to find more information  American Academy of Allergy, Asthma & Immunology: www.aaaai.org Contact a doctor if:  You have a fever.  You get a cough that does not go away.  You make whistling sounds when you breathe (wheeze).  Your symptoms slow you down.  Your symptoms stop you from doing your normal things each day. Get help right away if:  You are short of breath. This symptom may be an emergency. Do not wait to see if the symptom will go away. Get medical help right away. Call your local emergency services (911 in the [place] Do not drive yourself to the hospital. Summary  Allergic rhinitis may  be treated by taking medicines and avoiding things you are allergic to.  If you have allergies only some of the year, keep windows closed when you can at those times.  Contact your doctor if you get a fever or a cough that does not go away. This information is not intended to replace advice given to you by your health care provider. Make sure you discuss any questions you have with your health care provider. Document Revised: 01/29/2020 Document Reviewed: 12/05/2019 Elsevier Patient Education  2021 [employer].

## 2021-04-01 NOTE — Progress Notes (Signed)
Allergic rhinitis  Clear runny mucous going on since Jan 2022 Abdominal pain w/frequent urination  Needs refill on pantoprazole

## 2021-04-30 NOTE — Progress Notes (Signed)
New Patient Note  RE: Megan Colon MRN: [id number] DOB: [birth year] Date of Office Visit: 05/01/2021  Consult requested by: Camillia Herter, NP Primary care provider: Camillia Herter, NP  Chief Complaint: Allergic Rhinitis  (Sneezing to point of peeing believes the migraines she has is due to allergies)  History of Present Illness: I had the pleasure of seeing Megan Colon for initial evaluation at the [facility] on 05/01/2021. She is a 45 y.o. female, who is referred here by Camillia Herter, NP for the evaluation of allergic rhinitis.  She reports symptoms of sneezing, rhinorrhea, PND, itchy nose, itchy/ watery eyes, coughing. Symptoms have been going on for a few years but worsening lately. The symptoms are present all year around. Other triggers include exposure to unknown. 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 Headache: has migraines. She has used zyrtec, Claritin, Flonase, with minimal improvement in symptoms. Sinus infections: not. Previous work up includes: none. Previous ENT evaluation: no. Previous sinus imaging: no. History of nasal polyps: no. Last eye exam: 3 years ago. History of reflux: takes Protonix 40mg  daily.  Assessment and Plan: Megan Colon is a 45 y.o. female with: Other allergic rhinitis Perennial rhinoconjunctivitis symptoms which has been worsening lately.  Tried Zyrtec, Claritin, Flonase with minimal benefit.  No prior allergy/ENT evaluation.  Today's skin testing showed: Positive to grass, weed pollen, ragweed, trees, dust mites, cat, dog, mold. Borderline to mouse and tobacco.  Start environmental control measures as below.  Take Xyzal (levocetirizine) 5mg  daily at night. May take twice a day during flares.  Start Singulair (montelukast) 10mg  daily at night. Cautioned that in some children/adults can experience behavioral changes including hyperactivity, agitation, depression, sleep disturbances and suicidal ideations. These side  effects are rare, but if you notice them you should notify me and discontinue Singulair (montelukast).  May use azelastine nasal spray 1-2 sprays per nostril twice a day as needed for runny nose/drainage.  May use Flonase (fluticasone) nasal spray 1 spray per nostril twice a day as needed for nasal congestion.   Nasal saline spray (i.e., Simply Saline) or nasal saline lavage (i.e., NeilMed) is recommended as needed and prior to medicated nasal sprays.  May use cromolyn 4% 1 drop in each eye up to four times a day as needed for itchy/watery eyes.   Consider allergy injections for long term control if above medications do not help the symptoms - handout given.   Recommend getting eyes check once per year.   Allergic conjunctivitis of both eyes  See assessment and plan as above for allergic rhinitis.  Coughing Coughing at times. No prior asthma diagnosis or inhaler use. Current tobacco user.  Today's spirometry showed some mild restriction with 2% improvement in FEV1 post bronchodilator treatment.  Clinically feeling unchanged. . The most common causes of chronic cough include the following: upper airway cough syndrome (UACS) which is caused by variety of rhinitis conditions; asthma; gastroesophageal reflux disease (GERD); chronic bronchitis from cigarette smoking or other inhaled environmental irritants; non-asthmatic eosinophilic bronchitis; and bronchiectasis.  . In prospective studies, these conditions have accounted for up to 94% of the causes of chronic cough in immunocompetent adults.  . The history and physical examination suggest that her cough is multifactorial with contribution from PND, GERD and cigarette exposure.   Tobacco user  Discussed smoking cessation.  Heartburn  Continue Protonix 40mg  daily as prescribed.  Return in about 2 months (around 07/01/2021).  Meds ordered this encounter  Medications  .  levocetirizine (XYZAL) 5 MG tablet    Sig: Take 1 tablet (5 mg  total) by mouth every evening.    Dispense:  30 tablet    Refill:  5  . fluticasone (FLONASE) 50 MCG/ACT nasal spray    Sig: Place 1 spray into both nostrils 2 (two) times daily as needed (nasal congestion).    Dispense:  16 g    Refill:  5  . azelastine (ASTELIN) 0.1 % nasal spray    Sig: Place 1-2 sprays into both nostrils 2 (two) times daily as needed (runny nose). Use in each nostril as directed    Dispense:  30 mL    Refill:  5  . cromolyn (OPTICROM) 4 % ophthalmic solution    Sig: Place 1 drop into both eyes 4 (four) times daily as needed (itchy/watery eyes).    Dispense:  10 mL    Refill:  5  . montelukast (SINGULAIR) 10 MG tablet    Sig: Take 1 tablet (10 mg total) by mouth at bedtime.    Dispense:  30 tablet    Refill:  5   Lab Orders  No laboratory test(s) ordered today    Other allergy screening: Asthma: no Food allergy: no Medication allergy: yes  Latex causes skin irritation Hymenoptera allergy: no Urticaria: yes once 2 years ago with no known triggers. Eczema:no History of recurrent infections suggestive of immunodeficency: no  Diagnostics: Spirometry:  Tracings reviewed. Her effort: Good reproducible efforts. FVC: 2.22L FEV1: 1.87L, 77% predicted FEV1/FVC ratio: 84% Interpretation: Spirometry consistent with possible restrictive disease with 2% improvement in FEV1 post bronchodilator treatment. Clinically feeling unchanged.   Please see scanned spirometry results for details.  Skin Testing: Environmental allergy panel. Positive to grass, weed pollen, ragweed, trees, dust mites, cat, dog, mold. Borderline to mouse and tobacco. Results discussed with patient/family.  Airborne Adult Perc - 05/01/21 0949    Time Antigen Placed 0950    Allergen Manufacturer Lavella Hammock    Location Back    Number of Test 59    1. Control-Buffer 50% Glycerol Negative    2. Control-Histamine 1 mg/ml 2+    3. Albumin saline Negative    4. [place] 2+    5. [place] Negative    6.  [place] 2+    7. [place] 4+    8. Meadow Fescue 3+    9. Perennial Rye 4+    10. Sweet Vernal 4+    11. Timothy 4+    12. Cocklebur Negative    13. Burweed Marshelder Negative    14. Ragweed, short 2+    15. Ragweed, Giant 2+    16. Plantain,  English 2+    17. Lamb's Quarters Negative    18. Sheep Sorrell Negative    19. Rough Pigweed Negative    20. Marsh Elder, Rough Negative    21. Mugwort, Common Negative    22. Ash mix 2+    23. Wendee Copp mix --   +/-   24. Beech American --   +/   25. Box, Elder 2+    26. Cedar, red Negative    27. Cottonwood, [place] Negative    28. Elm mix Negative    29. Hickory Negative    30. Maple mix Negative    31. Oak, [place] mix --   +/-   32. Pecan Pollen --   +/-   33. Pine mix Negative    34. Sycamore [place] --   +/-   35. [place],  Black Pollen 2+    36. Alternaria alternata Negative    37. Cladosporium Herbarum Negative    38. Aspergillus mix Negative    39. Penicillium mix Negative    40. Bipolaris sorokiniana (Helminthosporium) Negative    41. Drechslera spicifera (Curvularia) Negative    42. Mucor plumbeus Negative    43. Fusarium moniliforme Negative    44. Aureobasidium pullulans (pullulara) Negative    45. Rhizopus oryzae Negative    46. Botrytis cinera Negative    47. Epicoccum nigrum --   +/-   48. Phoma betae Negative    49. Candida Albicans Negative    50. Trichophyton mentagrophytes Negative    51. Mite, D Farinae  5,000 AU/ml 4+    52. Mite, D Pteronyssinus  5,000 AU/ml 4+    53. Cat Hair 10,000 BAU/ml 2+    54.  Dog Epithelia 2+    55. Mixed Feathers Negative    56. Horse Epithelia Negative    57. Cockroach, German Negative    58. Mouse --   +/-   59. Tobacco Leaf --   +/-         Intradermal - 05/01/21 1027    Time Antigen Placed 1020    Allergen Manufacturer Lavella Hammock    Location Back    Number of Test 7    Control Negative    [place] 3+    Mold 1 2+    Mold 2 2+    Mold 3 Negative    Mold 4 2+     Cockroach Negative           Past Medical History: Patient Active Problem List   Diagnosis Date Noted  . Other allergic rhinitis 05/01/2021  . Allergic conjunctivitis of both eyes 05/01/2021  . Tobacco user 05/01/2021  . Coughing 05/01/2021  . Heartburn 05/01/2021  . Neck pain, chronic 07/31/2020  . Degenerative disc disease, cervical 07/12/2020  . Gestational hypertension 05/06/2017   Past Medical History:  Diagnosis Date  . Allergy    [facility]reesia 12/09/2020  . Anxiety    [facility]reesia 12/09/2020  . Arthritis   . GERD (gastroesophageal reflux disease)   . Lung cancer [facility])    Father  . Migraines    since childhood   Past Surgical History: Past Surgical History:  Procedure Laterality Date  . CESAREAN SECTION N/A 05/08/2017   Procedure: CESAREAN SECTION;  Surgeon: Florian Buff, MD;  Location: [facility];  Service: Obstetrics;  Laterality: N/A;  . CESAREAN SECTION N/A    [employer]reesia 12/09/2020  . LASIK Bilateral 2006   Medication List:  Current Outpatient Medications  Medication Sig Dispense Refill  . azelastine (ASTELIN) 0.1 % nasal spray Place 1-2 sprays into both nostrils 2 (two) times daily as needed (runny nose). Use in each nostril as directed 30 mL 5  . cromolyn (OPTICROM) 4 % ophthalmic solution Place 1 drop into both eyes 4 (four) times daily as needed (itchy/watery eyes). 10 mL 5  . fluticasone (FLONASE) 50 MCG/ACT nasal spray Place 1 spray into both nostrils 2 (two) times daily as needed (nasal congestion). 16 g 5  . levocetirizine (XYZAL) 5 MG tablet Take 1 tablet (5 mg total) by mouth every evening. 30 tablet 5  . montelukast (SINGULAIR) 10 MG tablet Take 1 tablet (10 mg total) by mouth at bedtime. 30 tablet 5  . pantoprazole (PROTONIX) 40 MG tablet Take 1 tablet (40 mg total) by mouth daily. 90 tablet 0  . SUMAtriptan (IMITREX) 100  MG tablet Take 1 tablet at the onset of migraine. May repeat in 2 hours if headache persists or recurs. 10 tablet 11  .  traZODone (DESYREL) 50 MG tablet Take 0.5-1 tablets (25-50 mg total) by mouth at bedtime as needed for sleep. 30 tablet 2   No current facility-administered medications for this visit.   Allergies: Allergies  Allergen Reactions  . Latex Itching   Social History: Social History   Socioeconomic History  . Marital status: Single    Spouse name: Not on file  . Number of children: Not on file  . Years of education: Not on file  . Highest education level: Not on file  Occupational History  . Not on file  Tobacco Use  . Smoking status: Current Every Day Smoker    Packs/day: 0.25    Types: Cigarettes  . Smokeless tobacco: Never Used  Vaping Use  . Vaping Use: Never used  Substance and Sexual Activity  . Alcohol use: No  . Drug use: No  . Sexual activity: Yes    Birth control/protection: Condom  Other Topics Concern  . Not on file  Social History Narrative  . Not on file   Social Determinants of Health   Financial Resource Strain: Not on file  Food Insecurity: Not on file  Transportation Needs: Not on file  Physical Activity: Not on file  Stress: Not on file  Social Connections: Not on file   Lives in a 45 year old house. Smoking:1 pack every 4 days since 1996 Occupation: not employed  Environmental History: Water Damage/mildew in the house: no Carpet in the family room: no Carpet in the bedroom: yes Heating: electric Cooling: central Pet: no  Family History: Family History  Problem Relation Age of Onset  . Hypertension Mother   . Diabetes Father   . Asthma Sister   . Cancer Neg Hx   . Stroke Neg Hx   . Angioedema Neg Hx   . Allergic rhinitis Neg Hx   . Atopy Neg Hx   . Eczema Neg Hx   . Immunodeficiency Neg Hx   . Urticaria Neg Hx    Review of Systems  Constitutional: Negative for appetite change, chills, fever and unexpected weight change.  HENT: Positive for congestion, postnasal drip, rhinorrhea and sneezing.   Eyes: Positive for itching.   Respiratory: Positive for cough. Negative for chest tightness, shortness of breath and wheezing.   Cardiovascular: Negative for chest pain.  Gastrointestinal: Negative for abdominal pain.  Genitourinary: Negative for difficulty urinating.  Skin: Negative for rash.  Allergic/Immunologic: Positive for environmental allergies.  Neurological: Positive for headaches.   Objective: BP 118/74   Pulse 89   Temp (!) 97.4 F (36.3 C) (Temporal)   Resp 16   Ht 5' 2.5" (1.588 m)   Wt 179 lb 12.8 oz (81.6 kg)   SpO2 99%   BMI 32.36 kg/m  Body mass index is 32.36 kg/m. Physical Exam Vitals and nursing note reviewed.  Constitutional:      Appearance: Normal appearance. She is well-developed.  HENT:     Head: Normocephalic and atraumatic.     Right Ear: Tympanic membrane and external ear normal.     Left Ear: Tympanic membrane and external ear normal.     Nose: Congestion and rhinorrhea present.     Mouth/Throat:     Mouth: Mucous membranes are moist.     Pharynx: Oropharynx is clear.  Eyes:     Conjunctiva/sclera: Conjunctivae normal.  Cardiovascular:  Rate and Rhythm: Normal rate and regular rhythm.     Heart sounds: Normal heart sounds. No murmur heard. No friction rub. No gallop.   Pulmonary:     Effort: Pulmonary effort is normal.     Breath sounds: Normal breath sounds. No wheezing, rhonchi or rales.  Musculoskeletal:     Cervical back: Neck supple.  Skin:    General: Skin is warm.     Findings: No rash.  Neurological:     Mental Status: She is alert and oriented to person, place, and time.  Psychiatric:        Behavior: Behavior normal.    The plan was reviewed with the patient/family, and all questions/concerned were addressed.  It was my pleasure to see Megan Colon today and participate in her care. Please feel free to contact me with any questions or concerns.  Sincerely,  Rexene Alberts, DO Allergy & Immunology  [facility]  office: [place] office: [phone]

## 2021-05-01 ENCOUNTER — Ambulatory Visit (INDEPENDENT_AMBULATORY_CARE_PROVIDER_SITE_OTHER): Payer: Medicaid Other | Admitting: Allergy

## 2021-05-01 ENCOUNTER — Other Ambulatory Visit: Payer: Self-pay

## 2021-05-01 ENCOUNTER — Encounter: Payer: Self-pay | Admitting: Allergy

## 2021-05-01 VITALS — BP 118/74 | HR 89 | Temp 97.4°F | Resp 16 | Ht 62.5 in | Wt 179.8 lb

## 2021-05-01 DIAGNOSIS — J3089 Other allergic rhinitis: Secondary | ICD-10-CM

## 2021-05-01 DIAGNOSIS — H1013 Acute atopic conjunctivitis, bilateral: Secondary | ICD-10-CM | POA: Diagnosis not present

## 2021-05-01 DIAGNOSIS — R12 Heartburn: Secondary | ICD-10-CM

## 2021-05-01 DIAGNOSIS — R059 Cough, unspecified: Secondary | ICD-10-CM | POA: Insufficient documentation

## 2021-05-01 DIAGNOSIS — Z720 Tobacco use: Secondary | ICD-10-CM | POA: Diagnosis not present

## 2021-05-01 MED ORDER — AZELASTINE HCL 0.1 % NA SOLN: 1.0000 | Freq: Two times a day (BID) | NASAL | 5 refills | Status: DC | PRN

## 2021-05-01 MED ORDER — FLUTICASONE PROPIONATE 50 MCG/ACT NA SUSP: 1.0000 | Freq: Two times a day (BID) | NASAL | 5 refills | Status: DC | PRN

## 2021-05-01 MED ORDER — CROMOLYN SODIUM 4 % OP SOLN: 1.0000 [drp] | Freq: Four times a day (QID) | OPHTHALMIC | 5 refills | Status: DC | PRN

## 2021-05-01 MED ORDER — LEVOCETIRIZINE DIHYDROCHLORIDE 5 MG PO TABS: 5.0000 mg | ORAL_TABLET | Freq: Every evening | ORAL | 5 refills | Status: DC

## 2021-05-01 MED ORDER — MONTELUKAST SODIUM 10 MG PO TABS: 10.0000 mg | ORAL_TABLET | Freq: Every day | ORAL | 5 refills | Status: DC

## 2021-05-01 NOTE — Patient Instructions (Addendum)
Today's skin testing showed: Positive to grass, weed pollen, ragweed, trees, dust mites, cat, dog, mold. Borderline to mouse and tobacco. Results given.   Environmental allergies  Start environmental control measures as below.  Take Xyzal (levocetirizine) 5mg  daily at night. May take twice a day during flares.  Start Singulair (montelukast) 10mg  daily at night. Cautioned that in some children/adults can experience behavioral changes including hyperactivity, agitation, depression, sleep disturbances and suicidal ideations. These side effects are rare, but if you notice them you should notify me and discontinue Singulair (montelukast).  May use azelastine nasal spray 1-2 sprays per nostril twice a day as needed for runny nose/drainage.  May use Flonase (fluticasone) nasal spray 1 spray per nostril twice a day as needed for nasal congestion.   Nasal saline spray (i.e., Simply Saline) or nasal saline lavage (i.e., NeilMed) is recommended as needed and prior to medicated nasal sprays.  May use cromolyn 4% 1 drop in each eye up to four times a day as needed for itchy/watery eyes.    Consider allergy injections for long term control if above medications do not help the symptoms - handout given.    Try to decrease smoking.   Follow up in 2 months or sooner if needed.   Recommend getting eyes check once per year.   Reducing Pollen Exposure . Pollen seasons: trees (spring), grass (summer) and ragweed/weeds (fall). Marland Kitchen Keep windows closed in your home and car to lower pollen exposure.  Susa Simmonds air conditioning in the bedroom and throughout the house if possible.  . Avoid going out in dry windy days - especially early morning. . Pollen counts are highest between 5 - 10 AM and on dry, hot and windy days.  . Save outside activities for late afternoon or after a heavy rain, when pollen levels are lower.  . Avoid mowing of grass if you have grass pollen allergy. Marland Kitchen Be aware that pollen can also  be transported indoors on people and pets.  . Dry your clothes in an automatic dryer rather than hanging them outside where they might collect pollen.  . Rinse hair and eyes before bedtime. Control of House Dust Mite Allergen . Dust mite allergens are a common trigger of allergy and asthma symptoms. While they can be found throughout the house, these microscopic creatures thrive in warm, humid environments such as bedding, upholstered furniture and carpeting. . Because so much time is spent in the bedroom, it is essential to reduce mite levels there.  . Encase pillows, mattresses, and box springs in special allergen-proof fabric covers or airtight, zippered plastic covers.  . Bedding should be washed weekly in hot water (130 F) and dried in a hot dryer. Allergen-proof covers are available for comforters and pillows that can't be regularly washed.  Wendee Copp the allergy-proof covers every few months. Minimize clutter in the bedroom. Keep pets out of the bedroom.  Marland Kitchen Keep humidity less than 50% by using a dehumidifier or air conditioning. You can buy a humidity measuring device called a hygrometer to monitor this.  . If possible, replace carpets with hardwood, linoleum, or washable area rugs. If that's not possible, vacuum frequently with a vacuum that has a HEPA filter. . Remove all upholstered furniture and non-washable window drapes from the bedroom. . Remove all non-washable stuffed toys from the bedroom.  Wash stuffed toys weekly. Pet Allergen Avoidance: . Contrary to popular opinion, there are no "hypoallergenic" breeds of dogs or cats. That is because people are not allergic  to an animal's hair, but to an allergen found in the animal's saliva, dander (dead skin flakes) or urine. Pet allergy symptoms typically occur within minutes. For some people, symptoms can build up and become most severe 8 to 12 hours after contact with the animal. People with severe allergies can experience reactions in public  places if dander has been transported on the pet owners' clothing. Marland Kitchen Keeping an animal outdoors is only a partial solution, since homes with pets in the yard still have higher concentrations of animal allergens. . Before getting a pet, ask your allergist to determine if you are allergic to animals. If your pet is already considered part of your family, try to minimize contact and keep the pet out of the bedroom and other rooms where you spend a great deal of time. . As with dust mites, vacuum carpets often or replace carpet with a hardwood floor, tile or linoleum. . High-efficiency particulate air (HEPA) cleaners can reduce allergen levels over time. . While dander and saliva are the source of cat and dog allergens, urine is the source of allergens from rabbits, hamsters, mice and [place] pigs; so ask a non-allergic family member to clean the animal's cage. . If you have a pet allergy, talk to your allergist about the potential for allergy immunotherapy (allergy shots). This strategy can often provide long-term relief. Mold Control . Mold and fungi can grow on a variety of surfaces provided certain temperature and moisture conditions exist.  . Outdoor molds grow on plants, decaying vegetation and soil. The major outdoor mold, Alternaria and Cladosporium, are found in very high numbers during hot and dry conditions. Generally, a late summer - fall peak is seen for common outdoor fungal spores. Rain will temporarily lower outdoor mold spore count, but counts rise rapidly when the rainy period ends. . The most important indoor molds are Aspergillus and Penicillium. Dark, humid and poorly ventilated basements are ideal sites for mold growth. The next most common sites of mold growth are the bathroom and the kitchen. Outdoor (Seasonal) Mold Control . Use air conditioning and keep windows closed. . Avoid exposure to decaying vegetation. Marland Kitchen Avoid leaf raking. . Avoid grain handling. . Consider wearing a face  mask if working in moldy areas.  Indoor (Perennial) Mold Control  . Maintain humidity below 50%. . Get rid of mold growth on hard surfaces with water, detergent and, if necessary, 5% bleach (do not mix with other cleaners). Then dry the area completely. If mold covers an area more than 10 square feet, consider hiring an indoor environmental professional. . For clothing, washing with soap and water is best. If moldy items cannot be cleaned and dried, throw them away. . Remove sources e.g. contaminated carpets. . Repair and seal leaking roofs or pipes. Using dehumidifiers in damp basements may be helpful, but empty the water and clean units regularly to prevent mildew from forming. All rooms, especially basements, bathrooms and kitchens, require ventilation and cleaning to deter mold and mildew growth. Avoid carpeting on concrete or damp floors, and storing items in damp areas.

## 2021-05-01 NOTE — Assessment & Plan Note (Addendum)
Coughing at times. No prior asthma diagnosis or inhaler use. Current tobacco user.  Today's spirometry showed some mild restriction with 2% improvement in FEV1 post bronchodilator treatment.  Clinically feeling unchanged. . The most common causes of chronic cough include the following: upper airway cough syndrome (UACS) which is caused by variety of rhinitis conditions; asthma; gastroesophageal reflux disease (GERD); chronic bronchitis from cigarette smoking or other inhaled environmental irritants; non-asthmatic eosinophilic bronchitis; and bronchiectasis.  . In prospective studies, these conditions have accounted for up to 94% of the causes of chronic cough in immunocompetent adults.  . The history and physical examination suggest that her cough is multifactorial with contribution from PND, GERD and cigarette exposure.

## 2021-05-01 NOTE — Assessment & Plan Note (Signed)
   See assessment and plan as above for allergic rhinitis.  

## 2021-05-01 NOTE — Assessment & Plan Note (Signed)
   Discussed smoking cessation.

## 2021-05-01 NOTE — Assessment & Plan Note (Signed)
Perennial rhinoconjunctivitis symptoms which has been worsening lately.  Tried Zyrtec, Claritin, Flonase with minimal benefit.  No prior allergy/ENT evaluation.  Today's skin testing showed: Positive to grass, weed pollen, ragweed, trees, dust mites, cat, dog, mold. Borderline to mouse and tobacco.  Start environmental control measures as below.  Take Xyzal (levocetirizine) 5mg  daily at night. May take twice a day during flares.  Start Singulair (montelukast) 10mg  daily at night. Cautioned that in some children/adults can experience behavioral changes including hyperactivity, agitation, depression, sleep disturbances and suicidal ideations. These side effects are rare, but if you notice them you should notify me and discontinue Singulair (montelukast).  May use azelastine nasal spray 1-2 sprays per nostril twice a day as needed for runny nose/drainage.  May use Flonase (fluticasone) nasal spray 1 spray per nostril twice a day as needed for nasal congestion.   Nasal saline spray (i.e., Simply Saline) or nasal saline lavage (i.e., NeilMed) is recommended as needed and prior to medicated nasal sprays.  May use cromolyn 4% 1 drop in each eye up to four times a day as needed for itchy/watery eyes.   Consider allergy injections for long term control if above medications do not help the symptoms - handout given.   Recommend getting eyes check once per year.

## 2021-05-01 NOTE — Assessment & Plan Note (Signed)
   Continue Protonix 40mg  daily as prescribed.

## 2021-06-03 ENCOUNTER — Other Ambulatory Visit: Payer: Self-pay | Admitting: Adult Health

## 2021-07-01 ENCOUNTER — Ambulatory Visit: Payer: Medicaid Other | Admitting: Allergy

## 2021-07-01 NOTE — Progress Notes (Deleted)
Follow Up Note  RE: Megan Colon MRN: [id number] DOB: [birth year] Date of Office Visit: 07/01/2021  Referring provider: Camillia Herter, NP Primary care provider: Camillia Herter, NP  Chief Complaint: No chief complaint on file.  History of Present Illness: I had the pleasure of seeing Megan Colon for a follow up visit at the [facility] on 07/01/2021. She is a 45 y.o. female, who is being followed for allergic rhinoconjunctivitis, coughing, heartburn. Her previous allergy office visit was on 05/01/2021 with Dr. Maudie Mercury. Today is a regular follow up visit.  Other allergic rhinitis Perennial rhinoconjunctivitis symptoms which has been worsening lately.  Tried Zyrtec, Claritin, Flonase with minimal benefit.  No prior allergy/ENT evaluation. Today's skin testing showed: Positive to grass, weed pollen, ragweed, trees, dust mites, cat, dog, mold. Borderline to mouse and tobacco. Start environmental control measures as below. Take Xyzal (levocetirizine) 5mg  daily at night. May take twice a day during flares. Start Singulair (montelukast) 10mg  daily at night. Cautioned that in some children/adults can experience behavioral changes including hyperactivity, agitation, depression, sleep disturbances and suicidal ideations. These side effects are rare, but if you notice them you should notify me and discontinue Singulair (montelukast). May use azelastine nasal spray 1-2 sprays per nostril twice a day as needed for runny nose/drainage. May use Flonase (fluticasone) nasal spray 1 spray per nostril twice a day as needed for nasal congestion. Nasal saline spray (i.e., Simply Saline) or nasal saline lavage (i.e., NeilMed) is recommended as needed and prior to medicated nasal sprays. May use cromolyn 4% 1 drop in each eye up to four times a day as needed for itchy/watery eyes. Consider allergy injections for long term control if above medications do not help the symptoms - handout  given. Recommend getting eyes check once per year.    Allergic conjunctivitis of both eyes See assessment and plan as above for allergic rhinitis.   Coughing Coughing at times. No prior asthma diagnosis or inhaler use. Current tobacco user. Today's spirometry showed some mild restriction with 2% improvement in FEV1 post bronchodilator treatment.  Clinically feeling unchanged. The most common causes of chronic cough include the following: upper airway cough syndrome (UACS) which is caused by variety of rhinitis conditions; asthma; gastroesophageal reflux disease (GERD); chronic bronchitis from cigarette smoking or other inhaled environmental irritants; non-asthmatic eosinophilic bronchitis; and bronchiectasis. In prospective studies, these conditions have accounted for up to 94% of the causes of chronic cough in immunocompetent adults. The history and physical examination suggest that her cough is multifactorial with contribution from PND, GERD and cigarette exposure.    Tobacco user Discussed smoking cessation.   Heartburn Continue Protonix 40mg  daily as prescribed.   Return in about 2 months (around 07/01/2021).    Assessment and Plan: Megan Colon is a 45 y.o. female with: No problem-specific Assessment & Plan notes found for this encounter.  No follow-ups on file.  No orders of the defined types were placed in this encounter.  Lab Orders  No laboratory test(s) ordered today    Diagnostics: Spirometry:  Tracings reviewed. Her effort: {Blank single:19197::"Good reproducible efforts.","It was hard to get consistent efforts and there is a question as to whether this reflects a maximal maneuver.","Poor effort, data can not be interpreted."} FVC: ***L FEV1: ***L, ***% predicted FEV1/FVC ratio: ***% Interpretation: {Blank single:19197::"Spirometry consistent with mild obstructive disease","Spirometry consistent with moderate obstructive disease","Spirometry consistent with severe  obstructive disease","Spirometry consistent with possible restrictive disease","Spirometry consistent with mixed obstructive and restrictive disease","Spirometry uninterpretable  due to technique","Spirometry consistent with normal pattern","No overt abnormalities noted given today's efforts"}.  Please see scanned spirometry results for details.  Skin Testing: {Blank single:19197::"Select foods","Environmental allergy panel","Environmental allergy panel and select foods","Food allergy panel","None","Deferred due to recent antihistamines use"}. Positive test to: ***. Negative test to: ***.  Results discussed with patient/family.   Medication List:  Current Outpatient Medications  Medication Sig Dispense Refill  . azelastine (ASTELIN) 0.1 % nasal spray Place 1-2 sprays into both nostrils 2 (two) times daily as needed (runny nose). Use in each nostril as directed 30 mL 5  . cromolyn (OPTICROM) 4 % ophthalmic solution Place 1 drop into both eyes 4 (four) times daily as needed (itchy/watery eyes). 10 mL 5  . fluticasone (FLONASE) 50 MCG/ACT nasal spray Place 1 spray into both nostrils 2 (two) times daily as needed (nasal congestion). 16 g 5  . levocetirizine (XYZAL) 5 MG tablet Take 1 tablet (5 mg total) by mouth every evening. 30 tablet 5  . montelukast (SINGULAIR) 10 MG tablet Take 1 tablet (10 mg total) by mouth at bedtime. 30 tablet 5  . pantoprazole (PROTONIX) 40 MG tablet Take 1 tablet (40 mg total) by mouth daily. 90 tablet 0  . SUMAtriptan (IMITREX) 100 MG tablet Take 1 tablet at the onset of migraine. May repeat in 2 hours if headache persists or recurs. 10 tablet 11  . traZODone (DESYREL) 50 MG tablet Take 0.5-1 tablets (25-50 mg total) by mouth at bedtime as needed for sleep. 30 tablet 2   No current facility-administered medications for this visit.   Allergies: Allergies  Allergen Reactions  . Latex Itching   I reviewed her past medical history, social history, family history, and  environmental history and no significant changes have been reported from her previous visit.  Review of Systems  Constitutional:  Negative for appetite change, chills, fever and unexpected weight change.  HENT:  Positive for congestion, postnasal drip, rhinorrhea and sneezing.   Eyes:  Positive for itching.  Respiratory:  Positive for cough. Negative for chest tightness, shortness of breath and wheezing.   Cardiovascular:  Negative for chest pain.  Gastrointestinal:  Negative for abdominal pain.  Genitourinary:  Negative for difficulty urinating.  Skin:  Negative for rash.  Allergic/Immunologic: Positive for environmental allergies.  Neurological:  Positive for headaches.   Objective: There were no vitals taken for this visit. There is no height or weight on file to calculate BMI. Physical Exam Vitals and nursing note reviewed.  Constitutional:      Appearance: Normal appearance. She is well-developed.  HENT:     Head: Normocephalic and atraumatic.     Right Ear: Tympanic membrane and external ear normal.     Left Ear: Tympanic membrane and external ear normal.     Nose: Congestion and rhinorrhea present.     Mouth/Throat:     Mouth: Mucous membranes are moist.     Pharynx: Oropharynx is clear.  Eyes:     Conjunctiva/sclera: Conjunctivae normal.  Cardiovascular:     Rate and Rhythm: Normal rate and regular rhythm.     Heart sounds: Normal heart sounds. No murmur heard.   No friction rub. No gallop.  Pulmonary:     Effort: Pulmonary effort is normal.     Breath sounds: Normal breath sounds. No wheezing, rhonchi or rales.  Musculoskeletal:     Cervical back: Neck supple.  Skin:    General: Skin is warm.     Findings: No rash.  Neurological:  Mental Status: She is alert and oriented to person, place, and time.  Psychiatric:        Behavior: Behavior normal.  Previous notes and tests were reviewed. The plan was reviewed with the patient/family, and all  questions/concerned were addressed.  It was my pleasure to see Robby today and participate in her care. Please feel free to contact me with any questions or concerns.  Sincerely,  Rexene Alberts, DO Allergy & Immunology  [facility] office: [place] office: [phone]

## 2021-10-29 IMAGING — DX DG CERVICAL SPINE COMPLETE 4+V
5 series · 5 of 5 positions shown · non-contrast
Comparison: No prior.

CLINICAL DATA: Chronic neck pain.

EXAM:
CERVICAL SPINE - COMPLETE 4+ VIEW

[cervical spine lat]
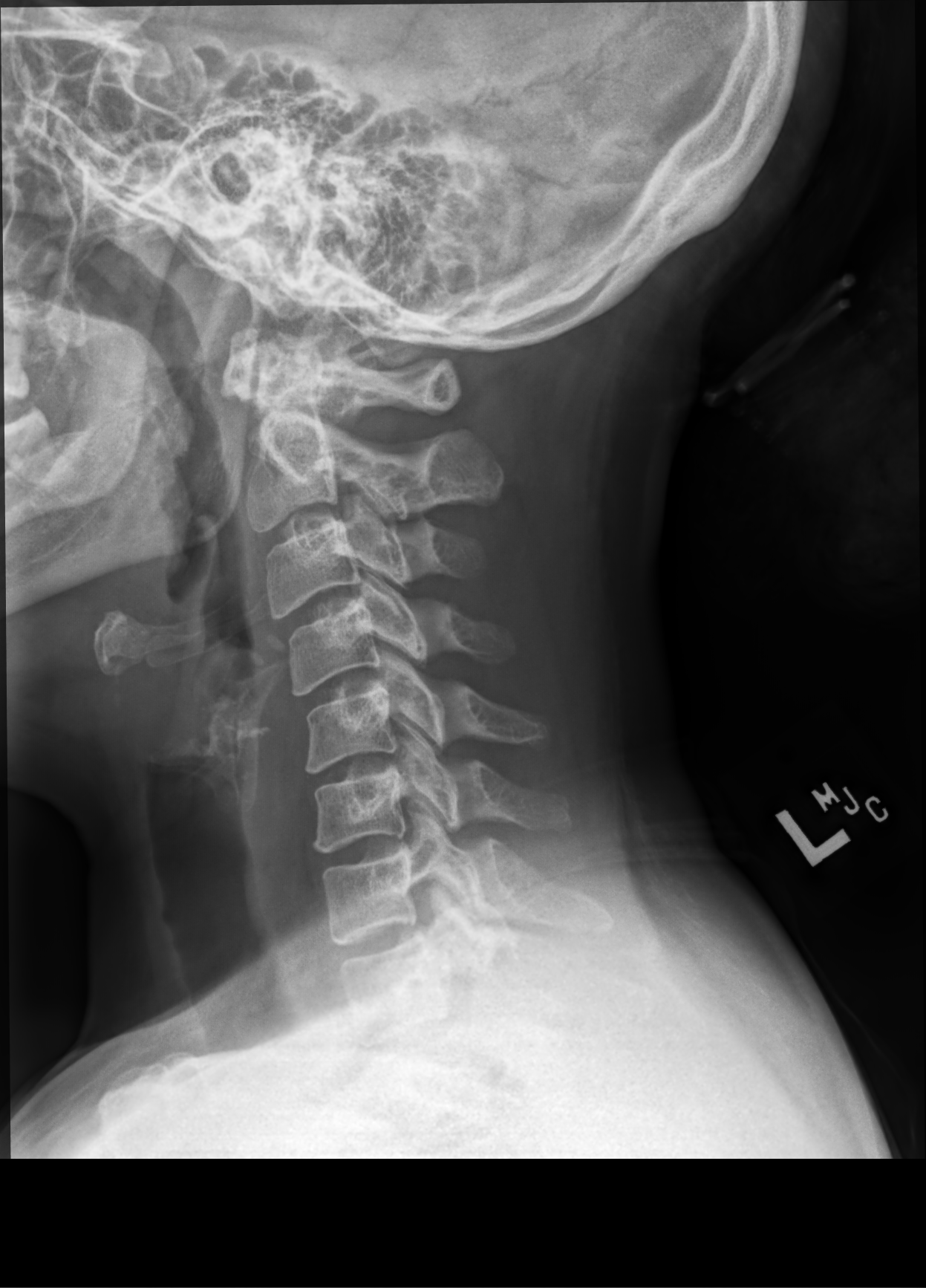

[cervical spine ap (1 of 4)]
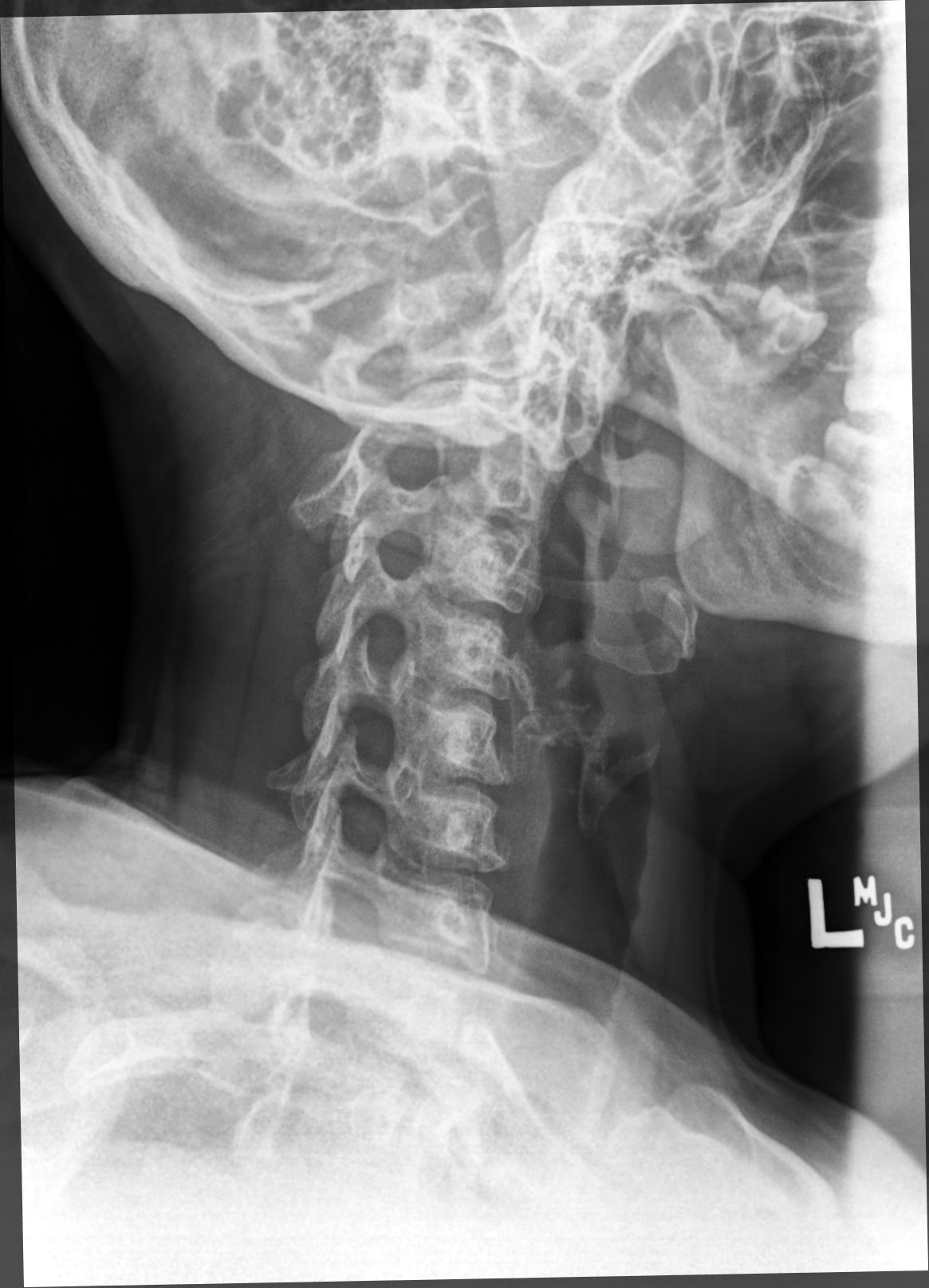

[cervical spine ap (2 of 4)]
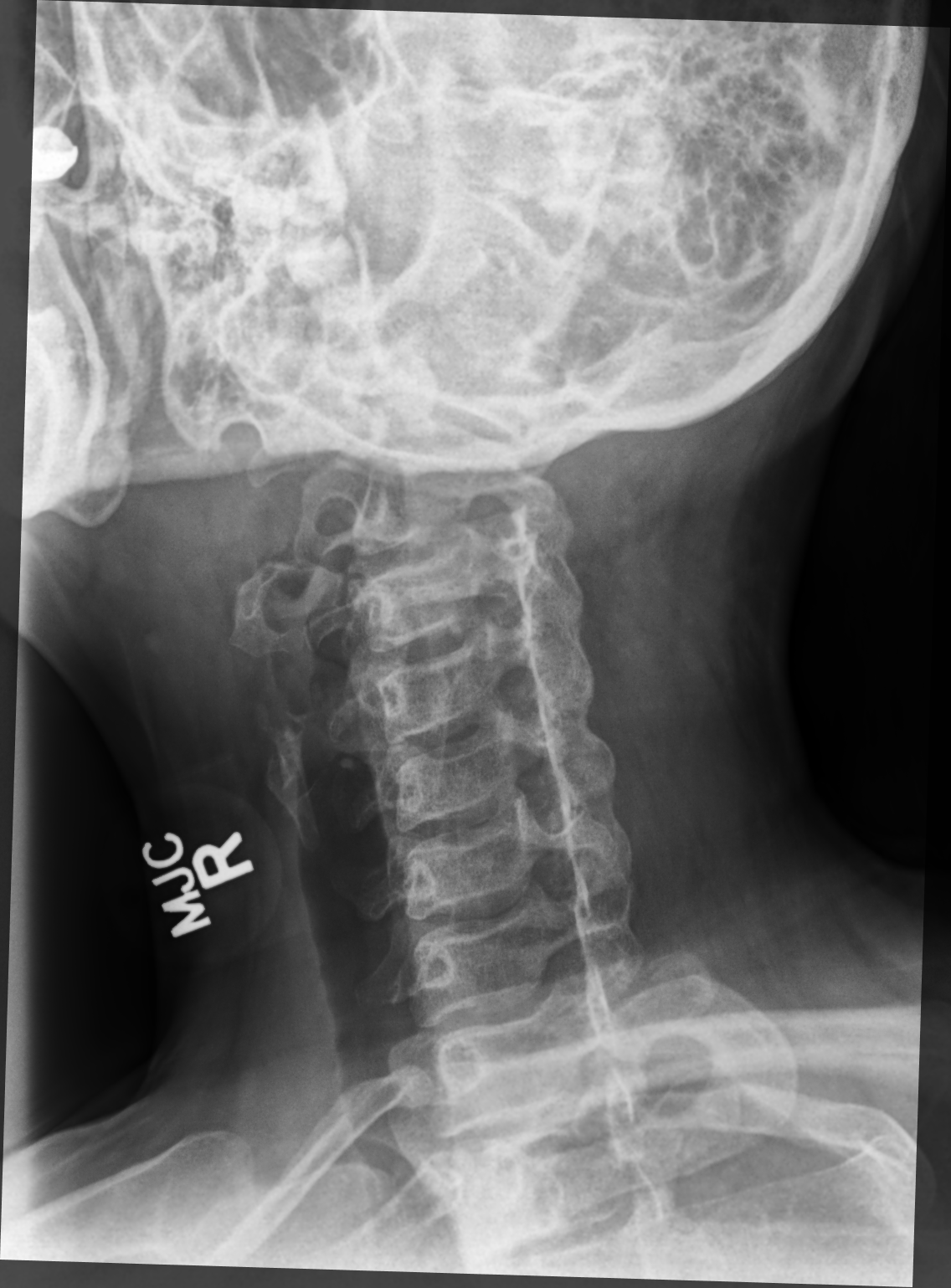

[cervical spine ap (3 of 4)]
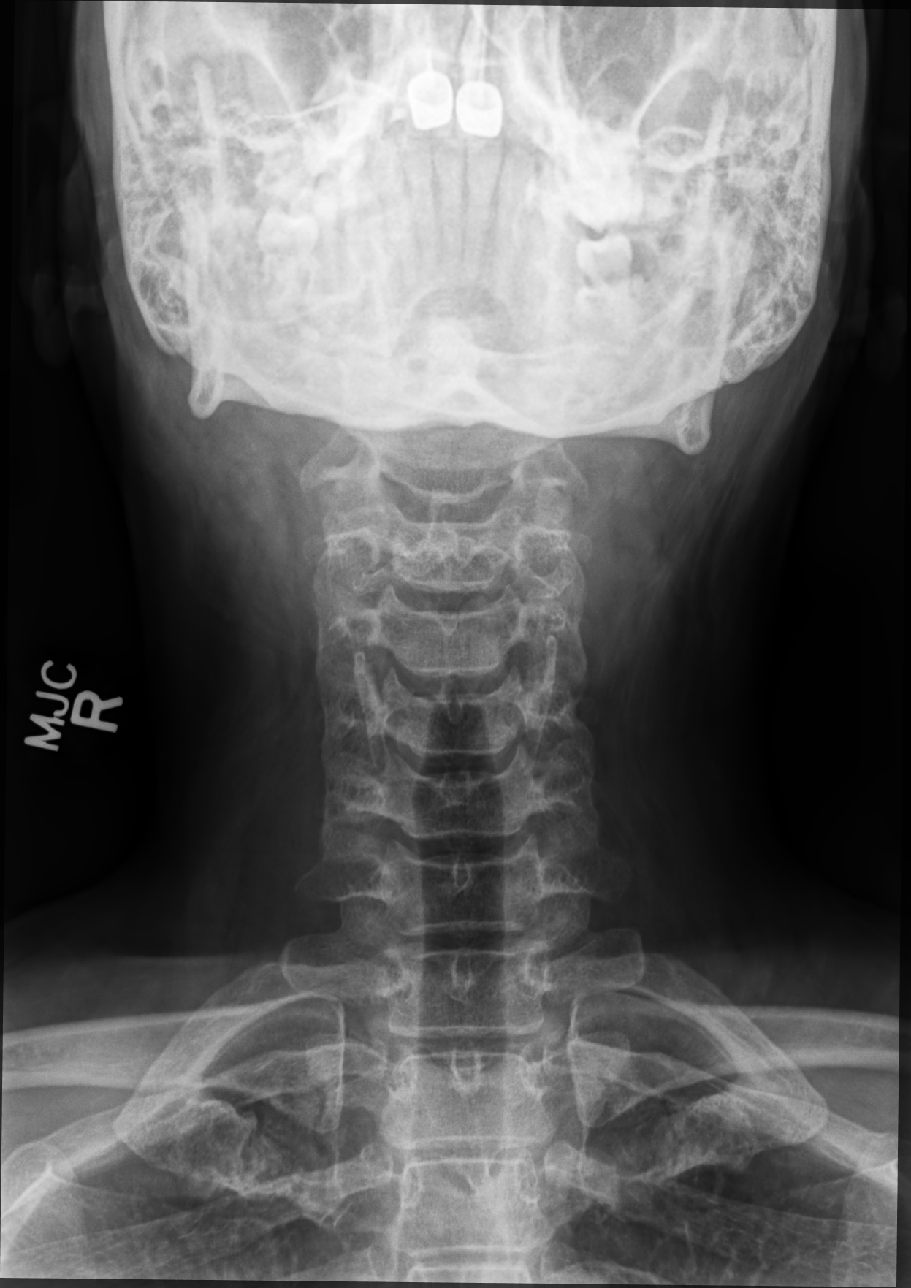

[cervical spine ap (4 of 4)]
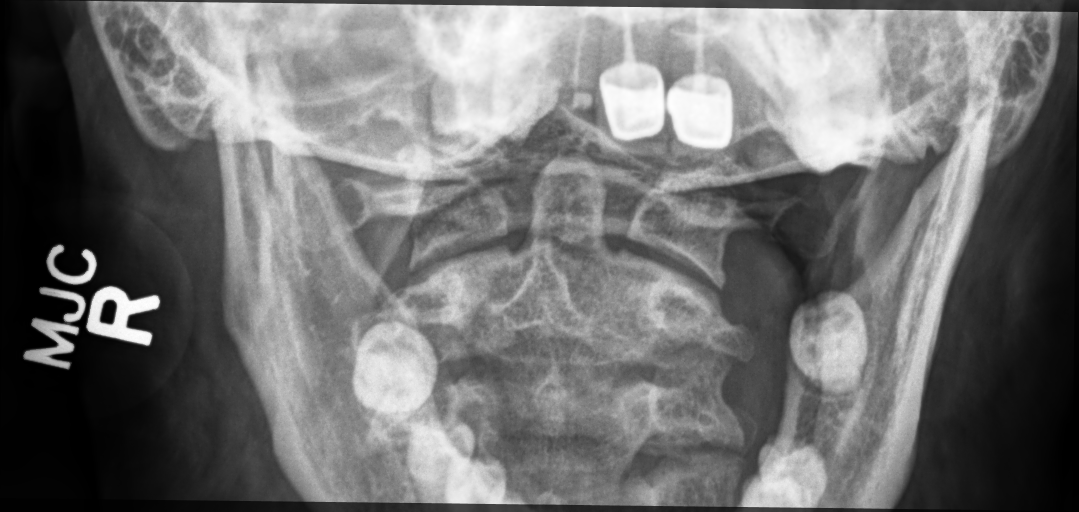

[5 of 5 positions shown; findings below may reference images not displayed]

FINDINGS: Loss of normal cervical lordosis. Mild degenerative change C5-C6,
C6-C7, and C7-T1 with mild disc degeneration and endplate osteophyte
formation. No acute bony or joint abnormality. No evidence of
fracture or dislocation.
IMPRESSION: Loss of normal cervical lordosis. Mild degenerative changes lower
cervical spine. No acute abnormality identified.

## 2021-12-15 ENCOUNTER — Other Ambulatory Visit: Payer: Self-pay

## 2021-12-15 ENCOUNTER — Encounter: Payer: Self-pay | Admitting: Emergency Medicine

## 2021-12-15 ENCOUNTER — Ambulatory Visit
Admission: EM | Admit: 2021-12-15 | Discharge: 2021-12-15 | Disposition: A | Discharge status: Home / Self Care | Payer: Medicaid Other

## 2021-12-15 DIAGNOSIS — H5789 Other specified disorders of eye and adnexa: Secondary | ICD-10-CM | POA: Diagnosis not present

## 2021-12-15 NOTE — ED Triage Notes (Signed)
Right eye redness, swelling and pain since Saturday.  Bought pink eye relief drops (otc) has used since Saturday.  Reports no relief at all..  patient does not wear contacts .

## 2021-12-15 NOTE — ED Provider Notes (Signed)
[facility]    CSN: [id number] Arrival date & time: 12/15/21  1025      History   Chief Complaint Chief Complaint  Patient presents with   Eye Problem    HPI Megan Colon is a 45 y.o. female.   Patient presents with right eye redness, swelling, pain that started approximately 3 days ago.  Denies any prior injury, trauma, foreign body to the eye.  Patient having watery drainage but no purulent drainage.  Patient used over-the-counter pinkeye relief drops with no improvement.  Patient reports that she also feels like her face to the side of her eye "feels bruised".  Patient is having some mild blurry vision.  She does not wear contacts.  She thought that she was having an allergic reaction to something so she has been taking Xyzal with no improvement.   Eye Problem  Past Medical History:  Diagnosis Date   Allergy    [employer] 12/09/2020   Anxiety    [employer] 12/09/2020   Arthritis    GERD (gastroesophageal reflux disease)    Lung cancer (HCC)    Father   Migraines    since childhood    Patient Active Problem List   Diagnosis Date Noted   Other allergic rhinitis 05/01/2021   Allergic conjunctivitis of both eyes 05/01/2021   Tobacco user 05/01/2021   Coughing 05/01/2021   Heartburn 05/01/2021   Neck pain, chronic 07/31/2020   Degenerative disc disease, cervical 07/12/2020   Gestational hypertension 05/06/2017    Past Surgical History:  Procedure Laterality Date   CESAREAN SECTION N/A 05/08/2017   Procedure: CESAREAN SECTION;  Surgeon: Florian Buff, MD;  Location: [facility];  Service: Obstetrics;  Laterality: N/A;   CESAREAN SECTION N/A    [employer] 12/09/2020   LASIK Bilateral 2006    OB History     Gravida  6   Para  2   Term  2   Preterm      AB  3   Living  3      SAB      IAB  3   Ectopic      Multiple      Live Births  3            Home Medications    Prior to Admission medications   Medication Sig  Start Date End Date Taking? Authorizing Provider  azelastine (ASTELIN) 0.1 % nasal spray Place 1-2 sprays into both nostrils 2 (two) times daily as needed (runny nose). Use in each nostril as directed Patient not taking: Reported on 12/15/2021 05/01/21   Garnet Sierras, DO  cromolyn (OPTICROM) 4 % ophthalmic solution Place 1 drop into both eyes 4 (four) times daily as needed (itchy/watery eyes). Patient not taking: Reported on 12/15/2021 05/01/21   Garnet Sierras, DO  fluticasone [facility]) 50 MCG/ACT nasal spray Place 1 spray into both nostrils 2 (two) times daily as needed (nasal congestion). Patient not taking: Reported on 12/15/2021 05/01/21   Garnet Sierras, DO  levocetirizine (XYZAL) 5 MG tablet Take 1 tablet (5 mg total) by mouth every evening. 05/01/21   Garnet Sierras, DO  montelukast (SINGULAIR) 10 MG tablet Take 1 tablet (10 mg total) by mouth at bedtime. Patient not taking: Reported on 12/15/2021 05/01/21   Garnet Sierras, DO  pantoprazole (PROTONIX) 40 MG tablet Take 1 tablet (40 mg total) by mouth daily. 04/01/21   Camillia Herter, NP  SUMAtriptan (IMITREX) 100 MG tablet  Take 1 tablet at the onset of migraine. May repeat in 2 hours if headache persists or recurs. 04/17/20   Ward Givens, NP  traZODone (DESYREL) 50 MG tablet Take 0.5-1 tablets (25-50 mg total) by mouth at bedtime as needed for sleep. 04/01/21   Camillia Herter, NP    Family History Family History  Problem Relation Age of Onset   Hypertension Mother    Diabetes Father    Asthma Sister    Cancer Neg Hx    Stroke Neg Hx    Angioedema Neg Hx    Allergic rhinitis Neg Hx    Atopy Neg Hx    Eczema Neg Hx    Immunodeficiency Neg Hx    Urticaria Neg Hx     Social History Social History   Tobacco Use   Smoking status: Every Day    Packs/day: 0.25    Types: Cigarettes   Smokeless tobacco: Never  Vaping Use   Vaping Use: Never used  Substance Use Topics   Alcohol use: No   Drug use: No     Allergies   Latex   Review of  Systems Review of Systems Per HPI  Physical Exam Triage Vital Signs ED Triage Vitals  Enc Vitals Group     BP 12/15/21 1238 (!) 138/92     Pulse Rate 12/15/21 1238 97     Resp 12/15/21 1238 18     Temp 12/15/21 1238 98.7 F (37.1 C)     Temp Source 12/15/21 1238 Oral     SpO2 12/15/21 1238 96 %     Weight --      Height --      Head Circumference --      Peak Flow --      Pain Score 12/15/21 1234 9     Pain Loc --      Pain Edu? --      Excl. in [place]? --    No data found.  Updated Vital Signs BP (!) 138/92 (BP Location: Left Arm) Comment (BP Location): large cuff   Pulse 97    Temp 98.7 F (37.1 C) (Oral)    Resp 18    LMP 11/26/2021    SpO2 96%   Visual Acuity Right Eye Distance: 20/50 Left Eye Distance: 20/100 Bilateral Distance: 20/50  Right Eye Near:   Left Eye Near:    Bilateral Near:     Physical Exam Constitutional:      General: She is not in acute distress.    Appearance: Normal appearance. She is not toxic-appearing or diaphoretic.  HENT:     Head: Normocephalic and atraumatic.  Eyes:     General: Lids are normal. Lids are everted, no foreign bodies appreciated. Gaze aligned appropriately.        Right eye: No foreign body, discharge or hordeolum.        Left eye: No foreign body, discharge or hordeolum.     Extraocular Movements: Extraocular movements intact.     Conjunctiva/sclera:     Right eye: Right conjunctiva is injected. No chemosis, exudate or hemorrhage.    Pupils: Pupils are equal, round, and reactive to light.     Left eye: No corneal abrasion or fluorescein uptake.     Comments: Watery discharge from right eye.  Scleral redness present.  No Fluorescein stain reuptake.  Patient does have some mild swelling noted to right lower lid.  No pain with extraocular movements.  Pulmonary:     Effort:  Pulmonary effort is normal.  Neurological:     General: No focal deficit present.     Mental Status: She is alert and oriented to person, place, and  time. Mental status is at baseline.  Psychiatric:        Mood and Affect: Mood normal.        Behavior: Behavior normal.        Thought Content: Thought content normal.        Judgment: Judgment normal.     UC Treatments / Results  Labs (all labs ordered are listed, but only abnormal results are displayed) Labs Reviewed - No data to display  EKG   Radiology No results found.  Procedures Procedures (including critical care time)  Medications Ordered in UC Medications - No data to display  Initial Impression / Assessment and Plan / UC Course  I have reviewed the triage vital signs and the nursing notes.  Pertinent labs & imaging results that were available during my care of the patient were reviewed by me and considered in my medical decision making (see chart for details).     On-call ophthalmology was called to set up an appointment for patient today at 2:45 for more in depth evaluation but patient declined appointment as this eye doctor does not take Medicaid and she does not want to do self-pay.  Advised patient that she will need to follow-up with her eye doctor today as soon as possible for further evaluation and management.  Patient called eye doctor prior to leaving urgent care and appointment was set up for patient to be seen today. Final Clinical Impressions(s) / UC Diagnoses   Final diagnoses:  Redness of right eye     Discharge Instructions      Please call your eye doctor today to set up an appointment as soon as possible for further evaluation and management.    ED Prescriptions   None    PDMP not reviewed this encounter.   Teodora Medici, [place] 12/15/21 [phone]

## 2021-12-15 NOTE — Discharge Instructions (Signed)
Please call your eye doctor today to set up an appointment as soon as possible for further evaluation and management.

## 2021-12-17 ENCOUNTER — Ambulatory Visit: Payer: Medicaid Other | Admitting: Physician Assistant

## 2021-12-17 ENCOUNTER — Other Ambulatory Visit: Payer: Self-pay

## 2021-12-17 VITALS — BP 143/92 | HR 84 | Temp 98.3°F | Resp 18 | Ht 60.0 in | Wt 182.0 lb

## 2021-12-17 DIAGNOSIS — H109 Unspecified conjunctivitis: Secondary | ICD-10-CM

## 2021-12-17 DIAGNOSIS — H5711 Ocular pain, right eye: Secondary | ICD-10-CM

## 2021-12-17 MED ORDER — DOXYCYCLINE HYCLATE 100 MG PO CAPS: 100.0000 mg | ORAL_CAPSULE | Freq: Two times a day (BID) | ORAL | 0 refills | Status: AC

## 2021-12-17 MED ORDER — IBUPROFEN 600 MG PO TABS: 600.0000 mg | ORAL_TABLET | Freq: Three times a day (TID) | ORAL | 0 refills | Status: DC | PRN

## 2021-12-17 NOTE — Progress Notes (Signed)
Patient has not eaten or taken medication today/ Patient reports pain beginning in the right eye this past Saturday. Patient reports eye swelling, facial and ear swelling. Patient reported to UC who completed snellen test and referred patient to her ophthalmologist. Patient specialist prescribed eye drop which caused extreme swelling and redness to lower lid. Patient reports hard indurations under the eye and next to ear. Patient reports no Hx of it. Patient reports elevated BP post delivery and request this concern be addressed.

## 2021-12-17 NOTE — Progress Notes (Signed)
Established Patient Office Visit  Subjective:  Patient ID: Megan Colon, female    DOB: 1976-02-12  Age: 45 y.o. MRN: [id number]  CC:  Chief Complaint  Patient presents with   Eye Problem    right    HPI Megan Colon states that she was seen at urgent care on December 26 for the same complaint.  Note from that visit  On-call ophthalmology was called to set up an appointment for patient today at 2:45 for more in depth evaluation but patient declined appointment as this eye doctor does not take Medicaid and she does not want to do self-pay.  Advised patient that she will need to follow-up with her eye doctor today as soon as possible for further evaluation and management.  Patient called eye doctor prior to leaving urgent care and appointment was set up for patient to be seen today.  States today that she did have an appointment with her eye doctor, states that she was prescribed TobraDex, which she used as directed with last use this morning.  States that she feels her eye is worsening, states it is becoming more swollen, continues to have clear drainage, states that she has pain in her right sinus area as well.  Denies fever, continues to deny sick contacts.  Past Medical History:  Diagnosis Date   Allergy    Phreesia 12/09/2020   Anxiety    Phreesia 12/09/2020   Arthritis    GERD (gastroesophageal reflux disease)    Lung cancer (HCC)    Father   Migraines    since childhood    Past Surgical History:  Procedure Laterality Date   CESAREAN SECTION N/A 05/08/2017   Procedure: CESAREAN SECTION;  Surgeon: Florian Buff, MD;  Location: [facility];  Service: Obstetrics;  Laterality: N/A;   CESAREAN SECTION N/A    Phreesia 12/09/2020   LASIK Bilateral 2006    Family History  Problem Relation Age of Onset   Hypertension Mother    Diabetes Father    Asthma Sister    Cancer Neg Hx    Stroke Neg Hx    Angioedema Neg Hx    Allergic rhinitis Neg Hx    Atopy Neg Hx     Eczema Neg Hx    Immunodeficiency Neg Hx    Urticaria Neg Hx     Social History   Socioeconomic History   Marital status: Single    Spouse name: Not on file   Number of children: Not on file   Years of education: Not on file   Highest education level: Not on file  Occupational History   Not on file  Tobacco Use   Smoking status: Every Day    Packs/day: 0.25    Types: Cigarettes   Smokeless tobacco: Never  Vaping Use   Vaping Use: Never used  Substance and Sexual Activity   Alcohol use: No   Drug use: No   Sexual activity: Yes    Birth control/protection: Condom  Other Topics Concern   Not on file  Social History Narrative   Not on file   Social Determinants of Health   Financial Resource Strain: Not on file  Food Insecurity: Not on file  Transportation Needs: Not on file  Physical Activity: Not on file  Stress: Not on file  Social Connections: Not on file  Intimate Partner Violence: Not on file    Outpatient Medications Prior to Visit  Medication Sig Dispense Refill   levocetirizine (XYZAL) 5 MG  tablet Take 1 tablet (5 mg total) by mouth every evening. 30 tablet 5   pantoprazole (PROTONIX) 40 MG tablet Take 1 tablet (40 mg total) by mouth daily. 90 tablet 0   SUMAtriptan (IMITREX) 100 MG tablet Take 1 tablet at the onset of migraine. May repeat in 2 hours if headache persists or recurs. 10 tablet 11   TOBRADEX ophthalmic solution Place 1 drop into the right eye 4 (four) times daily.     traZODone (DESYREL) 50 MG tablet Take 0.5-1 tablets (25-50 mg total) by mouth at bedtime as needed for sleep. 30 tablet 2   azelastine (ASTELIN) 0.1 % nasal spray Place 1-2 sprays into both nostrils 2 (two) times daily as needed (runny nose). Use in each nostril as directed (Patient not taking: Reported on 12/15/2021) 30 mL 5   cromolyn (OPTICROM) 4 % ophthalmic solution Place 1 drop into both eyes 4 (four) times daily as needed (itchy/watery eyes). (Patient not taking: Reported on  12/15/2021) 10 mL 5   fluticasone (FLONASE) 50 MCG/ACT nasal spray Place 1 spray into both nostrils 2 (two) times daily as needed (nasal congestion). (Patient not taking: Reported on 12/15/2021) 16 g 5   montelukast (SINGULAIR) 10 MG tablet Take 1 tablet (10 mg total) by mouth at bedtime. (Patient not taking: Reported on 12/15/2021) 30 tablet 5   No facility-administered medications prior to visit.    Allergies  Allergen Reactions   Latex Itching    ROS Review of Systems  Constitutional:  Negative for chills and fever.  HENT:  Positive for facial swelling, sinus pressure and sinus pain. Negative for congestion, sneezing, sore throat and trouble swallowing.   Eyes:  Positive for photophobia, pain, discharge, redness and visual disturbance.  Respiratory:  Negative for cough.   Cardiovascular:  Negative for chest pain.  Gastrointestinal: Negative.   Endocrine: Negative.   Genitourinary: Negative.   Musculoskeletal: Negative.   Skin: Negative.   Allergic/Immunologic: Negative.   Neurological: Negative.   Hematological: Negative.   Psychiatric/Behavioral: Negative.       Objective:    Physical Exam Vitals and nursing note reviewed.  Constitutional:      Appearance: Normal appearance.  HENT:     Head: Normocephalic and atraumatic.     Right Ear: Tympanic membrane, ear canal and external ear normal.     Left Ear: Tympanic membrane, ear canal and external ear normal.     Nose:     Right Sinus: Maxillary sinus tenderness and frontal sinus tenderness present.     Mouth/Throat:     Mouth: Mucous membranes are moist.     Pharynx: Oropharynx is clear.  Eyes:     Conjunctiva/sclera:     Right eye: Right conjunctiva is injected. Hemorrhage present.     Comments: See photo  Cardiovascular:     Rate and Rhythm: Normal rate and regular rhythm.     Pulses: Normal pulses.  Pulmonary:     Effort: Pulmonary effort is normal.     Breath sounds: Normal breath sounds.  Musculoskeletal:         General: Normal range of motion.     Cervical back: Normal range of motion and neck supple.  Skin:    General: Skin is warm and dry.  Neurological:     Mental Status: She is alert and oriented to person, place, and time.  Psychiatric:        Mood and Affect: Mood normal.        Thought Content: Thought  content normal.        Judgment: Judgment normal.     Verbal consent given by patient to have photo included in chart   BP (!) 143/92 (BP Location: Left Arm, Patient Position: Sitting, Cuff Size: Large)    Pulse 84    Temp 98.3 F (36.8 C) (Oral)    Resp 18    Ht 5' (1.524 m)    Wt 182 lb (82.6 kg)    LMP 11/26/2021    SpO2 96%    BMI 35.54 kg/m  Wt Readings from Last 3 Encounters:  12/17/21 182 lb (82.6 kg)  05/01/21 179 lb 12.8 oz (81.6 kg)  04/01/21 177 lb (80.3 kg)     Health Maintenance Due  Topic Date Due   Pneumococcal Vaccine 4-48 Years old (1 - PCV) Never done   PAP SMEAR-Modifier  10/08/2019   COVID-19 Vaccine (3 - Booster for Pfizer series) 11/14/2020   INFLUENZA VACCINE  Never done   COLONOSCOPY (Pts 45-92yrs Insurance coverage will need to be confirmed)  Never done    There are no preventive care reminders to display for this patient.  Lab Results  Component Value Date   TSH 1.500 11/08/2019   Lab Results  Component Value Date   WBC 12.6 (H) 08/29/2020   HGB 14.6 08/29/2020   HCT 44.7 08/29/2020   MCV 89.8 08/29/2020   PLT 259 08/29/2020   Lab Results  Component Value Date   NA 137 08/29/2020   K 4.3 08/29/2020   CO2 26 08/29/2020   GLUCOSE 138 (H) 08/29/2020   BUN 11 08/29/2020   CREATININE 0.74 08/29/2020   BILITOT 0.4 07/24/2020   ALKPHOS 60 07/24/2020   AST 10 (L) 07/24/2020   ALT 16 07/24/2020   PROT 7.4 07/24/2020   ALBUMIN 4.3 07/24/2020   CALCIUM 9.8 08/29/2020   ANIONGAP 10 08/29/2020   Lab Results  Component Value Date   CHOL 175 11/08/2019   Lab Results  Component Value Date   HDL 54 11/08/2019   Lab Results   Component Value Date   LDLCALC 108 (H) 11/08/2019   Lab Results  Component Value Date   TRIG 66 11/08/2019   Lab Results  Component Value Date   CHOLHDL 3.2 11/08/2019   Lab Results  Component Value Date   HGBA1C 5.2 01/24/2020      Assessment & Plan:   Problem List Items Addressed This Visit   None Visit Diagnoses     Bacterial conjunctivitis of right eye    -  Primary   Relevant Medications   doxycycline (VIBRAMYCIN) 100 MG capsule   Acute right eye pain       Relevant Medications   ibuprofen (ADVIL) 600 MG tablet       Meds ordered this encounter  Medications   doxycycline (VIBRAMYCIN) 100 MG capsule    Sig: Take 1 capsule (100 mg total) by mouth 2 (two) times daily for 10 days.    Dispense:  20 capsule    Refill:  0    Stop tobradex    Order Specific Question:   Supervising Provider    Answer:   Asencion Noble E [[id number]]   ibuprofen (ADVIL) 600 MG tablet    Sig: Take 1 tablet (600 mg total) by mouth every 8 (eight) hours as needed.    Dispense:  30 tablet    Refill:  0    Order Specific Question:   Supervising Provider    Answer:   Joya Gaskins,  PATRICK E [[id number]]  1. Bacterial conjunctivitis of right eye Reaction from TobraDex versus worsening conjunctivitis, trial doxycycline, encourage patient to hold TobraDex at this time, patient education given on supportive care, red flags for prompt reevaluation. - doxycycline (VIBRAMYCIN) 100 MG capsule; Take 1 capsule (100 mg total) by mouth 2 (two) times daily for 10 days.  Dispense: 20 capsule; Refill: 0  2. Acute right eye pain Trial ibuprofen - ibuprofen (ADVIL) 600 MG tablet; Take 1 tablet (600 mg total) by mouth every 8 (eight) hours as needed.  Dispense: 30 tablet; Refill: 0    I have reviewed the patient's medical history (PMH, PSH, Social History, Family History, Medications, and allergies) , and have been updated if relevant. I spent 30 minutes reviewing chart and  face to face time with  patient.    Follow-up: Return if symptoms worsen or fail to improve.    Loraine Grip Mayers, PA-C

## 2021-12-17 NOTE — Patient Instructions (Signed)
You are going to use doxycycline twice a day for the next 5 days, I do encourage you to stop using the eyedrops at this time.  You can use Motrin to help you with eye pain.  I also encourage you to use warm compresses several times throughout the day.  Please let us know if there is anything else we can do for you.  Kennieth Rad, PA-C Physician Assistant [facility] [email or web address]   Bacterial Conjunctivitis, Adult Bacterial conjunctivitis is an infection of the clear membrane that covers the white part of the eye and the inner surface of the eyelid (conjunctiva). When the blood vessels in the conjunctiva become inflamed, the eye becomes red or pink. The eye often feels irritated or itchy. Bacterial conjunctivitis spreads easily from person to person (is contagious). It also spreads easily from one eye to the other eye. What are the causes? This condition is caused by bacteria. You may get the infection if you come into close contact with: A person who is infected with the bacteria. Items that are contaminated with the bacteria, such as a face towel, contact lens solution, or eye makeup. What increases the risk? You are more likely to develop this condition if: You are exposed to other people who have the infection. You wear contact lenses. You have a sinus infection. You have had a recent eye injury or surgery. You have a weak body defense system (immune system). You have a medical condition that causes dry eyes. What are the signs or symptoms? Symptoms of this condition include: Thick, yellowish discharge from the eye. This may turn into a crust on the eyelid overnight and cause your eyelids to stick together. Tearing or watery eyes. Itchy eyes. Burning feeling in your eyes. Eye redness. Swollen eyelids. Blurred vision. How is this diagnosed? This condition is diagnosed based on your symptoms and medical history. Your  health care provider may also take a sample of discharge from your eye to find the cause of your infection. How is this treated? This condition may be treated with: Antibiotic eye drops or ointment to clear the infection more quickly and prevent the spread of infection to others. Antibiotic medicines taken by mouth (orally) to treat infections that do not respond to drops or ointments or that last longer than 10 days. Cool, wet cloths (cool compresses) placed on the eyes. Artificial tears applied 2-6 times a day. Follow these instructions at home: Medicines Take or apply your antibiotic medicine as told by your health care provider. Do not stop using the antibiotic, even if your condition improves, unless directed by your health care provider. Take or apply over-the-counter and prescription medicines only as told by your health care provider. Be very careful to avoid touching the edge of your eyelid with the eye-drop bottle or the ointment tube when you apply medicines to the affected eye. This will keep you from spreading the infection to your other eye or to other people. Managing discomfort Gently wipe away any drainage from your eye with a warm, wet washcloth or a cotton ball. Apply a clean, cool compress to your eye for 10-20 minutes, 3-4 times a day. General instructions Do not wear contact lenses until the inflammation is gone and your health care provider says it is safe to wear them again. Ask your health care provider how to sterilize or replace your contact lenses before you use them again. Wear glasses until you can resume wearing contact lenses. Avoid wearing eye makeup  until the inflammation is gone. Throw away any old eye cosmetics that may be contaminated. Change or wash your pillowcase every day. Do not share towels or washcloths. This may spread the infection. Wash your hands often with soap and water for at least 20 seconds and especially before touching your face or eyes. Use  paper towels to dry your hands. Avoid touching or rubbing your eyes. Do not drive or use heavy machinery if your vision is blurred. Contact a health care provider if: You have a fever. Your symptoms do not get better after 10 days. Get help right away if: You have a fever and your symptoms suddenly get worse. You have severe pain when you move your eye. You have facial pain, redness, or swelling. You have a sudden loss of vision. Summary Bacterial conjunctivitis is an infection of the clear membrane that covers the white part of the eye and the inner surface of the eyelid (conjunctiva). Bacterial conjunctivitis spreads easily from eye to eye and from person to person (is contagious). Wash your hands often with soap and water for at least 20 seconds and especially before touching your face or eyes. Use paper towels to dry your hands. Take or apply your antibiotic medicine as told by your health care provider. Do not stop using the antibiotic even if your condition improves. Contact a health care provider if you have a fever or if your symptoms do not get better after 10 days. Get help right away if you have a sudden loss of vision. This information is not intended to replace advice given to you by your health care provider. Make sure you discuss any questions you have with your health care provider. Document Revised: 03/19/2021 Document Reviewed: 03/19/2021 Elsevier Patient Education  [place].

## 2021-12-19 ENCOUNTER — Emergency Department (HOSPITAL_COMMUNITY): Payer: Medicaid Other

## 2021-12-19 ENCOUNTER — Encounter (HOSPITAL_COMMUNITY): Payer: Self-pay | Admitting: Emergency Medicine

## 2021-12-19 ENCOUNTER — Emergency Department (HOSPITAL_COMMUNITY)
Admission: EM | Admit: 2021-12-19 | Discharge: 2021-12-19 | Disposition: A | Discharge status: Home / Self Care | Payer: Medicaid Other | Attending: Emergency Medicine | Admitting: Emergency Medicine

## 2021-12-19 ENCOUNTER — Other Ambulatory Visit: Payer: Self-pay

## 2021-12-19 DIAGNOSIS — Z91040 Latex allergy status: Secondary | ICD-10-CM | POA: Insufficient documentation

## 2021-12-19 DIAGNOSIS — S0591XA Unspecified injury of right eye and orbit, initial encounter: Secondary | ICD-10-CM | POA: Diagnosis present

## 2021-12-19 DIAGNOSIS — S0511XA Contusion of eyeball and orbital tissues, right eye, initial encounter: Secondary | ICD-10-CM | POA: Insufficient documentation

## 2021-12-19 DIAGNOSIS — Z85118 Personal history of other malignant neoplasm of bronchus and lung: Secondary | ICD-10-CM | POA: Diagnosis not present

## 2021-12-19 DIAGNOSIS — H11421 Conjunctival edema, right eye: Secondary | ICD-10-CM | POA: Insufficient documentation

## 2021-12-19 DIAGNOSIS — Z0389 Encounter for observation for other suspected diseases and conditions ruled out: Secondary | ICD-10-CM | POA: Diagnosis not present

## 2021-12-19 DIAGNOSIS — X58XXXA Exposure to other specified factors, initial encounter: Secondary | ICD-10-CM | POA: Diagnosis not present

## 2021-12-19 DIAGNOSIS — F17210 Nicotine dependence, cigarettes, uncomplicated: Secondary | ICD-10-CM | POA: Diagnosis not present

## 2021-12-19 LAB — BASIC METABOLIC PANEL
Anion gap: 9 (ref 5–15)
BUN: 10 mg/dL (ref 6–20)
CO2: 23 mmol/L (ref 22–32)
Calcium: 9.1 mg/dL (ref 8.9–10.3)
Chloride: 105 mmol/L (ref 98–111)
Creatinine, Ser: 0.64 mg/dL (ref 0.44–1.00)
GFR, Estimated: >60 mL/min (ref >60)
Glucose, Bld: 113 mg/dL — ABNORMAL HIGH (ref 70–99)
Potassium: 3.9 mmol/L (ref 3.5–5.1)
Sodium: 137 mmol/L (ref 135–145)

## 2021-12-19 LAB — CBC
HCT: 42.5 % (ref 36.0–46.0)
Hemoglobin: 14.5 g/dL (ref 12.0–15.0)
MCH: 30.5 pg (ref 26.0–34.0)
MCHC: 34.1 g/dL (ref 30.0–36.0)
MCV: 89.3 fL (ref 80.0–100.0)
Platelets: 245 10*3/uL (ref 150–400)
RBC: 4.76 MIL/uL (ref 3.87–5.11)
RDW: 12.2 % (ref 11.5–15.5)
WBC: 10.2 10*3/uL (ref 4.0–10.5)
nRBC: 0 % (ref 0.0–0.2)

## 2021-12-19 MED ORDER — FLUORESCEIN SODIUM 1 MG OP STRP
1.0000 | ORAL_STRIP | Freq: Once | OPHTHALMIC | Status: DC
  Filled 2021-12-19 (×2): qty 1

## 2021-12-19 MED ORDER — ARTIFICIAL TEARS OPHTHALMIC OINT: TOPICAL_OINTMENT | OPHTHALMIC | 0 refills | Status: DC | PRN

## 2021-12-19 MED ORDER — TETRACAINE HCL 0.5 % OP SOLN
2.0000 [drp] | Freq: Once | OPHTHALMIC | Status: DC
  Filled 2021-12-19 (×2): qty 4

## 2021-12-19 MED ORDER — IOHEXOL 350 MG/ML SOLN
100.0000 mL | Freq: Once | INTRAVENOUS | Status: AC | PRN
  Administered 2021-12-19: 15:00:00 100 mL via INTRAVENOUS

## 2021-12-19 NOTE — ED Provider Notes (Signed)
[facility] EMERGENCY DEPARTMENT Provider Note   CSN: [id number] Arrival date & time: 12/19/21  6195     History Chief Complaint  Patient presents with   Conjunctivitis    Megan Colon is a 45 y.o. female.   Conjunctivitis   Patient presents ED with complaints of right eye pain and irritation.  Patient states she started having symptoms on the 26.  She noticed redness around her eye.  She went to see the eye doctor where she gets her glasses, "my eye doctor" and was given a prescription for antibiotic eyedrops.  Patient states she did not feel like it was getting any better so she saw her primary care doctor and was started on oral doxycycline.  She states it has not gotten any better she denies any fevers or chills.  No change in her vision.  The redness and swelling has increased around her eye and within her eye  Past Medical History:  Diagnosis Date   Allergy    Phreesia 12/09/2020   Anxiety    Phreesia 12/09/2020   Arthritis    GERD (gastroesophageal reflux disease)    Lung cancer ([place])    Father   Migraines    since childhood    Patient Active Problem List   Diagnosis Date Noted   Other allergic rhinitis 05/01/2021   Allergic conjunctivitis of both eyes 05/01/2021   Tobacco user 05/01/2021   Coughing 05/01/2021   Heartburn 05/01/2021   Neck pain, chronic 07/31/2020   Degenerative disc disease, cervical 07/12/2020   Gestational hypertension 05/06/2017    Past Surgical History:  Procedure Laterality Date   CESAREAN SECTION N/A 05/08/2017   Procedure: CESAREAN SECTION;  Surgeon: Florian Buff, MD;  Location: [place];  Service: Obstetrics;  Laterality: N/A;   CESAREAN SECTION N/A    Phreesia 12/09/2020   LASIK Bilateral 2006     OB History     Gravida  6   Para  2   Term  2   Preterm      AB  3   Living  3      SAB      IAB  3   Ectopic      Multiple      Live Births  3           Family History   Problem Relation Age of Onset   Hypertension Mother    Diabetes Father    Asthma Sister    Cancer Neg Hx    Stroke Neg Hx    Angioedema Neg Hx    Allergic rhinitis Neg Hx    Atopy Neg Hx    Eczema Neg Hx    Immunodeficiency Neg Hx    Urticaria Neg Hx     Social History   Tobacco Use   Smoking status: Every Day    Packs/day: 0.25    Types: Cigarettes   Smokeless tobacco: Never  Vaping Use   Vaping Use: Never used  Substance Use Topics   Alcohol use: No   Drug use: No    Home Medications Prior to Admission medications   Medication Sig Start Date End Date Taking? Authorizing Provider  artificial tears (LACRILUBE) OINT ophthalmic ointment Place into both eyes every 4 (four) hours as needed for dry eyes. 12/19/21  Yes Dorie Rank, MD  azelastine (ASTELIN) 0.1 % nasal spray Place 1-2 sprays into both nostrils 2 (two) times daily as needed (runny nose). Use in each nostril  as directed Patient not taking: Reported on 12/15/2021 05/01/21   Garnet Sierras, DO  cromolyn (OPTICROM) 4 % ophthalmic solution Place 1 drop into both eyes 4 (four) times daily as needed (itchy/watery eyes). Patient not taking: Reported on 12/15/2021 05/01/21   Garnet Sierras, DO  doxycycline (VIBRAMYCIN) 100 MG capsule Take 1 capsule (100 mg total) by mouth 2 (two) times daily for 10 days. 12/17/21 12/27/21  Mayers, Cari S, PA-C  fluticasone (FLONASE) 50 MCG/ACT nasal spray Place 1 spray into both nostrils 2 (two) times daily as needed (nasal congestion). Patient not taking: Reported on 12/15/2021 05/01/21   Garnet Sierras, DO  ibuprofen (ADVIL) 600 MG tablet Take 1 tablet (600 mg total) by mouth every 8 (eight) hours as needed. 12/17/21   Mayers, Cari S, PA-C  levocetirizine (XYZAL) 5 MG tablet Take 1 tablet (5 mg total) by mouth every evening. 05/01/21   Garnet Sierras, DO  montelukast (SINGULAIR) 10 MG tablet Take 1 tablet (10 mg total) by mouth at bedtime. Patient not taking: Reported on 12/15/2021 05/01/21   Garnet Sierras,  DO  pantoprazole (PROTONIX) 40 MG tablet Take 1 tablet (40 mg total) by mouth daily. 04/01/21   Camillia Herter, NP  SUMAtriptan (IMITREX) 100 MG tablet Take 1 tablet at the onset of migraine. May repeat in 2 hours if headache persists or recurs. 04/17/20   Ward Givens, NP  TOBRADEX ophthalmic solution Place 1 drop into the right eye 4 (four) times daily. 12/16/21   [provider]  traZODone (DESYREL) 50 MG tablet Take 0.5-1 tablets (25-50 mg total) by mouth at bedtime as needed for sleep. 04/01/21   Camillia Herter, NP    Allergies    Latex  Review of Systems   Review of Systems  All other systems reviewed and are negative.  Physical Exam Updated Vital Signs BP 121/85    Pulse 86    Temp 98.1 F (36.7 C)    Resp 20    Ht 1.524 m (5')    Wt 83 kg    LMP 11/26/2021    SpO2 100%    BMI 35.74 kg/m   Physical Exam Vitals and nursing note reviewed.  Constitutional:      General: She is not in acute distress.    Appearance: She is well-developed. She is not toxic-appearing.  HENT:     Head: Normocephalic and atraumatic.     Right Ear: External ear normal.     Left Ear: External ear normal.     Ears:     Comments: Bruising noted around the eye, ecchymoses of the superior and inferior eyelid, subconjunctival hemorrhage and injection Eyes:     General: No scleral icterus.       Right eye: No discharge.        Left eye: No discharge.     Conjunctiva/sclera: Conjunctivae normal.     Right eye: Chemosis present.  Neck:     Trachea: No tracheal deviation.  Cardiovascular:     Rate and Rhythm: Normal rate.  Pulmonary:     Effort: Pulmonary effort is normal. No respiratory distress.     Breath sounds: No stridor.  Abdominal:     General: There is no distension.  Musculoskeletal:        General: No swelling or deformity.     Cervical back: Neck supple.  Skin:    General: Skin is warm and dry.     Findings: No rash.  Neurological:  Mental Status: She is alert.      Cranial Nerves: Cranial nerve deficit: no gross deficits.    ED Results / Procedures / Treatments   Labs (all labs ordered are listed, but only abnormal results are displayed) Labs Reviewed  BASIC METABOLIC PANEL - Abnormal; Notable for the following components:      Result Value   Glucose, Bld 113 (*)    All other components within normal limits  CBC    EKG None  Radiology CT Orbits W Contrast  Result Date: 12/19/2021 CLINICAL DATA:  Orbital cellulitis suspected EXAM: CT ORBITS WITH CONTRAST TECHNIQUE: Multidetector CT images was performed according to the standard protocol following intravenous contrast administration. CONTRAST:  144mL OMNIPAQUE IOHEXOL 350 MG/ML SOLN COMPARISON:  No pertinent prior exam. FINDINGS: Orbits: There is no infiltration of the postseptal fat. Globes, extraocular muscles, and optic nerve sheath complexes are symmetric and unremarkable. No soft tissue abscess. Visible paranasal sinuses: No significant opacification. Soft tissues: Unremarkable. Osseous: Unremarkable. Limited intracranial: No abnormal enhancement. IMPRESSION: No evidence of postseptal orbital cellulitis. Electronically Signed   By: Macy Mis M.D.   On: 12/19/2021 15:52    Procedures Procedures   Medications Ordered in ED Medications  iohexol (OMNIPAQUE) 350 MG/ML injection 100 mL (100 mLs Intravenous Contrast Given 12/19/21 1506)    ED Course  I have reviewed the triage vital signs and the nursing notes.  Pertinent labs & imaging results that were available during my care of the patient were reviewed by me and considered in my medical decision making (see chart for details).  Clinical Course as of 12/19/21 1615  Fri Dec 19, 2021  1555 CT without cellulitis [JK]    Clinical Course User Index [JK] Dorie Rank, MD   MDM Rules/Calculators/A&P                         Presented to ED with complaints of irritation to her eyes.  Patient has been seen by an eye doctor as well as  primary doctor.  She was started on antibiotics.  She continues to have persistent eye swelling and discomfort.  Findings suggest possible orbital trauma.  Patient has signs of Bero orbital contusion.  Patient however denies any significant injury.  She did hit her eye with a box but that was preceding the symptoms.  No signs of cellulitis on exam.  No signs of fracture.  We will have her continue the antibiotics and I recommend follow-up with an ophthalmologist for further evaluation.    Final Clinical Impression(s) / ED Diagnoses Final diagnoses:  Periorbital contusion of right eye, initial encounter  Chemosis of right conjunctiva    Rx / DC Orders ED Discharge Orders          Ordered    artificial tears (LACRILUBE) OINT ophthalmic ointment  Every 4 hours PRN        12/19/21 1615             Dorie Rank, MD 12/19/21 1615

## 2021-12-19 NOTE — Discharge Instructions (Addendum)
Continue your antibiotics.  Use the eye ointment as needed.  Follow-up with Dr. Nancy Fetter for further evaluation

## 2021-12-19 NOTE — ED Triage Notes (Signed)
Pt reports has been taking medications for conjuctivitis since 12/26. PT reports increased pain, swelling, bruising, reddness to right eye and now spreading to left eye. Pt reports recent visual changes.

## 2021-12-19 NOTE — ED Provider Notes (Signed)
Emergency Medicine Provider Triage Evaluation Note  Kaela Beitz , a 45 y.o. female  was evaluated in triage.  Pt complains of bilateral conjunctivitis.  Patient was taking medications for conjunctivitis since 12/26, and started in her right eye.  She reports increased pain, swelling, bruising, and redness to the right eye.  She was seen at an eye doctor earlier this week who prescribed eyedrops, and her primary doctor prescribed oral doxycycline.  She now has redness and irritation of the left eye.  She reports recent visual changes, none on my exam today.  Review of Systems  Positive: Eye redness and swelling, vision changes Negative: Fever, chills, loss of vision  Physical Exam  BP (!) 139/96 (BP Location: Left Arm)    Pulse 87    Temp 98.4 F (36.9 C) (Oral)    Resp 16    Ht 5' (1.524 m)    Wt 83 kg    LMP 11/26/2021    SpO2 97%    BMI 35.74 kg/m  Gen:   Awake, no distress   Resp:  Normal effort  MSK:   Moves extremities without difficulty  Other:  Significant conjunctival injection, bordering on hemorrhage of the right eye, with significant swelling of the lid and orbital socket, no vision changes at this time  Medical Decision Making  Medically screening exam initiated at 9:08 AM.  Appropriate orders placed.  Kerryn Tennant was informed that the remainder of the evaluation will be completed by another provider, this initial triage assessment does not replace that evaluation, and the importance of remaining in the ED until their evaluation is complete.  Concern for corneal ulceration or orbital cellulitis   Kateri Plummer, PA-C 12/19/21 1937    Regan Lemming, MD 12/19/21 [phone]

## 2021-12-19 NOTE — ED Notes (Signed)
Pt's left eye is ecchymotic. The right eye has darkened area under it. Both sclera are erythematous. Pt states she has yellow drainage that  comes from eyes in the morning. Pt states both eyes are crusted shut in the morning with the yellow drainage.

## 2021-12-19 NOTE — ED Notes (Signed)
Patient transported to CT 

## 2021-12-20 ENCOUNTER — Telehealth: Payer: Self-pay

## 2021-12-20 NOTE — Telephone Encounter (Signed)
Transition Care Management Unsuccessful Follow-up Telephone Call  Date of discharge and from where:  12/19/2021-[facility]   Attempts:  1st Attempt  Reason for unsuccessful TCM follow-up call:  Left voice message

## 2021-12-23 NOTE — Telephone Encounter (Signed)
Transition Care Management Unsuccessful Follow-up Telephone Call  Date of discharge and from where:  12/19/2021-[facility]   Attempts:  2nd Attempt  Reason for unsuccessful TCM follow-up call:  Left voice message

## 2021-12-24 NOTE — Telephone Encounter (Signed)
Transition Care Management Unsuccessful Follow-up Telephone Call  Date of discharge and from where:  12/19/2021-[facility]   Attempts:  3rd Attempt  Reason for unsuccessful TCM follow-up call:  Left voice message

## 2021-12-26 ENCOUNTER — Other Ambulatory Visit: Payer: Self-pay | Admitting: Allergy

## 2022-02-25 DIAGNOSIS — H5213 Myopia, bilateral: Secondary | ICD-10-CM | POA: Diagnosis not present

## 2022-03-19 DIAGNOSIS — H52223 Regular astigmatism, bilateral: Secondary | ICD-10-CM | POA: Diagnosis not present

## 2022-03-19 DIAGNOSIS — H5213 Myopia, bilateral: Secondary | ICD-10-CM | POA: Diagnosis not present

## 2022-05-14 ENCOUNTER — Other Ambulatory Visit: Payer: Self-pay | Admitting: Adult Health

## 2022-06-10 ENCOUNTER — Encounter: Payer: Self-pay | Admitting: Student

## 2022-06-10 ENCOUNTER — Encounter: Payer: Self-pay | Admitting: Obstetrics

## 2022-06-10 ENCOUNTER — Other Ambulatory Visit (HOSPITAL_COMMUNITY)
Admission: RE | Admit: 2022-06-10 | Discharge: 2022-06-10 | Disposition: A | Discharge status: Home / Self Care | Payer: Medicaid Other | Source: Ambulatory Visit | Attending: Student | Admitting: Student

## 2022-06-10 ENCOUNTER — Ambulatory Visit (INDEPENDENT_AMBULATORY_CARE_PROVIDER_SITE_OTHER): Payer: Medicaid Other | Admitting: Student

## 2022-06-10 VITALS — BP 142/89 | HR 87 | Ht 61.0 in | Wt 164.4 lb

## 2022-06-10 DIAGNOSIS — Z01419 Encounter for gynecological examination (general) (routine) without abnormal findings: Secondary | ICD-10-CM

## 2022-06-10 DIAGNOSIS — Z113 Encounter for screening for infections with a predominantly sexual mode of transmission: Secondary | ICD-10-CM | POA: Diagnosis not present

## 2022-06-10 DIAGNOSIS — N393 Stress incontinence (female) (male): Secondary | ICD-10-CM

## 2022-06-10 NOTE — Progress Notes (Signed)
ANNUAL EXAM Patient name: Megan Colon MRN [id number]  Date of birth: [birth year] Chief Complaint:   No chief complaint on file.  History of Present Illness:   Megan Colon is a 46 y.o. [phone]  Latina  female being seen today for a routine annual exam.  Current complaints: Urinary leakage x 5 years. Patient reports that after having last child she has been leaking urine when she coughs, laughs, sneezes or engages in sexual intercourse. The urine leakage is impacting her daily life and ability to engage in intimate relationships. No s/s of UTI today.   No LMP recorded. IUD   The pregnancy intention screening data noted above was reviewed. Potential methods of contraception were discussed. The patient elected to proceed with No data recorded.   Last pap . > 5 years ago. Results were:  unknown . H/O abnormal pap: no Last mammogram: N/A. Results were: N/A. Family h/o breast cancer: no Last colonoscopy: N/A. Results were: N/A. Family h/o colorectal cancer: no     06/10/2022    8:41 AM 12/17/2021    9:37 AM 04/01/2021    3:40 PM 12/09/2020    2:29 PM 07/11/2020    9:51 AM  Depression screen PHQ 2/9  Decreased Interest 0 1 1 0 0  Down, Depressed, Hopeless 0 1 1 0 0  PHQ - 2 Score 0 2 2 0 0  Altered sleeping 2 2 2     Tired, decreased energy 3 3 3     Change in appetite 0 2 2    Feeling bad or failure about yourself  0 1 1    Trouble concentrating 0 2 2    Moving slowly or fidgety/restless 0 2 1    Suicidal thoughts 0 0 0    PHQ-9 Score 5 14 13     Difficult doing work/chores   Somewhat difficult          06/10/2022    8:42 AM 12/17/2021    9:37 AM  GAD 7 : Generalized Anxiety Score  Nervous, Anxious, on Edge 0 2  Control/stop worrying 0 2  Worry too much - different things 0 2  Trouble relaxing 1 2  Restless 0 2  Easily annoyed or irritable 0 2  Afraid - awful might happen 1 2  Total GAD 7 Score 2 14     Review of Systems:   Pertinent items are noted in HPI Denies any  headaches, blurred vision, fatigue, shortness of breath, chest pain, abdominal pain, abnormal vaginal discharge/itching/odor/irritation, problems with periods, bowel movements, urination, or intercourse unless otherwise stated above. Pertinent History Reviewed:  Reviewed past medical,surgical, social and family history.  Reviewed problem list, medications and allergies. Physical Assessment:   Vitals:   06/10/22 0822  BP: (!) 142/89  Pulse: 87  Weight: 164 lb 6.4 oz (74.6 kg)  Height: 5\' 1"  (1.549 m)  Body mass index is 31.06 kg/m.        Physical Examination:   General appearance - well appearing, and in no distress  Mental status - alert, oriented to person, place, and time  Psych:  She has a normal mood and affect  Skin - warm and dry, normal color, no suspicious lesions noted  Chest - effort normal, all lung fields clear to auscultation bilaterally  Heart - normal rate and regular rhythm  Neck:  midline trachea, no thyromegaly or nodules  Breasts - breasts appear normal, no suspicious masses, no skin or nipple changes or  axillary nodes  Abdomen -  soft, nontender, nondistended, no masses or organomegaly  Pelvic - protrusion of vaginal walls beyond introitus during valsalva maneuver VULVA: normal appearing vulva with no masses, tenderness or lesions   VAGINA: normal appearing vagina with normal color and discharge, no lesions   CERVIX: normal appearing cervix without discharge or lesions, no CMT  Thin prep pap is done  HR HPV cotesting  UTERUS: uterus is felt to be normal size, shape, consistency and nontender   ADNEXA: No adnexal masses or tenderness noted.  Rectal - normal rectal, good sphincter tone, no masses felt.   Extremities:  No swelling or varicosities noted  Chaperone present for exam  No results found for this or any previous visit (from the past 24 hour(s)).  Assessment & Plan:  1. Women's annual routine gynecological examination - Pap collected -Mammo  ordered  2. Stress incontinence, female - Amb ref to Urogyn & PT   3. Routine screening for STI (sexually transmitted infection) - blood work and culture collected   Labs/procedures today: see below orders placed  Mammogram: schedule screening mammo as soon as possible, or sooner if problems Colonoscopy: schedule screening colonoscopy as soon as possible, or sooner if problems  Orders Placed This Encounter  Procedures   MM 3D SCREEN BREAST BILATERAL   HepB+HepC+HIV Panel   RPR   Ambulatory referral to Urogynecology   Ambulatory referral to Physical Therapy    Meds: No orders of the defined types were placed in this encounter.   Follow-up: Return in about 3 months (around 09/10/2022) for routine f/u .  Johnston Ebbs, NP 06/10/2022 9:24 AM

## 2022-06-10 NOTE — Progress Notes (Signed)
Reports problems with bladder leakage. Reports problems with sexual intimacy due to weak pelvic floor muscles, feeling "loose". Very emotional.

## 2022-06-11 LAB — CERVICOVAGINAL ANCILLARY ONLY
Bacterial Vaginitis (gardnerella): POSITIVE — AB
Candida Glabrata: NEGATIVE
Candida Vaginitis: NEGATIVE
Chlamydia: NEGATIVE
Comment: NEGATIVE
Comment: NEGATIVE
Comment: NEGATIVE
Comment: NEGATIVE
Comment: NEGATIVE
Comment: NORMAL
Neisseria Gonorrhea: NEGATIVE
Trichomonas: NEGATIVE

## 2022-06-11 LAB — RPR: RPR Ser Ql: NONREACTIVE

## 2022-06-11 LAB — HIV ANTIBODY (ROUTINE TESTING W REFLEX): HIV Screen 4th Generation wRfx: NONREACTIVE

## 2022-06-11 LAB — HEPATITIS C ANTIBODY: Hep C Virus Ab: NONREACTIVE

## 2022-06-11 LAB — HEPATITIS B SURFACE ANTIGEN: Hepatitis B Surface Ag: NEGATIVE

## 2022-06-12 LAB — CYTOLOGY - PAP
Comment: NEGATIVE
Diagnosis: NEGATIVE
High risk HPV: NEGATIVE

## 2022-06-13 ENCOUNTER — Inpatient Hospital Stay (HOSPITAL_BASED_OUTPATIENT_CLINIC_OR_DEPARTMENT_OTHER): Admission: RE | Admit: 2022-06-13 | Payer: Medicaid Other | Source: Ambulatory Visit | Admitting: Radiology

## 2022-06-16 ENCOUNTER — Other Ambulatory Visit: Payer: Self-pay | Admitting: Student

## 2022-06-16 DIAGNOSIS — B9689 Other specified bacterial agents as the cause of diseases classified elsewhere: Secondary | ICD-10-CM

## 2022-06-16 MED ORDER — METRONIDAZOLE 500 MG PO TABS
500.0000 mg | ORAL_TABLET | Freq: Two times a day (BID) | ORAL | 0 refills | Status: AC
Start: 2022-06-16 — End: ?

## 2022-06-22 ENCOUNTER — Encounter: Payer: Self-pay | Admitting: Physical Therapy

## 2022-06-22 ENCOUNTER — Ambulatory Visit: Payer: Medicaid Other | Attending: Student | Admitting: Physical Therapy

## 2022-06-22 DIAGNOSIS — N393 Stress incontinence (female) (male): Secondary | ICD-10-CM | POA: Diagnosis not present

## 2022-06-22 DIAGNOSIS — M6281 Muscle weakness (generalized): Secondary | ICD-10-CM | POA: Diagnosis not present

## 2022-06-22 NOTE — Therapy (Signed)
OUTPATIENT PHYSICAL THERAPY FEMALE PELVIC EVALUATION   Patient Name: Megan Colon MRN: [id number] DOB:Nov 17, 1976, 46 y.o., female Today's Date: 06/22/2022   PT End of Session - 06/22/22 1210     Visit Number 1    Date for PT Re-Evaluation 09/14/22    Authorization Type healthy blue    PT Start Time 1110    PT Stop Time 1145    PT Time Calculation (min) 35 min    Activity Tolerance Patient tolerated treatment well    Behavior During Therapy [facility] for tasks assessed/performed             Past Medical History:  Diagnosis Date   Allergy    Phreesia 12/09/2020   Anxiety    Phreesia 12/09/2020   Arthritis    GERD (gastroesophageal reflux disease)    Lung cancer ([place])    Father   Migraines    since childhood   Past Surgical History:  Procedure Laterality Date   CESAREAN SECTION N/A 05/08/2017   Procedure: CESAREAN SECTION;  Surgeon: Florian Buff, MD;  Location: [facility];  Service: Obstetrics;  Laterality: N/A;   CESAREAN SECTION N/A    Phreesia 12/09/2020   LASIK Bilateral 2006   Patient Active Problem List   Diagnosis Date Noted   Other allergic rhinitis 05/01/2021   Allergic conjunctivitis of both eyes 05/01/2021   Tobacco user 05/01/2021   Coughing 05/01/2021   Heartburn 05/01/2021   Neck pain, chronic 07/31/2020   Degenerative disc disease, cervical 07/12/2020   Gestational hypertension 05/06/2017    PCP: Camillia Herter, NP  REFERRING PROVIDER: Johnston Ebbs, NP  REFERRING DIAG: N39.3 (ICD-10-CM) - Stress incontinence, female  THERAPY DIAG:  Muscle weakness (generalized)  Rationale for Evaluation and Treatment Rehabilitation  ONSET DATE: 19 years ago  SUBJECTIVE:                                                                                                                                                                                           SUBJECTIVE STATEMENT: Patient will leak urine when she does not want to. She has not sexual  pleasure.  Fluid intake: Yes: water and cut down on soda, coffee     PAIN:  Are you having pain? Yes NPRS scale: 7/10 Pain location:  right lower quadrant  Pain type: shooting, achy Pain description: intermittent   Aggravating factors: random Relieving factors: not sure  PRECAUTIONS: None  WEIGHT BEARING RESTRICTIONS No  FALLS:  Has patient fallen in last 6 months? No  LIVING ENVIRONMENT: Lives with: lives with their family  OCCUPATION: sitting, standing  PLOF: Independent  PATIENT GOALS increase  strength, reduce leakage, better confident life  PERTINENT HISTORY:  C-section  x 1   BOWEL MOVEMENT Pain with bowel movement: Yes, if she pushes too hard increases rectal pain Type of bowel movement:Type (Bristol Stool Scale) Type 3, Frequency daily, and Strain Yes, during the day can go 4 times Fully empty rectum: No Leakage: No Pads: No Fiber supplement: No  URINATION Pain with urination: Yes Fully empty bladder: Yes: sometimes feels pain and has to force herself to urinate Stream: Strong Urgency: Yes:   Frequency: every 20 min during the day; night 1 x Leakage: Urge to void, Walking to the bathroom, Coughing, Sneezing, Laughing, and Intercourse Pads: Yes: rolls up paper  INTERCOURSE Pain with intercourse:  none Ability to have vaginal penetration:  Yes: partner does not feels any tension Climax: only when she pleasures herself Marinoff Scale: 0/3  PREGNANCY Vaginal deliveries 2 Tearing Yes: stitches, other pregnancy the doctor placed his hand in the vagina to rotate the baby C-section deliveries 1 Currently pregnant No  PROLAPSE MD has noted prolapse coming out of the introitus   OBJECTIVE:   DIAGNOSTIC FINDINGS:  None  PATIENT SURVEYS:  PFIQ-7 83; UIQ-7 86  COGNITION:  Overall cognitive status: Within functional limits for tasks assessed     SENSATION:  Light touch: Appears intact  Proprioception: Appears intact   GAIT: Distance  walked: none Assistive device utilized: None Level of assistance: Complete Independence Comments: none               POSTURE: No Significant postural limitations   PELVIC ALIGNMENT:  LUMBARAROM/PROM;  Lumbar ROM is full   LOWER EXTREMITY ROM: full hip ROM   LOWER EXTREMITY MMT:  MMT Right eval Left eval  Hip extension 4/5 4/5  Hip abduction 3+/5 4/5  Hip adduction  4/5    PALPATION:   General  Tenderness located in right lower quadrant, along her c-section scar and decreased movement of C-section scar. Difficulty with tightening her abdomen.                 External Perineal Exam Good skin coloring of the perineal area                             Internal Pelvic Floor No tenderness but trounble feeling on the anterior wall   Patient confirms identification and approves PT to assess internal pelvic floor and treatment Yes  PELVIC MMT:   MMT eval  Vaginal 2/5 with most posteriorly and not hug of therapist finger  (Blank rows = not tested)        TONE: Good  PROLAPSE: Anterior wall weakness that will go to the introitus  TODAY'S TREATMENT  EVAL just did the eval   PATIENT EDUCATION:  Education details: none today   HOME EXERCISE PROGRAM: None today  ASSESSMENT:  CLINICAL IMPRESSION: Patient is a 46 y.o. female who was seen today for physical therapy evaluation and treatment for stress incontinence. Patient reports she has had urinary leakage for 19 years after she had her daughter. She reports the birth caused damage to her pelvic floor and since then difficulty with tone during intercourse and urinary leakage. Left lower quadrant pain is 7/10 that comes on randomly.  Pelvic floor strength is 2/5 with most contraction posteriorly and no hug of therapist finger. Patient has anterior wall weakness that goes to the introitus when she bulges the pelvic floor. She does not have pain in the  pelvic floor muscles but has difficulty with feeling the therapist palpating  the pelvic floor. Patient will leak urine with urge to void, walking to the bathroom, coughing, sneezing, laughing, and Intercourse. Patient does not wear a pad but will roll up tissue every time she goes to the bathroom due to the leakage. She will have pain when she urinates and trying to push the rest of the urine out. Patient has to urinate every 20 minutes. Patient has to strain to have a bowel movement and when she does she will have pain. Patient has tenderness located along the left quadrant and suprapubically. She has restrictions of the c-section scar. Patient will benefit from skilled therapy to improve pelvic floor coordination and strength to reduce her urinary leakage and improve toileting.    OBJECTIVE IMPAIRMENTS decreased activity tolerance, decreased coordination, decreased strength, increased fascial restrictions, and pain.   ACTIVITY LIMITATIONS bending, continence, and toileting  PARTICIPATION LIMITATIONS: interpersonal relationship, driving, community activity, and occupation  PERSONAL FACTORS Age, Fitness, and 1 comorbidity: c-section x 1  are also affecting patient's functional outcome.   REHAB POTENTIAL: Excellent  CLINICAL DECISION MAKING: Evolving/moderate complexity  EVALUATION COMPLEXITY: Moderate   GOALS: Goals reviewed with patient? Yes  SHORT TERM GOALS: Target date: 07/20/2022  Patient understands what vaginal wall weakness anteriorly and how to manage it.  Baseline:Not educated yet Goal status: INITIAL  2.  Patient understands how to perform a pelvic floor contraction circularly Baseline: more contraction anteriorly and posteriorly Goal status: INITIAL   LONG TERM GOALS: Target date: 09/14/2022   Patient independent with advanced HEP for core and pelvic floor strength.  Baseline:  Goal status: INITIAL  2.  Pelvic floor strength is >/= 3/5 with a circular contraction and good lift to reduce her urinary leakage Baseline:  Goal status:  INITIAL  3.  Patient understands how to manage her prolapse and reduce pressure on the pelvic floor.  Baseline: Not educated yet Goal status: INITIAL  4.  Patient understands correct toileting techniques to reduce strain on her pelvic floor.  Baseline: not educated yet Goal status: INITIAL  5.  Patient reports her urinary leakage decreased >/= 60% due to increased pelvic floor strength and use 1/2 the amount of the rolled up paper to protect herself from the leakage.  Baseline:has to place rolled up paper into the vaginal area every time she urinates due to the leakage.  Goal status: INITIAL  6.  Patient is able to wait every 2 hours to urinate due to reduction of urge to void.  Baseline: She urinates every 20 minutes.  Goal status: INITIAL  PLAN: PT FREQUENCY: 1x/week  PT DURATION: 12 weeks  PLANNED INTERVENTIONS: Therapeutic exercises, Therapeutic activity, Neuromuscular re-education, Patient/Family education, Joint mobilization, scar mobilization, and Manual therapy  PLAN FOR NEXT SESSION: work on pelvic floor contraction, education on management of the prolapse, lower abdominal contraction, urge to void   Earlie Counts, PT 06/22/22 12:12 PM

## 2022-06-22 NOTE — Patient Instructions (Signed)
About Cystocele Overview The pelvic organs, including the bladder, are normally supported by pelvic floor muscles and ligaments.   When these muscles and ligaments are stretched, weakened or torn, the wall between the bladder and the vagina sags or herniates causing a prolapse, sometimes called a cystocele. This condition may cause discomfort and problems with emptying the bladder.  It can be present in various stages.  Some people are not aware of the changes. Others may notice changes at the vaginal opening or a feeling of the bladder dropping outside the body. Causes of a Cystocele A cystocele is usually caused by muscle straining or stretching during childbirth.  In addition, cystocele is more common after menopause, because the hormone estrogen helps keep the elastic tissues around the pelvic organs strong.  A cystocele is more likely to occur when levels of estrogen decrease. Other causes include: heavy lifting, chronic coughing, previous pelvic surgery and obesity.  Symptoms A bladder that has dropped from its normal position may cause: unwanted urine leakage (stress incontinence), frequent urination or urge to urinate, incomplete emptying of the bladder (not feeling bladder relief after emptying), pain or discomfort in the vagina, pelvis, groin, lower back or lower abdomen and frequent urinary tract infections.  Mild cases may not cause any symptoms.  Treatment Options Pelvic floor (Kegel) exercises: Strength training the muscles in your genital area  Behavioral changes: Treating and preventing constipation, taking time to empty your bladder properly, learning to lift properly and/or  avoid heavy lifting when possible, stopping smoking, avoiding weight gain and treating a chronic cough or bronchitis. A pessary: A vaginal support device is sometimes used to help pelvic support caused by muscle and ligament changes. Surgery: Aurgical repair may be necessary if symptoms cannot be managed with  exercise, behavioral changes and a pessary. Surgery is usually considered for severe cases.  About Pelvic Support Problems Pelvic Support Problems Explained Ligaments, muscles, and connective tissue normally hold your bladder, uterus, and other organs in their proper places in your pelvis. When these tissues become weak, a problem with pelvic support may result. Weak support can cause one or more of the pelvic organs to drop down into the vagina. An organ may even drop so far that is partially exposed outside the body.  Pelvic support problems are named by the change in the organ. The main types of pelvic support problems are:  Cystocele: When the bladder drops down into your vagina.  Enterocele: When your small intestine drops between your vagina and rectum.  Rectocele: When your rectum bulges into the vaginal wall.  Uterine prolapse: When your uterus drops into your vagina.  Vaginal prolapse: When the top part of the vagina begins to droop. This sometimes happens after a hysterectomy (removal of the uterus).  Causes Pelvic support problems can be caused by many conditions. They may begin after you give birth, especially if you had a large baby. During childbirth, the muscles and skin of the birth canal (vagina) are stretched and sometimes torn. They heal over time but are not always exactly the same. A long pushing stage of labor may also weaken these tissues as well as very rapid births as the tissues do not have time to stretch so they tear.  Also, after menopause, there are changes in the vaginal walls resulting from a decrease in estrogen. Estrogen helps to keep the tissues toned. Low levels of estrogen weaken the vaginal walls and may cause the bladder to shift from its normal position. As women get  older, the loss of muscle tone and the relaxation of muscles may cause the uterus or other organs to drop.  Over time, conditions like chronic coughing, chronic constipation, doing a lot of heavy  lifting, straining to pass stool, and obesity, can also weaken the pelvic support muscles.  Diagnosing Pelvic Support Problems Your health care provider will ask about your symptoms and do a pelvic examination. Your provider may also do a rectal exam during your pelvic exam. Your provider may ask you to: 1. Bear down and push (like you are having a bowel movement) so he or she can see if your bladder or other part of your body protrudes into the vagina. 2. Contract the muscles of your pelvis to check the strength of your pelvic muscles.  3. Do several types of urine, nerve and muscle tests of the pelvis and around the bladder to see what type of treatment is best for you.   Symptoms Symptoms of pelvic support problems depend on the organ involved, but may include:  urine leakage  stain or fecal loss after a bowel movement trouble having bowel movements  ache in the lower abdomen, groin, or lower back  bladder infection  a feeling of heaviness, pulling, or fullness in the pelvis, or a feeling that something is falling out of the vagina  an organ protruding from your vaginal opening  feeling the need to support the organs or perineal area to empty bladder or bowels painful sexual intercourse.  Many women feel pelvic pressure or trouble holding their urine immediately after childbirth. For some, these symptoms go away permanently, in others they return as they get older.  Treatment Options A prolapsed organ cannot repair itself. Contact your health care provider as soon as you notice symptoms of a problem. Treatment depends on what the specific problem is and how far advanced it is.  The symptoms caused by some pelvic support problems may simply be treated with changes in diet, medicine to soften the stool, weight loss, or avoiding strenuous activities. You may also do pelvic floor exercises to help strengthen your pelvic muscles.  Some cases of prolapse may require a special support device made  from plastic or rubber called a pessary that fits into the vagina to support the uterus, vagina, or bladder. A pessary can also help women who leak urine when coughing, straining, or exercising. In mild cases, a tampon or vaginal diaphragm may be used instead of a pessary.  Talk to your doctor or health care provider about these options. In serious cases, surgery may be needed to put the organs back into their proper place. The uterus may be removed because of the pressure it puts on the bladder.  Your doctor will know what surgery will be best for you. How can I prevent pelvic support problems?  You can help prevent pelvic support problems by:  maintaining a healthy lifestyle  continuing to do pelvic floor exercises after you deliver a baby  maintaining a healthy weight  avoiding a lot of heavy lifting and lifting with your legs (not from your waist)  treating constipation and avoid getting   [facility] [address] [place],  [address] Phone # [phone] Fax [phone]

## 2022-07-08 ENCOUNTER — Ambulatory Visit: Payer: Medicaid Other | Admitting: Physical Therapy

## 2022-07-08 ENCOUNTER — Encounter: Payer: Self-pay | Admitting: Physical Therapy

## 2022-07-08 DIAGNOSIS — R293 Abnormal posture: Secondary | ICD-10-CM

## 2022-07-08 DIAGNOSIS — M6281 Muscle weakness (generalized): Secondary | ICD-10-CM | POA: Diagnosis not present

## 2022-07-08 DIAGNOSIS — N393 Stress incontinence (female) (male): Secondary | ICD-10-CM | POA: Diagnosis not present

## 2022-07-08 NOTE — Therapy (Addendum)
OUTPATIENT PHYSICAL THERAPY TREATMENT NOTE   Patient Name: Megan Colon MRN: [id number] DOB:[birth year], 46 y.o., female Today's Date: 07/08/2022  PCP: Camillia Herter, NP REFERRING PROVIDER: Johnston Ebbs, NP  END OF SESSION:   PT End of Session - 07/08/22 1401     Visit Number 2    Date for PT Re-Evaluation 09/14/22    Authorization Type healthy blue    Authorization Time Period 06/22/2022-08/20/2022    Authorization - Number of Visits 2    Progress Note Due on Visit 5    PT Start Time 1400    PT Stop Time 1440    PT Time Calculation (min) 40 min    Activity Tolerance Patient tolerated treatment well    Behavior During Therapy [facility] for tasks assessed/performed             Past Medical History:  Diagnosis Date   Allergy    Phreesia 12/09/2020   Anxiety    Phreesia 12/09/2020   Arthritis    GERD (gastroesophageal reflux disease)    Lung cancer ([place])    Father   Migraines    since childhood   Past Surgical History:  Procedure Laterality Date   CESAREAN SECTION N/A 05/08/2017   Procedure: CESAREAN SECTION;  Surgeon: Florian Buff, MD;  Location: [place];  Service: Obstetrics;  Laterality: N/A;   CESAREAN SECTION N/A    Phreesia 12/09/2020   LASIK Bilateral 2006   Patient Active Problem List   Diagnosis Date Noted   Other allergic rhinitis 05/01/2021   Allergic conjunctivitis of both eyes 05/01/2021   Tobacco user 05/01/2021   Coughing 05/01/2021   Heartburn 05/01/2021   Neck pain, chronic 07/31/2020   Degenerative disc disease, cervical 07/12/2020   Gestational hypertension 05/06/2017   REFERRING PROVIDER: Johnston Ebbs, NP   REFERRING DIAG: N39.3 (ICD-10-CM) - Stress incontinence, female   THERAPY DIAG:  Muscle weakness (generalized)   Rationale for Evaluation and Treatment Rehabilitation   ONSET DATE: 19 years ago   SUBJECTIVE:                                                                                                                                                                                             SUBJECTIVE STATEMENT: Excited to start the process for PT.  Fluid intake: Yes: water and cut down on soda, coffee       PAIN:  Are you having pain? Yes NPRS scale: 7/10 Pain location:  right lower quadrant   Pain type: shooting, achy Pain description: intermittent    Aggravating factors: random Relieving factors: not sure   PRECAUTIONS: None  WEIGHT BEARING RESTRICTIONS No   FALLS:  Has patient fallen in last 6 months? No   LIVING ENVIRONMENT: Lives with: lives with their family   OCCUPATION: sitting, standing   PLOF: Independent   PATIENT GOALS increase strength, reduce leakage, better confident life   PERTINENT HISTORY:  C-section  x 1     BOWEL MOVEMENT Pain with bowel movement: Yes, if she pushes too hard increases rectal pain Type of bowel movement:Type (Bristol Stool Scale) Type 3, Frequency daily, and Strain Yes, during the day can go 4 times Fully empty rectum: No Leakage: No Pads: No Fiber supplement: No   URINATION Pain with urination: Yes Fully empty bladder: Yes: sometimes feels pain and has to force herself to urinate Stream: Strong Urgency: Yes:   Frequency: every 20 min during the day; night 1 x Leakage: Urge to void, Walking to the bathroom, Coughing, Sneezing, Laughing, and Intercourse Pads: Yes: rolls up paper   INTERCOURSE Pain with intercourse:  none Ability to have vaginal penetration:  Yes: partner does not feels any tension Climax: only when she pleasures herself Marinoff Scale: 0/3   PREGNANCY Vaginal deliveries 2 Tearing Yes: stitches, other pregnancy the doctor placed his hand in the vagina to rotate the baby C-section deliveries 1 Currently pregnant No   PROLAPSE MD has noted prolapse coming out of the introitus     OBJECTIVE:    DIAGNOSTIC FINDINGS:  None   PATIENT SURVEYS:  PFIQ-7 83; UIQ-7 86   COGNITION:            Overall  cognitive status: Within functional limits for tasks assessed                          SENSATION:            Light touch: Appears intact            Proprioception: Appears intact     GAIT: Distance walked: none Assistive device utilized: None Level of assistance: Complete Independence Comments: none                POSTURE: No Significant postural limitations               PELVIC ALIGNMENT:   LUMBARAROM/PROM;  Lumbar ROM is full     LOWER EXTREMITY ROM: full hip ROM     LOWER EXTREMITY MMT:   MMT Right eval Left eval  Hip extension 4/5 4/5  Hip abduction 3+/5 4/5  Hip adduction   4/5     PALPATION:   General  Tenderness located in right lower quadrant, along her c-section scar and decreased movement of C-section scar. Difficulty with tightening her abdomen.                  External Perineal Exam Good skin coloring of the perineal area                             Internal Pelvic Floor No tenderness but trounble feeling on the anterior wall    Patient confirms identification and approves PT to assess internal pelvic floor and treatment Yes   PELVIC MMT:   MMT eval  Vaginal 2/5 with most posteriorly and not hug of therapist finger  (Blank rows = not tested)         TONE: Good   PROLAPSE: Anterior wall weakness that will go to the introitus   TODAY'S  TREATMENT  07/08/2022 Manual: Soft tissue mobilization:circular massage to abdomen; manual work to the diaphragm, manual work to the suprapubic area Scar tissue mobilization:to the lower abdomen and educated patient on how to massage the scar at home Myofascial release:tissue rolling of the abdomen, fascial release along the lower abdomen, release of the pubovesical ligament, release of the mesenteric root Neuromuscular re-education: Core retraining: bridge with ball squeeze Transverse abdominus contraction with ball squeeze Supine hip flexion isometrics hold 5 sec 5x each  side Exercises: Stretches/mobility:piriformis sitting hold 30 sec each side Sitting hamstring stretch hold 30 sec each side Happy baby stretch holding 30 sec Standing hip flexor stretch holding 30 sec each side     PATIENT EDUCATION: 07/08/2022 Education details: Access Code: [email or web address], scar massage Person educated: Patient Education method: Explanation, Demonstration, Tactile cues, Verbal cues, and Handouts Education comprehension: verbalized understanding, returned demonstration, verbal cues required, tactile cues required, and needs further education      HOME EXERCISE PROGRAM: 07/08/2022 Access Code: [email or web address] URL: [email or web address] Date: 07/08/2022 Prepared by: Earlie Counts  Exercises - Seated Piriformis Stretch with Trunk Bend  - 1 x daily - 7 x weekly - 1 sets - 2 reps - 30 sec hold - Seated Hamstring Stretch  - 1 x daily - 7 x weekly - 1 sets - 2 reps - 30 sec hold - Seated Happy Baby With Trunk Flexion For Pelvic Relaxation  - 1 x daily - 7 x weekly - 1 sets - 1 reps - 30 sec hold - Standing Hip Flexor Stretch  - 1 x daily - 7 x weekly - 1 sets - 2 reps - 30 sec13 hold - Hooklying Transversus Abdominis Palpation  - 1 x daily - 7 x weekly - 1 sets - 10 reps - Hooklying Isometric Hip Flexion  - 1 x daily - 7 x weekly - 1 sets - 10 reps - 5 sec hold - Supine Bridge with Mini Swiss Ball Between Knees  - 1 x daily - 7 x weekly - 1 sets - 10 reps   ASSESSMENT:   CLINICAL IMPRESSION: Patient is a 46 y.o. female who was seen today for physical therapy  treatment for stress incontinence. Patient abdomen was softer after therapy. She was able to feel the lower abdominal and pelvic floor muscles to contract for the first time. Patient understands how to perform scar massage.  Patient will benefit from skilled therapy to improve pelvic floor coordination and strength to reduce her urinary leakage and improve toileting.      OBJECTIVE IMPAIRMENTS decreased activity  tolerance, decreased coordination, decreased strength, increased fascial restrictions, and pain.    ACTIVITY LIMITATIONS bending, continence, and toileting   PARTICIPATION LIMITATIONS: interpersonal relationship, driving, community activity, and occupation   PERSONAL FACTORS Age, Fitness, and 1 comorbidity: c-section x 1  are also affecting patient's functional outcome.    REHAB POTENTIAL: Excellent   CLINICAL DECISION MAKING: Evolving/moderate complexity   EVALUATION COMPLEXITY: Moderate     GOALS: Goals reviewed with patient? Yes   SHORT TERM GOALS: Target date: 07/20/2022   Patient understands what vaginal wall weakness anteriorly and how to manage it.  Baseline:Not educated yet Goal status: INITIAL   2.  Patient understands how to perform a pelvic floor contraction circularly Baseline: more contraction anteriorly and posteriorly Goal status: INITIAL     LONG TERM GOALS: Target date: 09/14/2022    Patient independent with advanced HEP for core and pelvic floor strength.  Baseline:  Goal status: INITIAL  2.  Pelvic floor strength is >/= 3/5 with a circular contraction and good lift to reduce her urinary leakage Baseline:  Goal status: INITIAL   3.  Patient understands how to manage her prolapse and reduce pressure on the pelvic floor.  Baseline: Not educated yet Goal status: INITIAL   4.  Patient understands correct toileting techniques to reduce strain on her pelvic floor.  Baseline: not educated yet Goal status: INITIAL   5.  Patient reports her urinary leakage decreased >/= 60% due to increased pelvic floor strength and use 1/2 the amount of the rolled up paper to protect herself from the leakage.  Baseline:has to place rolled up paper into the vaginal area every time she urinates due to the leakage.  Goal status: INITIAL   6.  Patient is able to wait every 2 hours to urinate due to reduction of urge to void.  Baseline: She urinates every 20 minutes.  Goal  status: INITIAL   PLAN: PT FREQUENCY: 1x/week   PT DURATION: 12 weeks   PLANNED INTERVENTIONS: Therapeutic exercises, Therapeutic activity, Neuromuscular re-education, Patient/Family education, Joint mobilization, scar mobilization, and Manual therapy   PLAN FOR NEXT SESSION: work on pelvic floor contraction contraction with internal work, education on management of the prolapse, lower abdominal contraction, urge to void      Earlie Counts, PT 07/08/22 2:46 PM   PHYSICAL THERAPY DISCHARGE SUMMARY  Visits from Start of Care: 2  Current functional level related to goals / functional outcomes: See above. Patient did not return to be reassessed.    Remaining deficits: See above.    Education / Equipment: HEP   Patient agrees to discharge. Patient goals were not met. Patient is being discharged due to not returning since the last visit. Thank you for the referral. Earlie Counts, PT 09/23/22 8:31 AM

## 2022-07-29 ENCOUNTER — Encounter (INDEPENDENT_AMBULATORY_CARE_PROVIDER_SITE_OTHER): Payer: Self-pay

## 2022-07-29 ENCOUNTER — Ambulatory Visit: Payer: Medicaid Other | Attending: Student | Admitting: Physical Therapy

## 2022-07-29 DIAGNOSIS — M6281 Muscle weakness (generalized): Secondary | ICD-10-CM | POA: Insufficient documentation

## 2022-07-29 DIAGNOSIS — N393 Stress incontinence (female) (male): Secondary | ICD-10-CM | POA: Insufficient documentation

## 2022-08-05 ENCOUNTER — Ambulatory Visit: Payer: Medicaid Other | Admitting: Physical Therapy

## 2022-08-12 ENCOUNTER — Encounter: Payer: Medicaid Other | Admitting: Physical Therapy

## 2022-08-19 ENCOUNTER — Ambulatory Visit: Payer: Medicaid Other | Admitting: Physical Therapy

## 2022-08-26 ENCOUNTER — Encounter: Payer: Medicaid Other | Admitting: Physical Therapy

## 2022-09-04 ENCOUNTER — Encounter: Payer: Medicaid Other | Admitting: Physical Therapy

## 2022-09-09 ENCOUNTER — Encounter: Payer: Medicaid Other | Admitting: Physical Therapy

## 2022-12-07 ENCOUNTER — Ambulatory Visit (HOSPITAL_COMMUNITY)
Admission: RE | Admit: 2022-12-07 | Discharge: 2022-12-07 | Disposition: A | Discharge status: Home / Self Care | Payer: Medicaid Other | Source: Ambulatory Visit | Attending: Physician Assistant | Admitting: Physician Assistant

## 2022-12-07 ENCOUNTER — Encounter (HOSPITAL_COMMUNITY): Payer: Self-pay

## 2022-12-07 ENCOUNTER — Telehealth (HOSPITAL_COMMUNITY): Payer: Self-pay | Admitting: Emergency Medicine

## 2022-12-07 VITALS — BP 141/86 | HR 92 | Temp 99.6°F | Resp 18

## 2022-12-07 DIAGNOSIS — Z1152 Encounter for screening for COVID-19: Secondary | ICD-10-CM | POA: Insufficient documentation

## 2022-12-07 DIAGNOSIS — J069 Acute upper respiratory infection, unspecified: Secondary | ICD-10-CM | POA: Diagnosis not present

## 2022-12-07 DIAGNOSIS — Z79899 Other long term (current) drug therapy: Secondary | ICD-10-CM | POA: Insufficient documentation

## 2022-12-07 DIAGNOSIS — R110 Nausea: Secondary | ICD-10-CM | POA: Insufficient documentation

## 2022-12-07 LAB — RESP PANEL BY RT-PCR (FLU A&B, COVID) ARPGX2
Influenza A by PCR: NEGATIVE
Influenza B by PCR: NEGATIVE
SARS Coronavirus 2 by RT PCR: NEGATIVE

## 2022-12-07 MED ORDER — PROMETHAZINE-DM 6.25-15 MG/5ML PO SYRP: 5.0000 mL | ORAL_SOLUTION | Freq: Four times a day (QID) | ORAL | 0 refills | Status: DC | PRN

## 2022-12-07 MED ORDER — ONDANSETRON HCL 4 MG PO TABS: 4.0000 mg | ORAL_TABLET | Freq: Three times a day (TID) | ORAL | 0 refills | Status: DC | PRN

## 2022-12-07 NOTE — ED Triage Notes (Signed)
Pt reports a cough, dizziness, sore throat, body aches, nausea and 1 episode of emesis. States symptoms started Friday.  Has been taking Ibuprofen for relief.

## 2022-12-07 NOTE — ED Provider Notes (Addendum)
Megan Colon    CSN: [id number] Arrival date & time: 12/07/22  1745      History   Chief Complaint Chief Complaint  Patient presents with   Cough    Having flu symptoms since Friday the 15th. Upset stomach, headache, cough, nausea, fatigue, dizziness. - Entered by patient   Nausea   Headache   Dizziness   Generalized Body Aches    HPI Megan Colon is a 46 y.o. female.   Patient complains of cough cough, sore throat, body aches that started 4 days ago.  She reports nausea and 1 episode of vomiting.  She has been taking ibuprofen with minimal relief.  Reports cough is worse at night.  Denies shortness of breath, wheezing, abdominal pain, diarrhea.  She reports known COVID exposure at work    Past Medical History:  Diagnosis Date   Allergy    Phreesia 12/09/2020   Anxiety    Phreesia 12/09/2020   Arthritis    GERD (gastroesophageal reflux disease)    Lung cancer ([place])    Father   Migraines    since childhood    Patient Active Problem List   Diagnosis Date Noted   Other allergic rhinitis 05/01/2021   Allergic conjunctivitis of both eyes 05/01/2021   Tobacco user 05/01/2021   Coughing 05/01/2021   Heartburn 05/01/2021   Neck pain, chronic 07/31/2020   Degenerative disc disease, cervical 07/12/2020   Gestational hypertension 05/06/2017    Past Surgical History:  Procedure Laterality Date   CESAREAN SECTION N/A 05/08/2017   Procedure: CESAREAN SECTION;  Surgeon: Florian Buff, MD;  Location: [place];  Service: Obstetrics;  Laterality: N/A;   CESAREAN SECTION N/A    Phreesia 12/09/2020   LASIK Bilateral 2006    OB History     Gravida  6   Para  2   Term  2   Preterm      AB  3   Living  3      SAB      IAB  3   Ectopic      Multiple      Live Births  3            Home Medications    Prior to Admission medications   Medication Sig Start Date End Date Taking? Authorizing Provider   promethazine-dextromethorphan (PROMETHAZINE-DM) 6.25-15 MG/5ML syrup Take 5 mLs by mouth 4 (four) times daily as needed for cough. 12/07/22  Yes Ward, Lenise Arena, PA-C  artificial tears (LACRILUBE) OINT ophthalmic ointment Place into both eyes every 4 (four) hours as needed for dry eyes. 12/19/21   Dorie Rank, MD  azelastine (ASTELIN) 0.1 % nasal spray Place 1-2 sprays into both nostrils 2 (two) times daily as needed (runny nose). Use in each nostril as directed 05/01/21   Garnet Sierras, DO  cromolyn (OPTICROM) 4 % ophthalmic solution Place 1 drop into both eyes 4 (four) times daily as needed (itchy/watery eyes). 05/01/21   Garnet Sierras, DO  fluticasone (FLONASE) 50 MCG/ACT nasal spray Place 1 spray into both nostrils 2 (two) times daily as needed (nasal congestion). 05/01/21   Garnet Sierras, DO  ibuprofen (ADVIL) 600 MG tablet Take 1 tablet (600 mg total) by mouth every 8 (eight) hours as needed. 12/17/21   Mayers, Cari S, PA-C  levocetirizine (XYZAL) 5 MG tablet TAKE 1 TABLET(5 MG) BY MOUTH EVERY EVENING 12/26/21   Garnet Sierras, DO  metroNIDAZOLE (FLAGYL) 500 MG tablet Take  1 tablet (500 mg total) by mouth 2 (two) times daily. 06/16/22   Johnston Ebbs, NP  montelukast (SINGULAIR) 10 MG tablet Take 1 tablet (10 mg total) by mouth at bedtime. 05/01/21   Garnet Sierras, DO  pantoprazole (PROTONIX) 40 MG tablet Take 1 tablet (40 mg total) by mouth daily. 04/01/21   Camillia Herter, NP  SUMAtriptan (IMITREX) 100 MG tablet Take 1 tablet at the onset of migraine. May repeat in 2 hours if headache persists or recurs. Patient not taking: Reported on 06/10/2022 04/17/20   Ward Givens, NP  [facility] ophthalmic solution Place 1 drop into the right eye 4 (four) times daily. 12/16/21   [provider]  traZODone (DESYREL) 50 MG tablet Take 0.5-1 tablets (25-50 mg total) by mouth at bedtime as needed for sleep. Patient not taking: Reported on 06/10/2022 04/01/21   Camillia Herter, NP    Family History Family History   Problem Relation Age of Onset   Diabetes Father    Hypertension Mother    Ovarian cancer Brother    Asthma Sister    Cancer Neg Hx    Stroke Neg Hx    Angioedema Neg Hx    Allergic rhinitis Neg Hx    Atopy Neg Hx    Eczema Neg Hx    Immunodeficiency Neg Hx    Urticaria Neg Hx     Social History Social History   Tobacco Use   Smoking status: Every Day    Packs/day: 0.50    Types: Cigarettes   Smokeless tobacco: Never  Vaping Use   Vaping Use: Never used  Substance Use Topics   Alcohol use: No   Drug use: No     Allergies   Latex   Review of Systems Review of Systems  Constitutional:  Negative for chills and fever.  HENT:  Positive for congestion and sore throat. Negative for ear pain.   Eyes:  Negative for pain and visual disturbance.  Respiratory:  Positive for cough. Negative for shortness of breath.   Cardiovascular:  Negative for chest pain and palpitations.  Gastrointestinal:  Positive for nausea and vomiting. Negative for abdominal pain.  Genitourinary:  Negative for dysuria and hematuria.  Musculoskeletal:  Positive for myalgias. Negative for arthralgias and back pain.  Skin:  Negative for color change and rash.  Neurological:  Negative for seizures and syncope.  All other systems reviewed and are negative.    Physical Exam Triage Vital Signs ED Triage Vitals  Enc Vitals Group     BP      Pulse      Resp      Temp      Temp src      SpO2      Weight      Height      Head Circumference      Peak Flow      Pain Score      Pain Loc      Pain Edu?      Excl. in [place]?    No data found.  Updated Vital Signs BP (!) 141/86 (BP Location: Left Arm)   Pulse 92   Temp 99.6 F (37.6 C) (Oral)   Resp 18   LMP 11/22/2022 (Exact Date)   SpO2 96%   Visual Acuity Right Eye Distance:   Left Eye Distance:   Bilateral Distance:    Right Eye Near:   Left Eye Near:    Bilateral Near:  Physical Exam Vitals and nursing note reviewed.   Constitutional:      General: She is not in acute distress.    Appearance: She is well-developed.  HENT:     Head: Normocephalic and atraumatic.  Eyes:     Conjunctiva/sclera: Conjunctivae normal.  Cardiovascular:     Rate and Rhythm: Normal rate and regular rhythm.     Heart sounds: No murmur heard. Pulmonary:     Effort: Pulmonary effort is normal. No respiratory distress.     Breath sounds: Normal breath sounds.  Abdominal:     Palpations: Abdomen is soft.     Tenderness: There is no abdominal tenderness.  Musculoskeletal:        General: No swelling.     Cervical back: Neck supple.  Skin:    General: Skin is warm and dry.     Capillary Refill: Capillary refill takes less than 2 seconds.  Neurological:     Mental Status: She is alert.  Psychiatric:        Mood and Affect: Mood normal.      UC Treatments / Results  Labs (all labs ordered are listed, but only abnormal results are displayed) Labs Reviewed  RESP PANEL BY RT-PCR (FLU A&B, COVID) [id number]    EKG   Radiology No results found.  Procedures Procedures (including critical care time)  Medications Ordered in UC Medications - No data to display  Initial Impression / Assessment and Plan / UC Course  I have reviewed the triage vital signs and the nursing notes.  Pertinent labs & imaging results that were available during my care of the patient were reviewed by me and considered in my medical decision making (see chart for details).     URI, COVID/flu test pending.  Patient has had known COVID exposure.  She is overall well-appearing, no acute distress, lungs clear, vitals within normal limits.  Discussed supportive treatments at home.  Cough syrup sent to pharmacy.  Work note provided.    Final Clinical Impressions(s) / UC Diagnoses   Final diagnoses:  Acute upper respiratory infection     Discharge Instructions      Can take cough syrup as needed. Recommend Flonase and Mucinex. Drink plenty  of fluids and rest. COVID/flu test pending. If COVID-positive recommend staying away from people for 5 days, then wearing a mask around others for another 5 days.  Return if you develop any new or worsening symptoms.     ED Prescriptions     Medication Sig Dispense Auth. Provider   promethazine-dextromethorphan (PROMETHAZINE-DM) 6.25-15 MG/5ML syrup Take 5 mLs by mouth 4 (four) times daily as needed for cough. 118 mL Ward, Lenise Arena, PA-C      PDMP not reviewed this encounter.   Ward, Lenise Arena, PA-C 12/07/22 1820    Ward, Lenise Arena, PA-C 12/07/22 1845

## 2022-12-07 NOTE — Discharge Instructions (Addendum)
Can take cough syrup as needed. Recommend Flonase and Mucinex. Drink plenty of fluids and rest. COVID/flu test pending. If COVID-positive recommend staying away from people for 5 days, then wearing a mask around others for another 5 days.  Return if you develop any new or worsening symptoms.

## 2023-08-06 ENCOUNTER — Encounter (HOSPITAL_COMMUNITY): Payer: Self-pay

## 2023-08-06 ENCOUNTER — Emergency Department (HOSPITAL_COMMUNITY)
Admission: EM | Admit: 2023-08-06 | Discharge: 2023-08-06 | Disposition: A | Discharge status: Home / Self Care | Payer: BC Managed Care – PPO | Attending: Emergency Medicine | Admitting: Emergency Medicine

## 2023-08-06 DIAGNOSIS — R55 Syncope and collapse: Secondary | ICD-10-CM | POA: Insufficient documentation

## 2023-08-06 DIAGNOSIS — Z91040 Latex allergy status: Secondary | ICD-10-CM | POA: Insufficient documentation

## 2023-08-06 DIAGNOSIS — R42 Dizziness and giddiness: Secondary | ICD-10-CM | POA: Insufficient documentation

## 2023-08-06 LAB — CBC WITH DIFFERENTIAL/PLATELET
Abs Immature Granulocytes: 0.01 10*3/uL (ref 0.00–0.07)
Basophils Absolute: 0.1 10*3/uL (ref 0.0–0.1)
Basophils Relative: 1 %
Eosinophils Absolute: 0.3 10*3/uL (ref 0.0–0.5)
Eosinophils Relative: 4 %
HCT: 42.5 % (ref 36.0–46.0)
Hemoglobin: 14.6 g/dL (ref 12.0–15.0)
Immature Granulocytes: 0 %
Lymphocytes Relative: 30 %
Lymphs Abs: 2.7 10*3/uL (ref 0.7–4.0)
MCH: 30.8 pg (ref 26.0–34.0)
MCHC: 34.4 g/dL (ref 30.0–36.0)
MCV: 89.7 fL (ref 80.0–100.0)
Monocytes Absolute: 0.7 10*3/uL (ref 0.1–1.0)
Monocytes Relative: 8 %
Neutro Abs: 5.3 10*3/uL (ref 1.7–7.7)
Neutrophils Relative %: 57 %
Platelets: 234 10*3/uL (ref 150–400)
RBC: 4.74 MIL/uL (ref 3.87–5.11)
RDW: 11.9 % (ref 11.5–15.5)
WBC: 9.1 10*3/uL (ref 4.0–10.5)
nRBC: 0 % (ref 0.0–0.2)

## 2023-08-06 LAB — URINALYSIS, ROUTINE W REFLEX MICROSCOPIC
Bacteria, UA: NONE SEEN
Bilirubin Urine: NEGATIVE
Glucose, UA: NEGATIVE mg/dL
Ketones, ur: NEGATIVE mg/dL
Leukocytes,Ua: NEGATIVE
Nitrite: NEGATIVE
Protein, ur: NEGATIVE mg/dL
Specific Gravity, Urine: 1.001 — ABNORMAL LOW (ref 1.005–1.030)
pH: 6 (ref 5.0–8.0)

## 2023-08-06 LAB — BASIC METABOLIC PANEL
Anion gap: 8 (ref 5–15)
BUN: 8 mg/dL (ref 6–20)
CO2: 23 mmol/L (ref 22–32)
Calcium: 9 mg/dL (ref 8.9–10.3)
Chloride: 106 mmol/L (ref 98–111)
Creatinine, Ser: 0.69 mg/dL (ref 0.44–1.00)
GFR, Estimated: >60 mL/min (ref >60)
Glucose, Bld: 68 mg/dL — ABNORMAL LOW (ref 70–99)
Potassium: 3.7 mmol/L (ref 3.5–5.1)
Sodium: 137 mmol/L (ref 135–145)

## 2023-08-06 LAB — PREGNANCY, URINE: Preg Test, Ur: NEGATIVE

## 2023-08-06 LAB — TSH: TSH: 1.958 u[IU]/mL (ref 0.350–4.500)

## 2023-08-06 MED ORDER — METOCLOPRAMIDE HCL 5 MG/ML IJ SOLN
10.0000 mg | Freq: Once | INTRAMUSCULAR | Status: AC
  Administered 2023-08-06: 10 mg via INTRAVENOUS
  Filled 2023-08-06: qty 2

## 2023-08-06 MED ORDER — SODIUM CHLORIDE 0.9 % IV BOLUS
1000.0000 mL | Freq: Once | INTRAVENOUS | Status: AC
  Administered 2023-08-06: 1000 mL via INTRAVENOUS

## 2023-08-06 NOTE — ED Triage Notes (Signed)
Pt BIB [facility] from work with c/o of sudden onset of dizziness while she was sitting down, reports she feels like she was spinning. Sitting down or laying down did not help her dizziness.   BP 136/78 HR 80 CBG 112

## 2023-08-06 NOTE — ED Provider Notes (Signed)
[place] EMERGENCY DEPARTMENT AT [facility] Provider Note   CSN: [id number] Arrival date & time: 08/06/23  1113     History  Chief Complaint  Patient presents with   Dizziness    Megan Colon is a 47 y.o. female.  This is a 47 year old female who presents to the emergency department today due to 2 near syncopal episodes that occurred while she was at work.  Patient has a history of migraine headaches, otherwise healthy.  She says that she was at work today, began to feel as though things suddenly became hazy and like she was going to faint.  She says that it resolved, however 30 minutes later the same thing happened again.  She is not currently feeling lightheaded, or dizzy.  Patient is currently on her menstrual period.   Dizziness      Home Medications Prior to Admission medications   Medication Sig Start Date End Date Taking? Authorizing Provider  artificial tears (LACRILUBE) OINT ophthalmic ointment Place into both eyes every 4 (four) hours as needed for dry eyes. 12/19/21   Linwood Dibbles, MD  azelastine (ASTELIN) 0.1 % nasal spray Place 1-2 sprays into both nostrils 2 (two) times daily as needed (runny nose). Use in each nostril as directed 05/01/21   Ellamae Sia, DO  cromolyn (OPTICROM) 4 % ophthalmic solution Place 1 drop into both eyes 4 (four) times daily as needed (itchy/watery eyes). 05/01/21   Ellamae Sia, DO  fluticasone (FLONASE) 50 MCG/ACT nasal spray Place 1 spray into both nostrils 2 (two) times daily as needed (nasal congestion). 05/01/21   Ellamae Sia, DO  ibuprofen (ADVIL) 600 MG tablet Take 1 tablet (600 mg total) by mouth every 8 (eight) hours as needed. 12/17/21   Mayers, Cari S, PA-C  levocetirizine (XYZAL) 5 MG tablet TAKE 1 TABLET(5 MG) BY MOUTH EVERY EVENING 12/26/21   Ellamae Sia, DO  metroNIDAZOLE (FLAGYL) 500 MG tablet Take 1 tablet (500 mg total) by mouth 2 (two) times daily. 06/16/22   Corlis Hove, NP  montelukast (SINGULAIR) 10 MG tablet Take  1 tablet (10 mg total) by mouth at bedtime. 05/01/21   Ellamae Sia, DO  ondansetron (ZOFRAN) 4 MG tablet Take 1 tablet (4 mg total) by mouth every 8 (eight) hours as needed for nausea or vomiting. 12/07/22   Ward, Tylene Fantasia, PA-C  pantoprazole (PROTONIX) 40 MG tablet Take 1 tablet (40 mg total) by mouth daily. 04/01/21   Rema Fendt, NP  promethazine-dextromethorphan (PROMETHAZINE-DM) 6.25-15 MG/5ML syrup Take 5 mLs by mouth 4 (four) times daily as needed for cough. 12/07/22   Ward, Tylene Fantasia, PA-C  SUMAtriptan (IMITREX) 100 MG tablet Take 1 tablet at the onset of migraine. May repeat in 2 hours if headache persists or recurs. Patient not taking: Reported on 06/10/2022 04/17/20   Butch Penny, NP  [facility] ophthalmic solution Place 1 drop into the right eye 4 (four) times daily. 12/16/21   [provider]  traZODone (DESYREL) 50 MG tablet Take 0.5-1 tablets (25-50 mg total) by mouth at bedtime as needed for sleep. Patient not taking: Reported on 06/10/2022 04/01/21   Rema Fendt, NP      Allergies    Latex and Percocet [oxycodone-acetaminophen]    Review of Systems   Review of Systems  Neurological:  Positive for dizziness.    Physical Exam Updated Vital Signs BP (!) 139/100   Pulse 83   Temp 98.8 F (37.1 C) (Oral)  Resp (!) 22   SpO2 100%  Physical Exam Vitals reviewed.  HENT:     Head: Normocephalic.  Cardiovascular:     Rate and Rhythm: Normal rate.     Pulses: Normal pulses.  Pulmonary:     Effort: No respiratory distress.     Breath sounds: No wheezing.  Abdominal:     General: Abdomen is flat.     Palpations: Abdomen is soft.  Neurological:     General: No focal deficit present.     Mental Status: She is alert.     Cranial Nerves: No cranial nerve deficit.     Motor: No weakness.     Gait: Gait normal.     ED Results / Procedures / Treatments   Labs (all labs ordered are listed, but only abnormal results are displayed) Labs Reviewed  BASIC  METABOLIC PANEL - Abnormal; Notable for the following components:      Result Value   Glucose, Bld 68 (*)    All other components within normal limits  URINALYSIS, ROUTINE W REFLEX MICROSCOPIC - Abnormal; Notable for the following components:   Color, Urine COLORLESS (*)    Specific Gravity, Urine 1.001 (*)    Hgb urine dipstick MODERATE (*)    All other components within normal limits  PREGNANCY, URINE  CBC WITH DIFFERENTIAL/PLATELET  TSH    EKG EKG Interpretation Date/Time:  Friday August 06 2023 11:22:49 EDT Ventricular Rate:  66 PR Interval:  189 QRS Duration:  89 QT Interval:  408 QTC Calculation: 428 R Axis:   57  Text Interpretation: Sinus rhythm Probable left atrial enlargement No significant change was found Confirmed by Anders Simmonds [id number]) on 08/06/2023 1:20:42 PM  Radiology No results found.  Procedures Procedures    Medications Ordered in ED Medications  sodium chloride 0.9 % bolus 1,000 mL (0 mLs Intravenous Stopped 08/06/23 1314)  metoCLOPramide (REGLAN) injection 10 mg (10 mg Intravenous Given 08/06/23 1207)    ED Course/ Medical Decision Making/ A&P                                 Medical Decision Making 47 year old female here today with near syncope.  Differential diagnoses include cardiogenic syncope, dehydration, anemia, metabolic abnormalities.  Plan-patient is low risk presyncope.  My dependent review the patient's EKG shows normal intervals, no evidence of ischemia or arrhythmia.  I reviewed the patient's labs, there is no evidence of anemia, no leukocytosis.  I did give the patient some Reglan because she says that she has been having a migraine type headache since she began her period, which is usual for her.  Reassessment-patient states that she is currently feeling better.  With normal labs, resolution of symptoms, believe this is likely vasovagal near syncope.  Will discharge patient home, have her follow-up with her PCP.  Amount and/or  Complexity of Data Reviewed Labs: ordered.  Risk Prescription drug management.           Final Clinical Impression(s) / ED Diagnoses Final diagnoses:  None    Rx / DC Orders ED Discharge Orders     None         Arletha Pili, DO 08/06/23 1324

## 2023-08-06 NOTE — ED Notes (Signed)
Pt stated she felt dizzy 3x while sitting down and has never experienced this before today.  She stated that she started her menstrual cycle on Thursday. Pt stated that she feels her bleeding is normal. Pt  complains that she gets headaches with her menstrual cycle. Pt notes she vomits every menstrual cycle.

## 2023-08-06 NOTE — Discharge Instructions (Addendum)
Your EKG, and blood work that was done here in the emergency department was normal.  I would like you to call your primary care doctor within 1 week to set up a follow-up appointment.  Make sure that you are getting plenty of water over the next 1 week, and getting plenty of sleep.  Come back to the emergency department if you have difficulty walking, difficulty speaking, pain in your chest, or lose consciousness.

## 2023-08-07 ENCOUNTER — Encounter: Payer: Self-pay | Admitting: Family

## 2023-08-16 ENCOUNTER — Other Ambulatory Visit: Payer: Self-pay | Admitting: Family

## 2023-08-16 NOTE — Telephone Encounter (Signed)
Medication Refill - Medication: SUMAtriptan (IMITREX) 100 MG tablet   Has the patient contacted their pharmacy? No.   Preferred Pharmacy (with phone number or street name):  [id number] - [place], [place] - [address] AT [facility] [address] [facility] [address] Phone: [phone]  Fax: [phone]     Has the patient been seen for an appointment in the last year OR does the patient have an upcoming appointment? Yes  Agent: Please be advised that RX refills may take up to 3 business days. We ask that you follow-up with your pharmacy.

## 2023-08-17 NOTE — Telephone Encounter (Signed)
Requested medications are due for refill today.  yes  Requested medications are on the active medications list.  yes  Last refill. 04/17/2020 #10 11 rf  Future visit scheduled.   yes  Notes to clinic.  Rx signed by Butch Penny. Pt last seen 2 years ago.    Requested Prescriptions  Pending Prescriptions Disp Refills   SUMAtriptan (IMITREX) 100 MG tablet 10 tablet 11    Sig: Take 1 tablet at the onset of migraine. May repeat in 2 hours if headache persists or recurs.     Neurology:  Migraine Therapy - Triptan Failed - 08/16/2023  3:36 PM      Failed - Valid encounter within last 12 months    Recent Outpatient Visits           2 years ago Perennial allergic rhinitis   [place] [facility] at [facility], Amy J, NP   2 years ago Acute non-recurrent sinusitis, unspecified location   [facility], Amy J, NP   3 years ago Class 1 obesity without serious comorbidity in adult, unspecified BMI, unspecified obesity type   [facility] at [facility], Kandee Keen, MD   3 years ago Class 1 obesity without serious comorbidity in adult, unspecified BMI, unspecified obesity type   [facility] at [facility], Kandee Keen, MD   3 years ago Class 1 obesity without serious comorbidity in adult, unspecified BMI, unspecified obesity type   [place] [facility] at [facility], Kandee Keen, MD       Future Appointments             In 2 weeks Georganna Skeans, MD [facility] at [facility] - Last BP in normal range    BP Readings from Last 1 Encounters:  08/06/23 138/82

## 2023-09-02 ENCOUNTER — Encounter: Payer: Self-pay | Admitting: Family Medicine

## 2023-09-02 ENCOUNTER — Ambulatory Visit (INDEPENDENT_AMBULATORY_CARE_PROVIDER_SITE_OTHER): Payer: BC Managed Care – PPO | Admitting: Family Medicine

## 2023-09-02 VITALS — BP 157/106 | HR 83 | Temp 98.8°F | Resp 16 | Ht 62.0 in | Wt 177.8 lb

## 2023-09-02 DIAGNOSIS — K219 Gastro-esophageal reflux disease without esophagitis: Secondary | ICD-10-CM | POA: Diagnosis not present

## 2023-09-02 DIAGNOSIS — R350 Frequency of micturition: Secondary | ICD-10-CM | POA: Diagnosis not present

## 2023-09-02 DIAGNOSIS — R030 Elevated blood-pressure reading, without diagnosis of hypertension: Secondary | ICD-10-CM | POA: Diagnosis not present

## 2023-09-02 DIAGNOSIS — G43829 Menstrual migraine, not intractable, without status migrainosus: Secondary | ICD-10-CM

## 2023-09-02 MED ORDER — RIZATRIPTAN BENZOATE 10 MG PO TABS: 10.0000 mg | ORAL_TABLET | ORAL | 0 refills | Status: DC | PRN

## 2023-09-02 MED ORDER — TOLTERODINE TARTRATE ER 2 MG PO CP24: 2.0000 mg | ORAL_CAPSULE | Freq: Every day | ORAL | 1 refills | Status: DC

## 2023-09-02 MED ORDER — OMEPRAZOLE 40 MG PO CPDR: 40.0000 mg | DELAYED_RELEASE_CAPSULE | Freq: Every day | ORAL | 3 refills | Status: AC

## 2023-09-02 NOTE — Progress Notes (Signed)
Follow up migraine.

## 2023-09-06 ENCOUNTER — Encounter: Payer: Self-pay | Admitting: Family Medicine

## 2023-09-06 NOTE — Progress Notes (Signed)
Established Patient Office Visit  Subjective    Patient ID: Megan Colon, female    DOB: 1976/02/03  Age: 47 y.o. MRN: [id number]  CC:  Chief Complaint  Patient presents with   Follow-up    Migraine     HPI Megan Colon presents for follow up of chronic med issues. Patient also reports some urinary frequency and urgency particularly at night.   Outpatient Encounter Medications as of 09/02/2023  Medication Sig   montelukast (SINGULAIR) 10 MG tablet Take 1 tablet (10 mg total) by mouth at bedtime.   omeprazole (PRILOSEC) 40 MG capsule Take 1 capsule (40 mg total) by mouth daily.   ondansetron (ZOFRAN) 4 MG tablet Take 1 tablet (4 mg total) by mouth every 8 (eight) hours as needed for nausea or vomiting.   pantoprazole (PROTONIX) 40 MG tablet Take 1 tablet (40 mg total) by mouth daily.   rizatriptan (MAXALT) 10 MG tablet Take 1 tablet (10 mg total) by mouth as needed for migraine. May repeat in 2 hours if needed   tolterodine (DETROL LA) 2 MG 24 hr capsule Take 1 capsule (2 mg total) by mouth daily.   artificial tears (LACRILUBE) OINT ophthalmic ointment Place into both eyes every 4 (four) hours as needed for dry eyes. (Patient not taking: Reported on 09/02/2023)   azelastine (ASTELIN) 0.1 % nasal spray Place 1-2 sprays into both nostrils 2 (two) times daily as needed (runny nose). Use in each nostril as directed (Patient not taking: Reported on 09/02/2023)   cromolyn (OPTICROM) 4 % ophthalmic solution Place 1 drop into both eyes 4 (four) times daily as needed (itchy/watery eyes). (Patient not taking: Reported on 09/02/2023)   fluticasone (FLONASE) 50 MCG/ACT nasal spray Place 1 spray into both nostrils 2 (two) times daily as needed (nasal congestion). (Patient not taking: Reported on 09/02/2023)   ibuprofen (ADVIL) 600 MG tablet Take 1 tablet (600 mg total) by mouth every 8 (eight) hours as needed. (Patient not taking: Reported on 09/02/2023)   levocetirizine (XYZAL) 5 MG tablet TAKE 1 TABLET(5  MG) BY MOUTH EVERY EVENING (Patient not taking: Reported on 09/02/2023)   metroNIDAZOLE (FLAGYL) 500 MG tablet Take 1 tablet (500 mg total) by mouth 2 (two) times daily. (Patient not taking: Reported on 09/02/2023)   promethazine-dextromethorphan (PROMETHAZINE-DM) 6.25-15 MG/5ML syrup Take 5 mLs by mouth 4 (four) times daily as needed for cough. (Patient not taking: Reported on 09/02/2023)   SUMAtriptan (IMITREX) 100 MG tablet Take 1 tablet at the onset of migraine. May repeat in 2 hours if headache persists or recurs. (Patient not taking: Reported on 09/02/2023)   TOBRADEX ophthalmic solution Place 1 drop into the right eye 4 (four) times daily. (Patient not taking: Reported on 09/02/2023)   traZODone (DESYREL) 50 MG tablet Take 0.5-1 tablets (25-50 mg total) by mouth at bedtime as needed for sleep. (Patient not taking: Reported on 09/02/2023)   No facility-administered encounter medications on file as of 09/02/2023.    Past Medical History:  Diagnosis Date   Allergy    Phreesia 12/09/2020   Anxiety    Phreesia 12/09/2020   Arthritis    GERD (gastroesophageal reflux disease)    Lung cancer (HCC)    Father   Migraines    since childhood    Past Surgical History:  Procedure Laterality Date   CESAREAN SECTION N/A 05/08/2017   Procedure: CESAREAN SECTION;  Surgeon: Lazaro Arms, MD;  Location: [facility] BIRTHING SUIT[facility];  Service: Obstetrics;  Laterality: N/A;   CESAREAN  SECTION N/A    Phreesia 12/09/2020   LASIK Bilateral 2006    Family History  Problem Relation Age of Onset   Diabetes Father    Hypertension Mother    Ovarian cancer Brother    Asthma Sister    Cancer Neg Hx    Stroke Neg Hx    Angioedema Neg Hx    Allergic rhinitis Neg Hx    Atopy Neg Hx    Eczema Neg Hx    Immunodeficiency Neg Hx    Urticaria Neg Hx     Social History   Socioeconomic History   Marital status: Single    Spouse name: Not on file   Number of children: Not on file   Years of education: Not on file    Highest education level: Not on file  Occupational History   Not on file  Tobacco Use   Smoking status: Every Day    Current packs/day: 0.50    Types: Cigarettes   Smokeless tobacco: Never  Vaping Use   Vaping status: Never Used  Substance and Sexual Activity   Alcohol use: No   Drug use: No   Sexual activity: Yes    Birth control/protection: None  Other Topics Concern   Not on file  Social History Narrative   Not on file   Social Determinants of Health   Financial Resource Strain: Low Risk  (09/02/2023)   Overall Financial Resource Strain (CARDIA)    Difficulty of Paying Living Expenses: Not hard at all  Food Insecurity: No Food Insecurity (09/02/2023)   Hunger Vital Sign    Worried About Running Out of Food in the Last Year: Never true    Ran Out of Food in the Last Year: Never true  Transportation Needs: No Transportation Needs (09/02/2023)   PRAPARE - Administrator, Civil Service (Medical): No    Lack of Transportation (Non-Medical): No  Physical Activity: Inactive (09/02/2023)   Exercise Vital Sign    Days of Exercise per Week: 0 days    Minutes of Exercise per Session: 0 min  Stress: No Stress Concern Present (09/02/2023)   [employer] of Occupational Health - Occupational Stress Questionnaire    Feeling of Stress : Not at all  Social Connections: Socially Isolated (09/02/2023)   Social Connection and Isolation Panel [NHANES]    Frequency of Communication with Friends and Family: Once a week    Frequency of Social Gatherings with Friends and Family: Once a week    Attends Religious Services: Never    Database administrator or Organizations: No    Attends Banker Meetings: Never    Marital Status: Divorced  Catering manager Violence: Not At Risk (09/02/2023)   Humiliation, Afraid, Rape, and Kick questionnaire    Fear of Current or Ex-Partner: No    Emotionally Abused: No    Physically Abused: No    Sexually Abused: No    Review  of Systems  Genitourinary:  Positive for frequency and urgency.  Neurological:  Positive for headaches.  All other systems reviewed and are negative.       Objective    BP (!) 157/106 (BP Location: Left Arm, Patient Position: Sitting, Cuff Size: Large)   Pulse 83   Temp 98.8 F (37.1 C) (Oral)   Resp 16   Ht 5\' 2"  (1.575 m)   Wt 177 lb 12.8 oz (80.6 kg)   SpO2 97%   BMI 32.52 kg/m   Physical Exam Vitals  and nursing note reviewed.  Constitutional:      General: She is not in acute distress. HENT:     Head: Normocephalic and atraumatic.  Cardiovascular:     Rate and Rhythm: Normal rate and regular rhythm.  Pulmonary:     Effort: Pulmonary effort is normal.     Breath sounds: Normal breath sounds.  Abdominal:     Palpations: Abdomen is soft.     Tenderness: There is no abdominal tenderness.  Neurological:     General: No focal deficit present.     Mental Status: She is alert and oriented to person, place, and time.         Assessment & Plan:  1. Menstrual migraine without status migrainosus, not intractable Maxalt prescribed. Recommend headache diary.   2. Elevated blood pressure reading in office without diagnosis of hypertension   3. Gastroesophageal reflux disease without esophagitis Omeprazole prescribed. Reviewed dietary and activity options.   4. Urinary frequency Detrol prescribed.   Return in about 4 weeks (around 09/30/2023) for follow up.   Tommie Raymond, MD

## 2023-09-07 ENCOUNTER — Telehealth: Payer: Self-pay | Admitting: *Deleted

## 2023-09-07 NOTE — Telephone Encounter (Signed)
Medication refill

## 2023-09-09 NOTE — Telephone Encounter (Signed)
Patient said she got herr medication

## 2023-09-30 ENCOUNTER — Ambulatory Visit: Payer: BC Managed Care – PPO | Admitting: Family Medicine

## 2023-12-09 ENCOUNTER — Other Ambulatory Visit: Payer: Self-pay | Admitting: Family Medicine

## 2023-12-09 MED ORDER — RIZATRIPTAN BENZOATE 10 MG PO TABS: 10.0000 mg | ORAL_TABLET | ORAL | 2 refills | Status: DC | PRN

## 2023-12-09 NOTE — Telephone Encounter (Signed)
Requested Prescriptions  Pending Prescriptions Disp Refills   rizatriptan (MAXALT) 10 MG tablet 10 tablet 2    Sig: Take 1 tablet (10 mg total) by mouth as needed for migraine. May repeat in 2 hours if needed     Neurology:  Migraine Therapy - Triptan Failed - 12/09/2023  1:50 PM      Failed - Last BP in normal range    BP Readings from Last 1 Encounters:  09/02/23 (!) 157/106         Passed - Valid encounter within last 12 months    Recent Outpatient Visits           3 months ago Menstrual migraine without status migrainosus, not intractable   [place] [facility] at [facility], MD   2 years ago Perennial allergic rhinitis   [place] [facility] at [facility], Virginia J, NP   3 years ago Acute non-recurrent sinusitis, unspecified location   [facility] at [facility], Amy J, NP   3 years ago Class 1 obesity without serious comorbidity in adult, unspecified BMI, unspecified obesity type   [facility] at [facility], Kandee Keen, DO   3 years ago Class 1 obesity without serious comorbidity in adult, unspecified BMI, unspecified obesity type   [facility] at [facility], Kandee Keen, DO

## 2023-12-09 NOTE — Telephone Encounter (Signed)
Medication Refill -  Most Recent Primary Care Visit:  Provider: Georganna Skeans  Department: [facility]  Visit Type: HOSPITAL FU  Date: 09/02/2023  Medication: rizatriptan (MAXALT) 10 MG table Pt requesting refill, states the medication is working well for her and is requesting additional refills.   Has the patient contacted their pharmacy? Yes (Agent: If yes, when and what did the pharmacy advise?) Contact office.   Is this the correct pharmacy for this prescription? Yes This is the patient's preferred pharmacy:  [facility] #[id number] [place], [place] - [address] AT [facility] [address] [facility] [address] [place] [address] Phone: [phone] Fax: [phone]   Has the prescription been filled recently? No  Is the patient out of the medication? Yes  Has the patient been seen for an appointment in the last year OR does the patient have an upcoming appointment? Yes  Can we respond through MyChart? Yes  Agent: Please be advised that Rx refills may take up to 3 business days. We ask that you follow-up with your pharmacy.

## 2023-12-09 NOTE — Telephone Encounter (Signed)
Requested Prescriptions  Refused Prescriptions Disp Refills   rizatriptan (MAXALT) 10 MG tablet [Pharmacy Med Name: RIZATRIPTAN 10MG  TABLETS] 10 tablet 0    Sig: TAKE 1 TABLET BY MOUTH AS NEEDED FOR MIGRAINE. MAY REPEAT ONCE AFTER 2 HOURS IF NEEDED     Neurology:  Migraine Therapy - Triptan Failed - 12/09/2023  2:01 PM      Failed - Last BP in normal range    BP Readings from Last 1 Encounters:  09/02/23 (!) 157/106         Passed - Valid encounter within last 12 months    Recent Outpatient Visits           3 months ago Menstrual migraine without status migrainosus, not intractable   [place] [facility] at [facility], MD   2 years ago Perennial allergic rhinitis   [place] [facility] at [facility], Virginia J, NP   3 years ago Acute non-recurrent sinusitis, unspecified location   [facility] Care at [facility], Amy J, NP   3 years ago Class 1 obesity without serious comorbidity in adult, unspecified BMI, unspecified obesity type   [facility] Care at [facility], Kandee Keen, DO   3 years ago Class 1 obesity without serious comorbidity in adult, unspecified BMI, unspecified obesity type   [facility] Care at [facility], Kandee Keen, DO

## 2023-12-13 ENCOUNTER — Telehealth: Payer: Self-pay | Admitting: Family Medicine

## 2023-12-13 NOTE — Telephone Encounter (Signed)
Pt is calling in because the pharmacy says she need a prior authorization for rizatriptan (MAXALT) 10 MG tablet [[id number]] before the insurance will cover it.

## 2023-12-16 NOTE — Telephone Encounter (Signed)
Sent to [employer] for PA completion

## 2023-12-27 ENCOUNTER — Other Ambulatory Visit: Payer: Self-pay

## 2024-02-04 ENCOUNTER — Other Ambulatory Visit: Payer: Self-pay

## 2024-02-04 ENCOUNTER — Emergency Department (HOSPITAL_COMMUNITY): Payer: BC Managed Care – PPO

## 2024-02-04 ENCOUNTER — Encounter (HOSPITAL_COMMUNITY): Payer: Self-pay

## 2024-02-04 ENCOUNTER — Emergency Department (HOSPITAL_COMMUNITY)
Admission: EM | Admit: 2024-02-04 | Discharge: 2024-02-04 | Discharge status: Against Medical Advice | Payer: BC Managed Care – PPO | Attending: Emergency Medicine | Admitting: Emergency Medicine

## 2024-02-04 DIAGNOSIS — R1030 Lower abdominal pain, unspecified: Secondary | ICD-10-CM | POA: Insufficient documentation

## 2024-02-04 DIAGNOSIS — M79641 Pain in right hand: Secondary | ICD-10-CM | POA: Diagnosis not present

## 2024-02-04 DIAGNOSIS — R0789 Other chest pain: Secondary | ICD-10-CM | POA: Diagnosis not present

## 2024-02-04 DIAGNOSIS — Z5321 Procedure and treatment not carried out due to patient leaving prior to being seen by health care provider: Secondary | ICD-10-CM | POA: Insufficient documentation

## 2024-02-04 DIAGNOSIS — Y92410 Unspecified street and highway as the place of occurrence of the external cause: Secondary | ICD-10-CM | POA: Diagnosis not present

## 2024-02-04 DIAGNOSIS — S3993XA Unspecified injury of pelvis, initial encounter: Secondary | ICD-10-CM | POA: Diagnosis not present

## 2024-02-04 DIAGNOSIS — M542 Cervicalgia: Secondary | ICD-10-CM | POA: Diagnosis not present

## 2024-02-04 DIAGNOSIS — R6884 Jaw pain: Secondary | ICD-10-CM | POA: Diagnosis not present

## 2024-02-04 DIAGNOSIS — M79602 Pain in left arm: Secondary | ICD-10-CM | POA: Insufficient documentation

## 2024-02-04 DIAGNOSIS — R519 Headache, unspecified: Secondary | ICD-10-CM | POA: Insufficient documentation

## 2024-02-04 DIAGNOSIS — R079 Chest pain, unspecified: Secondary | ICD-10-CM | POA: Insufficient documentation

## 2024-02-04 DIAGNOSIS — S0990XA Unspecified injury of head, initial encounter: Secondary | ICD-10-CM | POA: Diagnosis not present

## 2024-02-04 DIAGNOSIS — N8312 Corpus luteum cyst of left ovary: Secondary | ICD-10-CM | POA: Diagnosis not present

## 2024-02-04 DIAGNOSIS — S299XXA Unspecified injury of thorax, initial encounter: Secondary | ICD-10-CM | POA: Diagnosis not present

## 2024-02-04 DIAGNOSIS — S3991XA Unspecified injury of abdomen, initial encounter: Secondary | ICD-10-CM | POA: Diagnosis not present

## 2024-02-04 DIAGNOSIS — S199XXA Unspecified injury of neck, initial encounter: Secondary | ICD-10-CM | POA: Diagnosis not present

## 2024-02-04 LAB — CBC
HCT: 44.1 % (ref 36.0–46.0)
Hemoglobin: 15 g/dL (ref 12.0–15.0)
MCH: 30.2 pg (ref 26.0–34.0)
MCHC: 34 g/dL (ref 30.0–36.0)
MCV: 88.7 fL (ref 80.0–100.0)
Platelets: 317 10*3/uL (ref 150–400)
RBC: 4.97 MIL/uL (ref 3.87–5.11)
RDW: 12.4 % (ref 11.5–15.5)
WBC: 11.1 10*3/uL — ABNORMAL HIGH (ref 4.0–10.5)
nRBC: 0 % (ref 0.0–0.2)

## 2024-02-04 LAB — COMPREHENSIVE METABOLIC PANEL
ALT: 15 U/L (ref 0–44)
AST: 15 U/L (ref 15–41)
Albumin: 4.4 g/dL (ref 3.5–5.0)
Alkaline Phosphatase: 45 U/L (ref 38–126)
Anion gap: 13 (ref 5–15)
BUN: 10 mg/dL (ref 6–20)
CO2: 23 mmol/L (ref 22–32)
Calcium: 9.3 mg/dL (ref 8.9–10.3)
Chloride: 101 mmol/L (ref 98–111)
Creatinine, Ser: 0.71 mg/dL (ref 0.44–1.00)
GFR, Estimated: >60 mL/min (ref >60)
Glucose, Bld: 92 mg/dL (ref 70–99)
Potassium: 4.1 mmol/L (ref 3.5–5.1)
Sodium: 137 mmol/L (ref 135–145)
Total Bilirubin: 0.8 mg/dL (ref 0.0–1.2)
Total Protein: 7.5 g/dL (ref 6.5–8.1)

## 2024-02-04 LAB — TROPONIN I (HIGH SENSITIVITY)
Troponin I (High Sensitivity): 2 ng/L (ref <18)
Troponin I (High Sensitivity): 2 ng/L (ref <18)

## 2024-02-04 LAB — HCG, SERUM, QUALITATIVE: Preg, Serum: NEGATIVE

## 2024-02-04 MED ORDER — IOHEXOL 350 MG/ML SOLN
75.0000 mL | Freq: Once | INTRAVENOUS | Status: AC | PRN
  Administered 2024-02-04: 75 mL via INTRAVENOUS
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
## 2024-02-04 NOTE — ED Notes (Signed)
Pt decided to leave
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
## 2024-02-17 ENCOUNTER — Other Ambulatory Visit: Payer: Self-pay | Admitting: Family Medicine

## 2024-02-17 ENCOUNTER — Inpatient Hospital Stay: Payer: BC Managed Care – PPO | Admitting: Family Medicine

## 2024-02-17 NOTE — Telephone Encounter (Unsigned)
 Copied from CRM [id number]. Topic: Clinical - Medication Refill >> Feb 17, 2024 10:31 AM Gildardo Pounds wrote: Most Recent Primary Care Visit:  Provider: Georganna Skeans  Department: [facility]  Visit Type: HOSPITAL FU  Date: 09/02/2023  Medication: rizatriptan (MAXALT) 10 MG tablet  Has the patient contacted their pharmacy? Yes (Agent: If no, request that the patient contact the pharmacy for the refill. If patient does not wish to contact the pharmacy document the reason why and proceed with request.) (Agent: If yes, when and what did the pharmacy advise?)  Is this the correct pharmacy for this prescription? Yes If no, delete pharmacy and type the correct one.  This is the patient's preferred pharmacy:  [facility] #[id number] [place], [place] - [address] AT [facility] [address] & [facility] [address] [place] [address] Phone: [phone] Fax: [phone]  Has the prescription been filled recently? No  Is the patient out of the medication? Yes  Has the patient been seen for an appointment in the last year OR does the patient have an upcoming appointment? Yes  Can we respond through MyChart? Yes  Agent: Please be advised that Rx refills may take up to 3 business days. We ask that you follow-up with your pharmacy.

## 2024-02-18 MED ORDER — RIZATRIPTAN BENZOATE 10 MG PO TABS: 10.0000 mg | ORAL_TABLET | ORAL | 0 refills | Status: DC | PRN

## 2024-02-18 NOTE — Telephone Encounter (Signed)
 Requested Prescriptions  Pending Prescriptions Disp Refills   rizatriptan (MAXALT) 10 MG tablet 10 tablet 0    Sig: Take 1 tablet (10 mg total) by mouth as needed for migraine. May repeat in 2 hours if needed     Neurology:  Migraine Therapy - Triptan Failed - 02/18/2024 11:48 AM      Failed - Last BP in normal range    BP Readings from Last 1 Encounters:  02/04/24 (!) 152/105         Passed - Valid encounter within last 12 months    Recent Outpatient Visits           5 months ago Menstrual migraine without status migrainosus, not intractable   [facility], MD   2 years ago Perennial allergic rhinitis    [facility] at [facility], Virginia J, NP   3 years ago Acute non-recurrent sinusitis, unspecified location   [facility] at [facility], Amy J, NP   3 years ago Class 1 obesity without serious comorbidity in adult, unspecified BMI, unspecified obesity type   [facility], Kandee Keen, DO   3 years ago Class 1 obesity without serious comorbidity in adult, unspecified BMI, unspecified obesity type   [facility] at [facility], Kandee Keen, DO

## 2024-02-23 ENCOUNTER — Telehealth: Payer: Self-pay

## 2024-02-23 ENCOUNTER — Encounter: Payer: Self-pay | Admitting: Family Medicine

## 2024-02-23 ENCOUNTER — Ambulatory Visit (INDEPENDENT_AMBULATORY_CARE_PROVIDER_SITE_OTHER): Payer: BC Managed Care – PPO | Admitting: Family

## 2024-02-23 DIAGNOSIS — I10 Essential (primary) hypertension: Secondary | ICD-10-CM | POA: Diagnosis not present

## 2024-02-23 DIAGNOSIS — R0989 Other specified symptoms and signs involving the circulatory and respiratory systems: Secondary | ICD-10-CM | POA: Diagnosis not present

## 2024-02-23 DIAGNOSIS — Z1329 Encounter for screening for other suspected endocrine disorder: Secondary | ICD-10-CM | POA: Diagnosis not present

## 2024-02-23 DIAGNOSIS — M436 Torticollis: Secondary | ICD-10-CM

## 2024-02-23 MED ORDER — CYCLOBENZAPRINE HCL 5 MG PO TABS
5.0000 mg | ORAL_TABLET | Freq: Three times a day (TID) | ORAL | 1 refills | Status: DC | PRN
Start: 2024-02-23 — End: 2024-03-14

## 2024-02-23 MED ORDER — VALSARTAN 40 MG PO TABS
40.0000 mg | ORAL_TABLET | Freq: Every day | ORAL | 1 refills | Status: DC
Start: 2024-02-23 — End: 2024-03-30

## 2024-02-23 NOTE — Progress Notes (Signed)
 Patient states she can't sleep  States her neck and everything is hurting.   Patient has multiple complaints to discuss.

## 2024-02-23 NOTE — Telephone Encounter (Signed)
 Copied from CRM [id number]. Topic: Clinical - Lab/Test Results >> Feb 23, 2024  3:42 PM Antwanette L wrote: Reason for CRM: Patient is requesting a callback for   Strep throat results. Patient can be reached at  [phone].
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
## 2024-02-23 NOTE — Telephone Encounter (Signed)
 Copied from CRM [id number]. Topic: Clinical - Prescription Issue >> Feb 23, 2024  3:34 PM Megan Colon wrote: Reason for CRM: patient said [facility] told her the refill for rizatriptan (MAXALT) 10 MG tablet was only for one pill and that Dr. Andrey Campanile needs to send over a new request. Patient is currently out of the medicine. Patient can be contacted at [phone]. Listed below is the patient preferred pharmacy.  [facility] [phone] [address] AT [facility] [address] [facility] [address] Phone: [phone] Fax: [phone]

## 2024-02-23 NOTE — Telephone Encounter (Signed)
 Routing to office

## 2024-02-24 LAB — BASIC METABOLIC PANEL
BUN/Creatinine Ratio: 12 (ref 9–23)
BUN: 8 mg/dL (ref 6–24)
CO2: 22 mmol/L (ref 20–29)
Calcium: 9.5 mg/dL (ref 8.7–10.2)
Chloride: 102 mmol/L (ref 96–106)
Creatinine, Ser: 0.66 mg/dL (ref 0.57–1.00)
Glucose: 75 mg/dL (ref 70–99)
Potassium: 4.6 mmol/L (ref 3.5–5.2)
Sodium: 139 mmol/L (ref 134–144)
eGFR: 109 mL/min/{1.73_m2} (ref >59)

## 2024-02-24 LAB — TSH: TSH: 1.79 u[IU]/mL (ref 0.450–4.500)

## 2024-02-24 LAB — COVID-19, FLU A+B AND RSV
Influenza A, NAA: NOT DETECTED
Influenza B, NAA: NOT DETECTED
RSV, NAA: NOT DETECTED
SARS-CoV-2, NAA: NOT DETECTED

## 2024-02-26 LAB — CULTURE, GROUP A STREP: Strep A Culture: NEGATIVE

## 2024-02-29 ENCOUNTER — Encounter: Payer: Self-pay | Admitting: Family

## 2024-03-06 ENCOUNTER — Telehealth: Payer: Self-pay | Admitting: Emergency Medicine

## 2024-03-06 NOTE — Telephone Encounter (Signed)
 I called patient back to make sure her medication was taken care of.  She has already receive them.

## 2024-03-06 NOTE — Telephone Encounter (Signed)
 I called patient and made her aware that they put in a referral for her on 02/23/2024.  Patient asked for their phone number so I gave it to her.

## 2024-03-08 ENCOUNTER — Ambulatory Visit (INDEPENDENT_AMBULATORY_CARE_PROVIDER_SITE_OTHER): Admitting: Physician Assistant

## 2024-03-08 ENCOUNTER — Encounter: Payer: Self-pay | Admitting: Physician Assistant

## 2024-03-08 ENCOUNTER — Other Ambulatory Visit (INDEPENDENT_AMBULATORY_CARE_PROVIDER_SITE_OTHER): Payer: Self-pay

## 2024-03-08 DIAGNOSIS — M542 Cervicalgia: Secondary | ICD-10-CM | POA: Diagnosis not present
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
## 2024-03-13 ENCOUNTER — Telehealth: Payer: Self-pay | Admitting: Physician Assistant

## 2024-03-13 NOTE — Telephone Encounter (Signed)
 Patient called, says the muscle relaxer was not sent in to [facility]. And also PT referral.

## 2024-03-14 ENCOUNTER — Other Ambulatory Visit: Payer: Self-pay | Admitting: Physician Assistant

## 2024-03-14 DIAGNOSIS — M542 Cervicalgia: Secondary | ICD-10-CM

## 2024-03-14 DIAGNOSIS — M436 Torticollis: Secondary | ICD-10-CM

## 2024-03-14 MED ORDER — CYCLOBENZAPRINE HCL 5 MG PO TABS
5.0000 mg | ORAL_TABLET | Freq: Three times a day (TID) | ORAL | 1 refills | Status: DC | PRN
Start: 2024-03-14 — End: 2024-07-17
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
## 2024-03-16 ENCOUNTER — Encounter: Payer: Self-pay | Admitting: Physical Therapy

## 2024-03-16 ENCOUNTER — Ambulatory Visit: Attending: Physician Assistant | Admitting: Physical Therapy

## 2024-03-16 ENCOUNTER — Other Ambulatory Visit: Payer: Self-pay

## 2024-03-16 DIAGNOSIS — M542 Cervicalgia: Secondary | ICD-10-CM | POA: Insufficient documentation

## 2024-03-16 DIAGNOSIS — M7099 Unspecified soft tissue disorder related to use, overuse and pressure multiple sites: Secondary | ICD-10-CM | POA: Insufficient documentation

## 2024-03-21 ENCOUNTER — Ambulatory Visit: Attending: Family Medicine

## 2024-03-21 ENCOUNTER — Ambulatory Visit (INDEPENDENT_AMBULATORY_CARE_PROVIDER_SITE_OTHER): Admitting: Family Medicine

## 2024-03-21 ENCOUNTER — Encounter: Payer: Self-pay | Admitting: Family Medicine

## 2024-03-21 VITALS — BP 136/89 | HR 106 | Temp 98.0°F | Resp 16 | Ht 62.0 in | Wt 175.0 lb

## 2024-03-21 DIAGNOSIS — Z23 Encounter for immunization: Secondary | ICD-10-CM | POA: Diagnosis not present

## 2024-03-21 DIAGNOSIS — R000 Tachycardia, unspecified: Secondary | ICD-10-CM | POA: Diagnosis not present

## 2024-03-21 DIAGNOSIS — Z1211 Encounter for screening for malignant neoplasm of colon: Secondary | ICD-10-CM

## 2024-03-21 DIAGNOSIS — R002 Palpitations: Secondary | ICD-10-CM

## 2024-03-21 DIAGNOSIS — I10 Essential (primary) hypertension: Secondary | ICD-10-CM

## 2024-03-21 NOTE — Progress Notes (Unsigned)
 EP to read.

## 2024-03-21 NOTE — Progress Notes (Unsigned)
 Patient is here for medication refill. Patient has no other concerns  today

## 2024-03-23 ENCOUNTER — Encounter: Payer: Self-pay | Admitting: Family Medicine

## 2024-03-23 NOTE — Progress Notes (Signed)
 Established Patient Office Visit  Subjective    Patient ID: Megan Colon, female    DOB: 02-26-1976  Age: 48 y.o. MRN: [id number]  CC:  Chief Complaint  Patient presents with   Medical Management of Chronic Issues    HPI Nene Aranas presents for follow up of hypertension. She also complains of palpitations intermittently.   Outpatient Encounter Medications as of 03/21/2024  Medication Sig   cyclobenzaprine (FLEXERIL) 5 MG tablet Take 1 tablet (5 mg total) by mouth 3 (three) times daily as needed for muscle spasms.   omeprazole (PRILOSEC) 40 MG capsule Take 1 capsule (40 mg total) by mouth daily.   pantoprazole (PROTONIX) 40 MG tablet Take 1 tablet (40 mg total) by mouth daily.   rizatriptan (MAXALT) 10 MG tablet Take 1 tablet (10 mg total) by mouth as needed for migraine. May repeat in 2 hours if needed   tolterodine (DETROL LA) 2 MG 24 hr capsule Take 1 capsule (2 mg total) by mouth daily.   valsartan (DIOVAN) 40 MG tablet Take 1 tablet (40 mg total) by mouth daily.   levocetirizine (XYZAL) 5 MG tablet TAKE 1 TABLET(5 MG) BY MOUTH EVERY EVENING (Patient not taking: Reported on 03/21/2024)   metroNIDAZOLE (FLAGYL) 500 MG tablet Take 1 tablet (500 mg total) by mouth 2 (two) times daily. (Patient not taking: Reported on 03/21/2024)   montelukast (SINGULAIR) 10 MG tablet Take 1 tablet (10 mg total) by mouth at bedtime. (Patient not taking: Reported on 03/21/2024)   ondansetron (ZOFRAN) 4 MG tablet Take 1 tablet (4 mg total) by mouth every 8 (eight) hours as needed for nausea or vomiting. (Patient not taking: Reported on 03/21/2024)   promethazine-dextromethorphan (PROMETHAZINE-DM) 6.25-15 MG/5ML syrup Take 5 mLs by mouth 4 (four) times daily as needed for cough. (Patient not taking: Reported on 03/21/2024)   No facility-administered encounter medications on file as of 03/21/2024.    Past Medical History:  Diagnosis Date   Allergy    Phreesia 12/09/2020   Anxiety    Phreesia 12/09/2020    Arthritis    GERD (gastroesophageal reflux disease)    Lung cancer (HCC)    Father   Migraines    since childhood    Past Surgical History:  Procedure Laterality Date   CESAREAN SECTION N/A 05/08/2017   Procedure: CESAREAN SECTION;  Surgeon: Lazaro Arms, MD;  Location: [facility] SU[facility];  Service: Obstetrics;  Laterality: N/A;   CESAREAN SECTION N/A    Phreesia 12/09/2020   LASIK Bilateral 2006    Family History  Problem Relation Age of Onset   Diabetes Father    Hypertension Mother    Ovarian cancer Brother    Asthma Sister    Cancer Neg Hx    Stroke Neg Hx    Angioedema Neg Hx    Allergic rhinitis Neg Hx    Atopy Neg Hx    Eczema Neg Hx    Immunodeficiency Neg Hx    Urticaria Neg Hx     Social History   Socioeconomic History   Marital status: Single    Spouse name: Not on file   Number of children: Not on file   Years of education: Not on file   Highest education level: Not on file  Occupational History   Not on file  Tobacco Use   Smoking status: Every Day    Current packs/day: 0.50    Types: Cigarettes   Smokeless tobacco: Never  Vaping Use   Vaping status: Never  Used  Substance and Sexual Activity   Alcohol use: No   Drug use: No   Sexual activity: Yes    Birth control/protection: None  Other Topics Concern   Not on file  Social History Narrative   Not on file   Social Drivers of Health   Financial Resource Strain: Low Risk  (09/02/2023)   Overall Financial Resource Strain (CARDIA)    Difficulty of Paying Living Expenses: Not hard at all  Food Insecurity: No Food Insecurity (09/02/2023)   Hunger Vital Sign    Worried About Running Out of Food in the Last Year: Never true    Ran Out of Food in the Last Year: Never true  Transportation Needs: No Transportation Needs (09/02/2023)   PRAPARE - Administrator, Civil Service (Medical): No    Lack of Transportation (Non-Medical): No  Physical Activity: Inactive (09/02/2023)   Exercise  Vital Sign    Days of Exercise per Week: 0 days    Minutes of Exercise per Session: 0 min  Stress: No Stress Concern Present (09/02/2023)   [employer] of Occupational Health - Occupational Stress Questionnaire    Feeling of Stress : Not at all  Social Connections: Socially Isolated (09/02/2023)   Social Connection and Isolation Panel [NHANES]    Frequency of Communication with Friends and Family: Once a week    Frequency of Social Gatherings with Friends and Family: Once a week    Attends Religious Services: Never    Database administrator or Organizations: No    Attends Banker Meetings: Never    Marital Status: Divorced  Catering manager Violence: Not At Risk (09/02/2023)   Humiliation, Afraid, Rape, and Kick questionnaire    Fear of Current or Ex-Partner: No    Emotionally Abused: No    Physically Abused: No    Sexually Abused: No    Review of Systems  All other systems reviewed and are negative.       Objective    BP 136/89   Pulse (!) 106   Temp 98 F (36.7 C) (Oral)   Resp 16   Ht 5\' 2"  (1.575 m)   Wt 175 lb (79.4 kg)   LMP 02/11/2024 (Approximate)   SpO2 96%   BMI 32.01 kg/m   Physical Exam Vitals and nursing note reviewed.  Constitutional:      General: She is not in acute distress. Cardiovascular:     Rate and Rhythm: Normal rate and regular rhythm.  Pulmonary:     Effort: Pulmonary effort is normal.     Breath sounds: Normal breath sounds.  Abdominal:     Palpations: Abdomen is soft.     Tenderness: There is no abdominal tenderness.  Neurological:     General: No focal deficit present.     Mental Status: She is alert and oriented to person, place, and time.         Assessment & Plan:   Essential hypertension  Palpitations -     LONG TERM MONITOR (3-14 DAYS); Future -     EKG 12-Lead  Tachycardia -     LONG TERM MONITOR (3-14 DAYS); Future  Immunization due -     Pneumococcal conjugate vaccine 20-valent  Screening  for colon cancer -     Cologuard     Return in about 2 months (around 05/21/2024) for follow up.   Tommie Raymond, MD

## 2024-03-30 ENCOUNTER — Other Ambulatory Visit: Payer: Self-pay | Admitting: Family

## 2024-03-30 DIAGNOSIS — I10 Essential (primary) hypertension: Secondary | ICD-10-CM

## 2024-03-31 ENCOUNTER — Ambulatory Visit: Payer: Self-pay
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
## 2024-03-31 NOTE — Telephone Encounter (Signed)
**Note De-identified  [place] Obfuscation** Please advise 

## 2024-04-05 NOTE — Telephone Encounter (Signed)
 Noted pt has appt on 4/23

## 2024-04-06 ENCOUNTER — Other Ambulatory Visit: Payer: Self-pay | Admitting: Family Medicine

## 2024-04-06 NOTE — Telephone Encounter (Signed)
 Copied from CRM [id number]. Topic: Clinical - Medication Refill >> Apr 06, 2024  2:33 PM Emylou G wrote: Most Recent Primary Care Visit:  Provider: Abraham Abo  Department: [facility]  Visit Type: OFFICE VISIT  Date: 03/21/2024  Medication: rizatriptan (MAXALT) 10 MG tablet  Has the patient contacted their pharmacy? Yes (Agent: If no, request that the patient contact the pharmacy for the refill. If patient does not wish to contact the pharmacy document the reason why and proceed with request.) (Agent: If yes, when and what did the pharmacy advise?) said to contact us   Is this the correct pharmacy for this prescription? Yes If no, delete pharmacy and type the correct one.  This is the patient's preferred pharmacy:  [id number] Jonette Nestle, [place] - [address] AT [facility] [address] [facility] [address]  [place] [address] Phone: [phone] Fax: [phone]    Has the prescription been filled recently? Yes  Is the patient out of the medication? Yes  Has the patient been seen for an appointment in the last year OR does the patient have an upcoming appointment? Yes  Can we respond through MyChart? Yes  Agent: Please be advised that Rx refills may take up to 3 business days. We ask that you follow-up with your pharmacy.

## 2024-04-10 LAB — COLOGUARD

## 2024-04-10 MED ORDER — RIZATRIPTAN BENZOATE 10 MG PO TABS: 10.0000 mg | ORAL_TABLET | ORAL | 0 refills | Status: DC | PRN

## 2024-04-12 ENCOUNTER — Telehealth: Payer: Self-pay | Admitting: Family Medicine

## 2024-04-12 ENCOUNTER — Telehealth: Payer: Self-pay | Admitting: Physician Assistant

## 2024-04-12 DIAGNOSIS — G4700 Insomnia, unspecified: Secondary | ICD-10-CM

## 2024-04-12 DIAGNOSIS — J3089 Other allergic rhinitis: Secondary | ICD-10-CM

## 2024-04-12 DIAGNOSIS — F419 Anxiety disorder, unspecified: Secondary | ICD-10-CM

## 2024-04-12 MED ORDER — MONTELUKAST SODIUM 10 MG PO TABS: 10.0000 mg | ORAL_TABLET | Freq: Every day | ORAL | 5 refills | Status: DC

## 2024-04-12 MED ORDER — FLUTICASONE PROPIONATE 50 MCG/ACT NA SUSP: 2.0000 | Freq: Every day | NASAL | 6 refills | Status: DC

## 2024-04-12 MED ORDER — LEVOCETIRIZINE DIHYDROCHLORIDE 5 MG PO TABS
5.0000 mg | ORAL_TABLET | Freq: Every evening | ORAL | 5 refills | Status: AC
Start: 2024-04-12 — End: ?

## 2024-04-12 MED ORDER — TRAZODONE HCL 50 MG PO TABS: 50.0000 mg | ORAL_TABLET | Freq: Every evening | ORAL | 3 refills | Status: AC | PRN
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
## 2024-04-12 NOTE — Patient Instructions (Signed)
 [facility] [address], [place], [place] [address] [phone] or [phone] Walk-in urgent care 24/7 for anyone  For [facility] ONLY New patient assessments and therapy walk-ins: Monday and Wednesday 8am-11am First and second Friday 1pm-5pm New patient psychiatry and medication management walk-ins: Mondays, Wednesdays, Thursdays, Fridays 8am-11am No psychiatry walk-ins on Tuesday

## 2024-04-12 NOTE — Telephone Encounter (Signed)
 Patient had a VV with [name] PA today, patient forgot to mention she would like help to stop smoking and is requesting a prescription or patches to be sent.

## 2024-04-15 DIAGNOSIS — R002 Palpitations: Secondary | ICD-10-CM

## 2024-04-15 DIAGNOSIS — R000 Tachycardia, unspecified: Secondary | ICD-10-CM

## 2024-06-06 ENCOUNTER — Encounter: Payer: Self-pay | Admitting: Family

## 2024-06-06 ENCOUNTER — Telehealth: Payer: Self-pay | Admitting: Family

## 2024-06-06 ENCOUNTER — Telehealth: Payer: Self-pay

## 2024-06-06 ENCOUNTER — Ambulatory Visit (INDEPENDENT_AMBULATORY_CARE_PROVIDER_SITE_OTHER): Payer: Self-pay | Admitting: Family

## 2024-06-06 VITALS — BP 132/92 | HR 97 | Temp 98.5°F | Resp 16 | Ht 61.0 in | Wt 182.6 lb

## 2024-06-06 DIAGNOSIS — E669 Obesity, unspecified: Secondary | ICD-10-CM

## 2024-06-06 DIAGNOSIS — F17210 Nicotine dependence, cigarettes, uncomplicated: Secondary | ICD-10-CM

## 2024-06-06 DIAGNOSIS — F32A Depression, unspecified: Secondary | ICD-10-CM

## 2024-06-06 DIAGNOSIS — I10 Essential (primary) hypertension: Secondary | ICD-10-CM

## 2024-06-06 DIAGNOSIS — K219 Gastro-esophageal reflux disease without esophagitis: Secondary | ICD-10-CM

## 2024-06-06 DIAGNOSIS — F418 Other specified anxiety disorders: Secondary | ICD-10-CM

## 2024-06-06 DIAGNOSIS — Z6834 Body mass index (BMI) 34.0-34.9, adult: Secondary | ICD-10-CM

## 2024-06-06 DIAGNOSIS — Z7689 Persons encountering health services in other specified circumstances: Secondary | ICD-10-CM

## 2024-06-06 DIAGNOSIS — Z716 Tobacco abuse counseling: Secondary | ICD-10-CM

## 2024-06-06 MED ORDER — BUPROPION HCL ER (SR) 150 MG PO TB12
150.0000 mg | ORAL_TABLET | Freq: Every day | ORAL | 0 refills | Status: DC
Start: 2024-06-06 — End: 2024-10-11

## 2024-06-06 MED ORDER — HYDROXYZINE PAMOATE 25 MG PO CAPS: 25.0000 mg | ORAL_CAPSULE | Freq: Three times a day (TID) | ORAL | 2 refills | Status: DC | PRN

## 2024-06-06 MED ORDER — PANTOPRAZOLE SODIUM 20 MG PO TBEC: 20.0000 mg | DELAYED_RELEASE_TABLET | Freq: Every day | ORAL | 0 refills | Status: DC

## 2024-06-06 MED ORDER — VALSARTAN 80 MG PO TABS: 80.0000 mg | ORAL_TABLET | Freq: Every day | ORAL | 0 refills | Status: DC

## 2024-06-06 MED ORDER — FLUOXETINE HCL 10 MG PO TABS: 10.0000 mg | ORAL_TABLET | Freq: Every day | ORAL | 0 refills | Status: AC

## 2024-06-06 NOTE — Progress Notes (Signed)
 Patient is here for 6 week follow up and has insomnia and anxiety, patient scored 17 on her PHQ-9.  Patient scored a 20 on GAD-7

## 2024-06-06 NOTE — Telephone Encounter (Signed)
 I called pt with an appointment reminder for scheduled appt on 06/18. Pt stated she does not want to see Dr. Elvan Hamel and would like to transfer care to another provider. I recommended nurse practitioner Lavona Pounds and pt agreed. Pt stated during her last appointment with Dr. Elvan Hamel, the provider said yea I think you are crazy. To which the pt stated she took into offense because she stated it was not the right time for her to say that and was even more surprised because it was coming from her provider. Pt stated she has not mentioned anything because she did not want Dr. Elvan Hamel to know how she felt; pt does not want to feel uncomfortable when she comes to future visits and may see Dr. Elvan Hamel in the hallways. I did let pt know that I will have to document and let my practice administrator know. Pt agreed. I rescheduled appt to come in with her new provider Amy Rogerio Clay.

## 2024-06-06 NOTE — Progress Notes (Signed)
 Patient ID: Megan Colon, female    DOB: 1976/05/31  MRN: [id number]  CC: Chronic Conditions Follow-Up  Subjective: Megan Colon is a 48 y.o. female who presents for chronic conditions follow-up.  Her concerns today include:  - Doing well on Valsartan , no issues/concerns. She does not complain of red flag symptoms such as but not limited to chest pain, shortness of breath, worst headache of life, nausea/vomiting.  - States anxiety depression related to everything. States sometimes she worries about her children. States anxiety causing insomnia and she is no longer taking Trazodone  due to side effects of next day drowsiness. She would like to try medication to see if this helps with anxiety depression. She denies thoughts of self-harm, suicidal ideations, homicidal ideations. - Weight gain. States she snacks a lot. She does not exercise.  - States Omeprazole  no longer helping with acid reflux. - Reports smoking 9 cigarettes daily. States she has been smoking since 48 y.o. she would like to try a medication to see if this helps.  Patient Active Problem List   Diagnosis Date Noted   Other allergic rhinitis 05/01/2021   Allergic conjunctivitis of both eyes 05/01/2021   Tobacco user 05/01/2021   Coughing 05/01/2021   Heartburn 05/01/2021   Neck pain 07/31/2020   Degenerative disc disease, cervical 07/12/2020   Gestational hypertension 05/06/2017     Current Outpatient Medications on File Prior to Visit  Medication Sig Dispense Refill   cyclobenzaprine  (FLEXERIL ) 5 MG tablet Take 1 tablet (5 mg total) by mouth 3 (three) times daily as needed for muscle spasms. 30 tablet 1   fluticasone  (FLONASE ) 50 MCG/ACT nasal spray Place 2 sprays into both nostrils daily. 16 g 6   levocetirizine (XYZAL ) 5 MG tablet Take 1 tablet (5 mg total) by mouth every evening. 30 tablet 5   montelukast  (SINGULAIR ) 10 MG tablet Take 1 tablet (10 mg total) by mouth at bedtime. 30 tablet 5   rizatriptan  (MAXALT )  10 MG tablet Take 1 tablet (10 mg total) by mouth as needed for migraine. May repeat in 2 hours if needed 10 tablet 0   metroNIDAZOLE  (FLAGYL ) 500 MG tablet Take 1 tablet (500 mg total) by mouth 2 (two) times daily. (Patient not taking: Reported on 03/21/2024) 14 tablet 0   omeprazole  (PRILOSEC) 40 MG capsule Take 1 capsule (40 mg total) by mouth daily. (Patient not taking: Reported on 06/06/2024) 30 capsule 3   ondansetron  (ZOFRAN ) 4 MG tablet Take 1 tablet (4 mg total) by mouth every 8 (eight) hours as needed for nausea or vomiting. (Patient not taking: Reported on 03/21/2024) 20 tablet 0   pantoprazole  (PROTONIX ) 40 MG tablet Take 1 tablet (40 mg total) by mouth daily. (Patient not taking: Reported on 06/06/2024) 90 tablet 0   promethazine -dextromethorphan (PROMETHAZINE -DM) 6.25-15 MG/5ML syrup Take 5 mLs by mouth 4 (four) times daily as needed for cough. (Patient not taking: Reported on 03/21/2024) 118 mL 0   tolterodine  (DETROL  LA) 2 MG 24 hr capsule Take 1 capsule (2 mg total) by mouth daily. (Patient not taking: Reported on 06/06/2024) 30 capsule 1   traZODone  (DESYREL ) 50 MG tablet Take 1-2 tablets (50-100 mg total) by mouth at bedtime as needed for sleep. (Patient not taking: Reported on 06/06/2024) 60 tablet 3   No current facility-administered medications on file prior to visit.    Allergies  Allergen Reactions   Latex Itching   Percocet [Oxycodone -Acetaminophen ] Hives    Patient states she did fine on this medication.  Social History   Socioeconomic History   Marital status: Single    Spouse name: Not on file   Number of children: Not on file   Years of education: Not on file   Highest education level: Not on file  Occupational History   Not on file  Tobacco Use   Smoking status: Every Day    Current packs/day: 0.50    Types: Cigarettes   Smokeless tobacco: Never  Vaping Use   Vaping status: Never Used  Substance and Sexual Activity   Alcohol use: No   Drug use: No    Sexual activity: Yes    Birth control/protection: None  Other Topics Concern   Not on file  Social History Narrative   Not on file   Social Drivers of Health   Financial Resource Strain: Low Risk  (09/02/2023)   Overall Financial Resource Strain (CARDIA)    Difficulty of Paying Living Expenses: Not hard at all  Food Insecurity: No Food Insecurity (09/02/2023)   Hunger Vital Sign    Worried About Running Out of Food in the Last Year: Never true    Ran Out of Food in the Last Year: Never true  Transportation Needs: No Transportation Needs (09/02/2023)   PRAPARE - Administrator, Civil Service (Medical): No    Lack of Transportation (Non-Medical): No  Physical Activity: Inactive (09/02/2023)   Exercise Vital Sign    Days of Exercise per Week: 0 days    Minutes of Exercise per Session: 0 min  Stress: No Stress Concern Present (09/02/2023)   [employer] of Occupational Health - Occupational Stress Questionnaire    Feeling of Stress : Not at all  Social Connections: Socially Isolated (09/02/2023)   Social Connection and Isolation Panel    Frequency of Communication with Friends and Family: Once a week    Frequency of Social Gatherings with Friends and Family: Once a week    Attends Religious Services: Never    Database administrator or Organizations: No    Attends Banker Meetings: Never    Marital Status: Divorced  Catering manager Violence: Not At Risk (09/02/2023)   Humiliation, Afraid, Rape, and Kick questionnaire    Fear of Current or Ex-Partner: No    Emotionally Abused: No    Physically Abused: No    Sexually Abused: No    Family History  Problem Relation Age of Onset   Diabetes Father    Hypertension Mother    Ovarian cancer Brother    Asthma Sister    Cancer Neg Hx    Stroke Neg Hx    Angioedema Neg Hx    Allergic rhinitis Neg Hx    Atopy Neg Hx    Eczema Neg Hx    Immunodeficiency Neg Hx    Urticaria Neg Hx     Past Surgical  History:  Procedure Laterality Date   CESAREAN SECTION N/A 05/08/2017   Procedure: CESAREAN SECTION;  Surgeon: Wendelyn Halter, MD;  Location: [facility];  Service: Obstetrics;  Laterality: N/A;   CESAREAN SECTION N/A    Phreesia 12/09/2020   LASIK Bilateral 2006    ROS: Review of Systems Negative except as stated above  PHYSICAL EXAM: BP (!) 132/92   Pulse 97   Temp 98.5 F (36.9 C) (Oral)   Resp 16   Ht 5' 1 (1.549 m)   Wt 182 lb 9.6 oz (82.8 kg)   LMP 06/04/2024 (Exact Date)   SpO2 94%  BMI 34.50 kg/m   Wt Readings from Last 3 Encounters:  06/06/24 182 lb 9.6 oz (82.8 kg)  03/21/24 175 lb (79.4 kg)  02/23/24 179 lb 6.4 oz (81.4 kg)   Physical Exam HENT:     Head: Normocephalic and atraumatic.     Nose: Nose normal.     Mouth/Throat:     Mouth: Mucous membranes are moist.     Pharynx: Oropharynx is clear.   Eyes:     Extraocular Movements: Extraocular movements intact.     Conjunctiva/sclera: Conjunctivae normal.     Pupils: Pupils are equal, round, and reactive to light.    Cardiovascular:     Rate and Rhythm: Normal rate and regular rhythm.     Pulses: Normal pulses.     Heart sounds: Normal heart sounds.  Pulmonary:     Effort: Pulmonary effort is normal.     Breath sounds: Normal breath sounds.   Musculoskeletal:        General: Normal range of motion.     Cervical back: Normal range of motion and neck supple.   Neurological:     General: No focal deficit present.     Mental Status: She is alert and oriented to person, place, and time.   Psychiatric:        Mood and Affect: Mood normal.        Behavior: Behavior normal.     ASSESSMENT AND PLAN: 1. Primary hypertension (Primary) - Blood pressure not at goal during today's visit. Patient asymptomatic without chest pressure, chest pain, palpitations, shortness of breath, worst headache of life, and any additional red flag symptoms. - Increase Valsartan  from 40 mg to 80 mg as prescribed.   - Counseled on blood pressure goal of less than 130/80, low-sodium, DASH diet, medication compliance, and 150 minutes of moderate intensity exercise per week as tolerated. Counseled on medication adherence and adverse effects. - Follow-up with primary provider in 4 weeks or sooner if needed. - valsartan  (DIOVAN ) 80 MG tablet; Take 1 tablet (80 mg total) by mouth daily.  Dispense: 90 tablet; Refill: 0  2. Anxiety and depression - Patient denies thoughts of self-harm, suicidal ideations, homicidal ideations. - Fluoxetine and Hydroxyzine as prescribed. Counseled on medication adherence/adverse effects.  - Patient declined referral to Psychiatry.  - Follow-up with primary provider in 4 weeks or sooner if needed. - FLUoxetine (PROZAC) 10 MG tablet; Take 1 tablet (10 mg total) by mouth daily.  Dispense: 90 tablet; Refill: 0 - hydrOXYzine (VISTARIL) 25 MG capsule; Take 1 capsule (25 mg total) by mouth every 8 (eight) hours as needed.  Dispense: 30 capsule; Refill: 2  3. Encounter for weight management 4. BMI 34.0-34.9,adult - Will defer oral pharmacological therapy until patient's blood pressure stable (see #1). - Patient declined injectable pharmacological therapy.  - Patient declined referral to Medical Weight Management. - Follow-up with primary provider as scheduled.  5. Gastroesophageal reflux disease, unspecified whether esophagitis present - Pantoprazole  as prescribed. Counseled on medication adherence/adverse effects. - Follow-up with primary provider as scheduled. - pantoprazole  (PROTONIX ) 20 MG tablet; Take 1 tablet (20 mg total) by mouth daily.  Dispense: 90 tablet; Refill: 0  6. Encounter for smoking cessation counseling - Bupropion as prescribed. Counseled on medication adherence/adverse effects. - Follow-up with primary provider in 4 weeks or sooner if needed.  - buPROPion (WELLBUTRIN SR) 150 MG 12 hr tablet; Take 1 tablet (150 mg total) by mouth daily.  Dispense: 90 tablet;  Refill: 0  Patient was given the opportunity to ask questions.  Patient verbalized understanding of the plan and was able to repeat key elements of the plan. Patient was given clear instructions to go to Emergency Department or return to medical center if symptoms don't improve, worsen, or new problems develop.The patient verbalized understanding.   Requested Prescriptions   Signed Prescriptions Disp Refills   valsartan  (DIOVAN ) 80 MG tablet 90 tablet 0    Sig: Take 1 tablet (80 mg total) by mouth daily.   pantoprazole  (PROTONIX ) 20 MG tablet 90 tablet 0    Sig: Take 1 tablet (20 mg total) by mouth daily.   FLUoxetine (PROZAC) 10 MG tablet 90 tablet 0    Sig: Take 1 tablet (10 mg total) by mouth daily.   hydrOXYzine (VISTARIL) 25 MG capsule 30 capsule 2    Sig: Take 1 capsule (25 mg total) by mouth every 8 (eight) hours as needed.   buPROPion (WELLBUTRIN SR) 150 MG 12 hr tablet 90 tablet 0    Sig: Take 1 tablet (150 mg total) by mouth daily.    Return in about 4 weeks (around 07/04/2024) for Follow-Up or next available chronic conditions.  Senaida Dama, NP

## 2024-06-06 NOTE — Telephone Encounter (Signed)
 noted

## 2024-06-06 NOTE — Telephone Encounter (Signed)
 Copied from CRM [id number]. Topic: Appointments - Transfer of Care >> Jun 06, 2024 12:11 PM Carlatta H wrote: Pt is requesting to transfer FROM: Dr Elvan Hamel Pt is requesting to transfer TO: Lavona Pounds Reason for requested transfer: Patient feels more comfortable with Amy Rogerio Clay and she has been seen by her previously// It is the responsibility of the team the patient would like to transfer to (Dr. Lavona Pounds) to reach out to the patient if for any reason this transfer is not acceptable.  Please call the patient she would like to see amy stephens instead of Dr Elvan Hamel about anxiety and insomnia//

## 2024-06-07 ENCOUNTER — Ambulatory Visit: Payer: Self-pay | Admitting: Family Medicine

## 2024-06-22 ENCOUNTER — Telehealth: Payer: Self-pay | Admitting: Family

## 2024-06-22 DIAGNOSIS — K219 Gastro-esophageal reflux disease without esophagitis: Secondary | ICD-10-CM

## 2024-06-22 NOTE — Telephone Encounter (Signed)
 Contacted Chiquita, a pharmacist at [facility] regarding patient's protonix  20 mg. They reported they do have the 6/17 order on file and will fill it.    Copied from CRM [id number]. Topic: Clinical - Prescription Issue >> Jun 22, 2024  4:34 PM Avram MATSU wrote: Reason for CRM: pantoprazole  (PROTONIX ) 20 MG tablet [[id number]] patient has been waiting for her medication for acid reflux, It shows it was sent on the 6/17 but the patient stated the pharmacy does not have it.

## 2024-06-22 NOTE — Telephone Encounter (Signed)
 LVM for patient to return Practice Administrator's call at [phone]

## 2024-06-26 NOTE — Telephone Encounter (Signed)
 Called [facility] to see if patient had filled and picked up medication they stated no,  Staff stated that they weren't sure why it wasn't filled but will be filled today, I called patient and updated her

## 2024-07-17 ENCOUNTER — Encounter: Payer: Self-pay | Admitting: Family

## 2024-07-17 ENCOUNTER — Ambulatory Visit (INDEPENDENT_AMBULATORY_CARE_PROVIDER_SITE_OTHER): Payer: Self-pay | Admitting: Family

## 2024-07-17 VITALS — BP 135/92 | HR 98 | Temp 99.1°F | Resp 16 | Ht 61.0 in | Wt 180.4 lb

## 2024-07-17 DIAGNOSIS — Z0000 Encounter for general adult medical examination without abnormal findings: Secondary | ICD-10-CM | POA: Diagnosis not present

## 2024-07-17 DIAGNOSIS — Z13228 Encounter for screening for other metabolic disorders: Secondary | ICD-10-CM | POA: Diagnosis not present

## 2024-07-17 DIAGNOSIS — Z13220 Encounter for screening for lipoid disorders: Secondary | ICD-10-CM

## 2024-07-17 DIAGNOSIS — M436 Torticollis: Secondary | ICD-10-CM

## 2024-07-17 DIAGNOSIS — I10 Essential (primary) hypertension: Secondary | ICD-10-CM | POA: Diagnosis not present

## 2024-07-17 DIAGNOSIS — Z1211 Encounter for screening for malignant neoplasm of colon: Secondary | ICD-10-CM

## 2024-07-17 DIAGNOSIS — Z1329 Encounter for screening for other suspected endocrine disorder: Secondary | ICD-10-CM

## 2024-07-17 DIAGNOSIS — Z130 Encounter for screening for diseases of the blood and blood-forming organs and certain disorders involving the immune mechanism: Secondary | ICD-10-CM

## 2024-07-17 DIAGNOSIS — Z1231 Encounter for screening mammogram for malignant neoplasm of breast: Secondary | ICD-10-CM

## 2024-07-17 DIAGNOSIS — Z131 Encounter for screening for diabetes mellitus: Secondary | ICD-10-CM

## 2024-07-17 MED ORDER — AMLODIPINE BESYLATE 5 MG PO TABS: 5.0000 mg | ORAL_TABLET | Freq: Every day | ORAL | 0 refills | Status: DC

## 2024-07-17 MED ORDER — VALSARTAN 40 MG PO TABS
40.0000 mg | ORAL_TABLET | Freq: Every day | ORAL | 0 refills | Status: DC
Start: 2024-07-17 — End: 2024-10-11

## 2024-07-17 MED ORDER — CYCLOBENZAPRINE HCL 7.5 MG PO TABS
7.5000 mg | ORAL_TABLET | Freq: Three times a day (TID) | ORAL | 1 refills | Status: AC | PRN
Start: 2024-07-17 — End: ?

## 2024-07-17 NOTE — Progress Notes (Signed)
 Muscle relaxer is not working

## 2024-07-17 NOTE — Progress Notes (Signed)
 Patient ID: Megan Colon, female    DOB: 1976/05/20  MRN: [id number]  CC: Annual Exam  Subjective: Megan Colon is a 48 y.o. female who presents for annual exam.   Her concerns today include:  - Cervical cancer screening up to date per Care Gaps. - Patient prefers Cologuard. - States increased dose of Valsartan  (80 mg) caused her heart rate to increase. States she decreased Valsartan  back down to 40 mg and doing well on regimen with no issues/concerns. She does not complain of red flag symptoms such as but not limited to chest pain, shortness of breath, worst headache of life, nausea/vomiting.  - Neck stiffness persisting. Denies recent trauma/injury and red flag symptoms. States she doesn't feel Cyclobenzaprine  is helping much. Established with Physical Therapy/Acupuncture. She was established with Orthopedics in the past and would like referral back to the same.   Patient Active Problem List   Diagnosis Date Noted   Other allergic rhinitis 05/01/2021   Allergic conjunctivitis of both eyes 05/01/2021   Tobacco user 05/01/2021   Coughing 05/01/2021   Heartburn 05/01/2021   Neck pain 07/31/2020   Degenerative disc disease, cervical 07/12/2020   Gestational hypertension 05/06/2017     Current Outpatient Medications on File Prior to Visit  Medication Sig Dispense Refill   buPROPion  (WELLBUTRIN  SR) 150 MG 12 hr tablet Take 1 tablet (150 mg total) by mouth daily. 90 tablet 0   FLUoxetine  (PROZAC ) 10 MG tablet Take 1 tablet (10 mg total) by mouth daily. 90 tablet 0   fluticasone  (FLONASE ) 50 MCG/ACT nasal spray Place 2 sprays into both nostrils daily. 16 g 6   hydrOXYzine  (VISTARIL ) 25 MG capsule Take 1 capsule (25 mg total) by mouth every 8 (eight) hours as needed. 30 capsule 2   levocetirizine (XYZAL ) 5 MG tablet Take 1 tablet (5 mg total) by mouth every evening. 30 tablet 5   montelukast  (SINGULAIR ) 10 MG tablet Take 1 tablet (10 mg total) by mouth at bedtime. 30 tablet 5    pantoprazole  (PROTONIX ) 20 MG tablet Take 1 tablet (20 mg total) by mouth daily. 90 tablet 0   rizatriptan  (MAXALT ) 10 MG tablet Take 1 tablet (10 mg total) by mouth as needed for migraine. May repeat in 2 hours if needed 10 tablet 0   valsartan  (DIOVAN ) 80 MG tablet Take 1 tablet (80 mg total) by mouth daily. (Patient taking differently: Take 20 mg by mouth daily. Patient taking 40 mg) 90 tablet 0   metroNIDAZOLE  (FLAGYL ) 500 MG tablet Take 1 tablet (500 mg total) by mouth 2 (two) times daily. (Patient not taking: Reported on 03/21/2024) 14 tablet 0   omeprazole  (PRILOSEC) 40 MG capsule Take 1 capsule (40 mg total) by mouth daily. (Patient not taking: Reported on 06/06/2024) 30 capsule 3   ondansetron  (ZOFRAN ) 4 MG tablet Take 1 tablet (4 mg total) by mouth every 8 (eight) hours as needed for nausea or vomiting. (Patient not taking: Reported on 03/21/2024) 20 tablet 0   pantoprazole  (PROTONIX ) 40 MG tablet Take 1 tablet (40 mg total) by mouth daily. (Patient not taking: Reported on 06/06/2024) 90 tablet 0   promethazine -dextromethorphan (PROMETHAZINE -DM) 6.25-15 MG/5ML syrup Take 5 mLs by mouth 4 (four) times daily as needed for cough. (Patient not taking: Reported on 03/21/2024) 118 mL 0   tolterodine  (DETROL  LA) 2 MG 24 hr capsule Take 1 capsule (2 mg total) by mouth daily. (Patient not taking: Reported on 06/06/2024) 30 capsule 1   traZODone  (DESYREL ) 50 MG  tablet Take 1-2 tablets (50-100 mg total) by mouth at bedtime as needed for sleep. (Patient not taking: Reported on 06/06/2024) 60 tablet 3   No current facility-administered medications on file prior to visit.    Allergies  Allergen Reactions   Latex Itching   Percocet [Oxycodone -Acetaminophen ] Hives    Patient states she did fine on this medication.     Social History   Socioeconomic History   Marital status: Single    Spouse name: Not on file   Number of children: Not on file   Years of education: Not on file   Highest education level:  Not on file  Occupational History   Not on file  Tobacco Use   Smoking status: Every Day    Current packs/day: 0.50    Types: Cigarettes   Smokeless tobacco: Never  Vaping Use   Vaping status: Never Used  Substance and Sexual Activity   Alcohol use: No   Drug use: No   Sexual activity: Yes    Birth control/protection: None  Other Topics Concern   Not on file  Social History Narrative   Not on file   Social Drivers of Health   Financial Resource Strain: Low Risk  (09/02/2023)   Overall Financial Resource Strain (CARDIA)    Difficulty of Paying Living Expenses: Not hard at all  Food Insecurity: No Food Insecurity (09/02/2023)   Hunger Vital Sign    Worried About Running Out of Food in the Last Year: Never true    Ran Out of Food in the Last Year: Never true  Transportation Needs: No Transportation Needs (09/02/2023)   PRAPARE - Administrator, Civil Service (Medical): No    Lack of Transportation (Non-Medical): No  Physical Activity: Inactive (09/02/2023)   Exercise Vital Sign    Days of Exercise per Week: 0 days    Minutes of Exercise per Session: 0 min  Stress: No Stress Concern Present (09/02/2023)   [employer] of Occupational Health - Occupational Stress Questionnaire    Feeling of Stress : Not at all  Social Connections: Socially Isolated (09/02/2023)   Social Connection and Isolation Panel    Frequency of Communication with Friends and Family: Once a week    Frequency of Social Gatherings with Friends and Family: Once a week    Attends Religious Services: Never    Database administrator or Organizations: No    Attends Banker Meetings: Never    Marital Status: Divorced  Catering manager Violence: Not At Risk (09/02/2023)   Humiliation, Afraid, Rape, and Kick questionnaire    Fear of Current or Ex-Partner: No    Emotionally Abused: No    Physically Abused: No    Sexually Abused: No    Family History  Problem Relation Age of Onset    Diabetes Father    Hypertension Mother    Ovarian cancer Brother    Asthma Sister    Cancer Neg Hx    Stroke Neg Hx    Angioedema Neg Hx    Allergic rhinitis Neg Hx    Atopy Neg Hx    Eczema Neg Hx    Immunodeficiency Neg Hx    Urticaria Neg Hx     Past Surgical History:  Procedure Laterality Date   CESAREAN SECTION N/A 05/08/2017   Procedure: CESAREAN SECTION;  Surgeon: Jayne Vonn DEL, MD;  Location: [facility];  Service: Obstetrics;  Laterality: N/A;   CESAREAN SECTION N/A    Phreesia  12/09/2020   LASIK Bilateral 2006    ROS: Review of Systems Negative except as stated above  PHYSICAL EXAM: BP (!) 135/92   Pulse 98   Temp 99.1 F (37.3 C) (Oral)   Resp 16   Ht 5' 1 (1.549 m)   Wt 180 lb 6.4 oz (81.8 kg)   LMP 06/24/2024 (Approximate)   SpO2 95%   BMI 34.09 kg/m   Physical Exam HENT:     Head: Normocephalic and atraumatic.     Right Ear: Tympanic membrane, ear canal and external ear normal.     Left Ear: Tympanic membrane, ear canal and external ear normal.     Nose: Nose normal.     Mouth/Throat:     Mouth: Mucous membranes are moist.     Pharynx: Oropharynx is clear.  Eyes:     Extraocular Movements: Extraocular movements intact.     Conjunctiva/sclera: Conjunctivae normal.     Pupils: Pupils are equal, round, and reactive to light.  Neck:     Thyroid : No thyroid  mass, thyromegaly or thyroid  tenderness.  Cardiovascular:     Rate and Rhythm: Normal rate and regular rhythm.     Pulses: Normal pulses.     Heart sounds: Normal heart sounds.  Pulmonary:     Effort: Pulmonary effort is normal.     Breath sounds: Normal breath sounds.  Chest:     Comments: Patient declined. Abdominal:     General: Bowel sounds are normal.     Palpations: Abdomen is soft.  Genitourinary:    Comments: Patient declined. Musculoskeletal:        General: Normal range of motion.     Right shoulder: Normal.     Left shoulder: Normal.     Right upper arm: Normal.      Left upper arm: Normal.     Right elbow: Normal.     Left elbow: Normal.     Right forearm: Normal.     Left forearm: Normal.     Right wrist: Normal.     Left wrist: Normal.     Right hand: Normal.     Left hand: Normal.     Cervical back: Normal, normal range of motion and neck supple.     Thoracic back: Normal.     Lumbar back: Normal.     Right hip: Normal.     Left hip: Normal.     Right upper leg: Normal.     Left upper leg: Normal.     Right knee: Normal.     Left knee: Normal.     Right lower leg: Normal.     Left lower leg: Normal.     Right ankle: Normal.     Left ankle: Normal.     Right foot: Normal.     Left foot: Normal.  Skin:    General: Skin is warm and dry.     Capillary Refill: Capillary refill takes less than 2 seconds.  Neurological:     General: No focal deficit present.     Mental Status: She is alert and oriented to person, place, and time.  Psychiatric:        Mood and Affect: Mood normal.        Behavior: Behavior normal.     ASSESSMENT AND PLAN: 1. Annual physical exam (Primary) - Counseled on 150 minutes of exercise per week as tolerated, healthy eating (including decreased daily intake of saturated fats, cholesterol, added sugars, sodium), STI prevention, and  routine healthcare maintenance.  2. Screening for metabolic disorder - Routine screening.  - CMP14+EGFR  3. Screening for deficiency anemia - Routine screening.  - CBC  4. Diabetes mellitus screening - Routine screening.  - Hemoglobin A1c  5. Screening cholesterol level - Routine screening.  - Lipid panel  6. Thyroid  disorder screen - Routine screening.  - TSH  7. Encounter for screening mammogram for malignant neoplasm of breast - Routine screening.  - MM Digital Screening; Future  8. Colon cancer screening - Routine screening.  - Cologuard  9. Primary hypertension - Blood pressure not at goal during today's visit. Patient asymptomatic without chest pressure,  chest pain, palpitations, shortness of breath, worst headache of life, and any additional red flag symptoms. - Decrease Valsartan  from 80 mg to 40 mg related to side effects patient stated heart racing.  - Trial Amlodipine  as prescribed.  - Counseled on blood pressure goal of less than 130/80, low-sodium, DASH diet, medication compliance, and 150 minutes of moderate intensity exercise per week as tolerated. Counseled on medication adherence and adverse effects. - Follow-up with primary provider in 4 weeks or sooner if needed.  - valsartan  (DIOVAN ) 40 MG tablet; Take 1 tablet (40 mg total) by mouth daily.  Dispense: 90 tablet; Refill: 0 - amLODipine  (NORVASC ) 5 MG tablet; Take 1 tablet (5 mg total) by mouth daily.  Dispense: 90 tablet; Refill: 0  10. Neck stiffness - Increase Cyclobenzaprine  from 5 mg three times daily as needed to 7.5 mg three times daily as needed. Counseled on medication adherence/adverse effects.  - Referral to Orthopedic Surgery for evaluation/management. - Keep all scheduled appointments with Physical Therapy.  - Follow-up with primary provider as scheduled. - cyclobenzaprine  (FEXMID ) 7.5 MG tablet; Take 1 tablet (7.5 mg total) by mouth 3 (three) times daily as needed for muscle spasms.  Dispense: 90 tablet; Refill: 1 - Ambulatory referral to Orthopedic Surgery   Patient was given the opportunity to ask questions.  Patient verbalized understanding of the plan and was able to repeat key elements of the plan. Patient was given clear instructions to go to Emergency Department or return to medical center if symptoms don't improve, worsen, or new problems develop.The patient verbalized understanding.   Orders Placed This Encounter  Procedures   MM Digital Screening   Cologuard   CBC   Lipid panel   CMP14+EGFR   Hemoglobin A1c   TSH   Ambulatory referral to Orthopedic Surgery     Requested Prescriptions   Signed Prescriptions Disp Refills   valsartan  (DIOVAN ) 40 MG  tablet 90 tablet 0    Sig: Take 1 tablet (40 mg total) by mouth daily.   amLODipine  (NORVASC ) 5 MG tablet 90 tablet 0    Sig: Take 1 tablet (5 mg total) by mouth daily.   cyclobenzaprine  (FEXMID ) 7.5 MG tablet 90 tablet 1    Sig: Take 1 tablet (7.5 mg total) by mouth 3 (three) times daily as needed for muscle spasms.    Return in about 1 year (around 07/17/2025) for Physical per patient preference.  Greig JINNY Drones, NP

## 2024-07-18 ENCOUNTER — Ambulatory Visit: Payer: Self-pay | Admitting: Family

## 2024-07-18 DIAGNOSIS — D72829 Elevated white blood cell count, unspecified: Secondary | ICD-10-CM

## 2024-07-18 DIAGNOSIS — Z13220 Encounter for screening for lipoid disorders: Secondary | ICD-10-CM

## 2024-07-18 LAB — CBC
Hematocrit: 43.9 % (ref 34.0–46.6)
Hemoglobin: 14.8 g/dL (ref 11.1–15.9)
MCH: 30.5 pg (ref 26.6–33.0)
MCHC: 33.7 g/dL (ref 31.5–35.7)
MCV: 90 fL (ref 79–97)
Platelets: 264 x10E3/uL (ref 150–450)
RBC: 4.86 x10E6/uL (ref 3.77–5.28)
RDW: 12.1 % (ref 11.7–15.4)
WBC: 13.1 x10E3/uL — ABNORMAL HIGH (ref 3.4–10.8)

## 2024-07-18 LAB — CMP14+EGFR
ALT: 12 IU/L (ref 0–32)
AST: 9 IU/L (ref 0–40)
Albumin: 4.5 g/dL (ref 3.9–4.9)
Alkaline Phosphatase: 59 IU/L (ref 44–121)
BUN/Creatinine Ratio: 14 (ref 9–23)
BUN: 10 mg/dL (ref 6–24)
Bilirubin Total: 0.3 mg/dL (ref 0.0–1.2)
CO2: 18 mmol/L — ABNORMAL LOW (ref 20–29)
Calcium: 9.5 mg/dL (ref 8.7–10.2)
Chloride: 101 mmol/L (ref 96–106)
Creatinine, Ser: 0.71 mg/dL (ref 0.57–1.00)
Globulin, Total: 2.5 g/dL (ref 1.5–4.5)
Glucose: 94 mg/dL (ref 70–99)
Potassium: 4.5 mmol/L (ref 3.5–5.2)
Sodium: 136 mmol/L (ref 134–144)
Total Protein: 7 g/dL (ref 6.0–8.5)
eGFR: 105 mL/min/1.73 (ref >59)

## 2024-07-18 LAB — LIPID PANEL
Chol/HDL Ratio: 3.6 ratio (ref 0.0–4.4)
Cholesterol, Total: 183 mg/dL (ref 100–199)
HDL: 51 mg/dL (ref >39)
LDL Chol Calc (NIH): 114 mg/dL — ABNORMAL HIGH (ref 0–99)
Triglycerides: 100 mg/dL (ref 0–149)
VLDL Cholesterol Cal: 18 mg/dL (ref 5–40)

## 2024-07-18 LAB — TSH: TSH: 2.02 u[IU]/mL (ref 0.450–4.500)

## 2024-07-18 LAB — HEMOGLOBIN A1C
Est. average glucose Bld gHb Est-mCnc: 111 mg/dL
Hgb A1c MFr Bld: 5.5 % (ref 4.8–5.6)

## 2024-07-24 ENCOUNTER — Other Ambulatory Visit: Payer: Self-pay | Admitting: Family

## 2024-07-24 ENCOUNTER — Telehealth: Payer: Self-pay | Admitting: *Deleted

## 2024-07-24 DIAGNOSIS — E785 Hyperlipidemia, unspecified: Secondary | ICD-10-CM

## 2024-07-24 MED ORDER — ATORVASTATIN CALCIUM 20 MG PO TABS: 20.0000 mg | ORAL_TABLET | Freq: Every day | ORAL | 0 refills | Status: DC

## 2024-07-24 NOTE — Telephone Encounter (Signed)
 Copied from CRM [id number]. Topic: Clinical - Medication Question >> Jul 20, 2024  3:35 PM Myrick T wrote: Reason for CRM: patient called wanting a medication that will lower her cholesterol until she get to her visit. Please f/u with patient

## 2024-07-24 NOTE — Telephone Encounter (Signed)
 I called patient and made her aware pcp called her in a prescription

## 2024-07-24 NOTE — Telephone Encounter (Signed)
 Complete

## 2024-07-31 ENCOUNTER — Ambulatory Visit: Payer: Self-pay | Admitting: Family Medicine

## 2024-08-08 ENCOUNTER — Encounter: Payer: Self-pay | Admitting: Physical Medicine and Rehabilitation

## 2024-08-22 ENCOUNTER — Encounter: Payer: Self-pay | Admitting: Family

## 2024-08-22 NOTE — Progress Notes (Signed)
 Erroneous encounter-disregard

## 2024-09-07 ENCOUNTER — Inpatient Hospital Stay

## 2024-09-07 ENCOUNTER — Telehealth: Payer: Self-pay

## 2024-09-07 ENCOUNTER — Inpatient Hospital Stay: Admitting: Oncology

## 2024-09-07 NOTE — Telephone Encounter (Signed)
 Called patient due to not showing up for her 1000 app with Dr. Autumn, LM on VM about the appointment. Will send a message to scheduling to get her rescheduled.

## 2024-10-05 ENCOUNTER — Other Ambulatory Visit: Payer: Self-pay | Admitting: Family

## 2024-10-05 DIAGNOSIS — I10 Essential (primary) hypertension: Secondary | ICD-10-CM

## 2024-10-06 ENCOUNTER — Inpatient Hospital Stay: Attending: Hematology and Oncology | Admitting: Hematology and Oncology

## 2024-10-06 ENCOUNTER — Inpatient Hospital Stay

## 2024-10-06 VITALS — BP 118/70 | HR 108 | Temp 97.7°F | Resp 16 | Wt 187.9 lb

## 2024-10-06 DIAGNOSIS — F17210 Nicotine dependence, cigarettes, uncomplicated: Secondary | ICD-10-CM | POA: Diagnosis not present

## 2024-10-06 DIAGNOSIS — D72829 Elevated white blood cell count, unspecified: Secondary | ICD-10-CM | POA: Diagnosis not present

## 2024-10-06 DIAGNOSIS — D72825 Bandemia: Secondary | ICD-10-CM

## 2024-10-06 LAB — CBC WITH DIFFERENTIAL (CANCER CENTER ONLY)
Abs Immature Granulocytes: 0.03 K/uL (ref 0.00–0.07)
Basophils Absolute: 0.1 K/uL (ref 0.0–0.1)
Basophils Relative: 1 %
Eosinophils Absolute: 0.2 K/uL (ref 0.0–0.5)
Eosinophils Relative: 1 %
HCT: 41.9 % (ref 36.0–46.0)
Hemoglobin: 14.4 g/dL (ref 12.0–15.0)
Immature Granulocytes: 0 %
Lymphocytes Relative: 19 %
Lymphs Abs: 2.7 K/uL (ref 0.7–4.0)
MCH: 29.5 pg (ref 26.0–34.0)
MCHC: 34.4 g/dL (ref 30.0–36.0)
MCV: 85.9 fL (ref 80.0–100.0)
Monocytes Absolute: 0.9 K/uL (ref 0.1–1.0)
Monocytes Relative: 6 %
Neutro Abs: 10.1 K/uL — ABNORMAL HIGH (ref 1.7–7.7)
Neutrophils Relative %: 73 %
Platelet Count: 262 K/uL (ref 150–400)
RBC: 4.88 MIL/uL (ref 3.87–5.11)
RDW: 12.4 % (ref 11.5–15.5)
WBC Count: 14 K/uL — ABNORMAL HIGH (ref 4.0–10.5)
nRBC: 0 % (ref 0.0–0.2)

## 2024-10-06 LAB — CMP (CANCER CENTER ONLY)
ALT: 11 U/L (ref 0–44)
AST: 10 U/L — ABNORMAL LOW (ref 15–41)
Albumin: 4.6 g/dL (ref 3.5–5.0)
Alkaline Phosphatase: 56 U/L (ref 38–126)
Anion gap: 5 (ref 5–15)
BUN: 9 mg/dL (ref 6–20)
CO2: 29 mmol/L (ref 22–32)
Calcium: 9.9 mg/dL (ref 8.9–10.3)
Chloride: 103 mmol/L (ref 98–111)
Creatinine: 0.63 mg/dL (ref 0.44–1.00)
GFR, Estimated: >60 mL/min (ref >60)
Glucose, Bld: 82 mg/dL (ref 70–99)
Potassium: 3.7 mmol/L (ref 3.5–5.1)
Sodium: 137 mmol/L (ref 135–145)
Total Bilirubin: 0.4 mg/dL (ref 0.0–1.2)
Total Protein: 7.8 g/dL (ref 6.5–8.1)

## 2024-10-06 LAB — LACTATE DEHYDROGENASE: LDH: 123 U/L (ref 98–192)

## 2024-10-06 NOTE — Telephone Encounter (Signed)
 Complete

## 2024-10-06 NOTE — Progress Notes (Signed)
 [facility] [phone]   [phone]  INITIAL CONSULT NOTE  Patient Care Team: Lorren Greig PARAS, NP as PCP - General (Nurse Practitioner)  Hematological/Oncological History # Leukocytosis, Neutrophilic Predominate  07/24/2020: last seen by Dr. Federico in Hematology Clinic. D/c from clinic.  07/17/2024: WBC 13.1, Hgb 14.8, Plt 264. No differential  10/06/2024: establish care with Dr. Federico   CHIEF COMPLAINTS/PURPOSE OF CONSULTATION:  Leukocytosis   HISTORY OF PRESENTING ILLNESS:  Megan Colon 48 y.o. female with medical history significant for migraines, GERD, anxiety, and smoking who presents for evaluation of leukocytosis.  On review of the previous records Megan Colon was previously seen in our clinic and establish care on 01/24/2020.  She was last seen on 07/24/2020.  Since that time she has had persistent leukocytosis, with white blood cell 13.1, hemoglobin 14.8, and platelets of 264 on 07/17/2024.  She presents today for reevaluation of her leukocytosis.  On exam today Megan Colon reports that she has been well overall in the room since our last visit in 2021.  She reports that she unfortunately is still smoking but will be talking to her PCP soon about Wellbutrin .  She reports that she smokes about 7 cigarettes around half a pack of cigarettes per day.  She reports she has had no issues with fevers, chills, sweats or signs or symptoms concerning for inflammatory disorder such as joint pain, swelling, or rash.  She reports she does have some shortness of breath from her smoking and does have a persistent dry cough.  She reports that sometimes when she sneezes or coughs hard she can feel bladder pain.  She reports that her weight has been creeping up to 187 pounds and she would like to see it lower.  She otherwise has had no major changes in her health in the interim since our last visit.  A full 10 point ROS otherwise negative.  MEDICAL HISTORY:  Past Medical  History:  Diagnosis Date   Allergy    Phreesia 12/09/2020   Anxiety    Phreesia 12/09/2020   Arthritis    GERD (gastroesophageal reflux disease)    Lung cancer (HCC)    Father   Migraines    since childhood    SURGICAL HISTORY: Past Surgical History:  Procedure Laterality Date   CESAREAN SECTION N/A 05/08/2017   Procedure: CESAREAN SECTION;  Surgeon: Jayne Vonn DEL, MD;  Location: [facility];  Service: Obstetrics;  Laterality: N/A;   CESAREAN SECTION N/A    Phreesia 12/09/2020   LASIK Bilateral 2006    SOCIAL HISTORY: Social History   Socioeconomic History   Marital status: Single    Spouse name: Not on file   Number of children: Not on file   Years of education: Not on file   Highest education level: Not on file  Occupational History   Not on file  Tobacco Use   Smoking status: Every Day    Current packs/day: 0.50    Types: Cigarettes   Smokeless tobacco: Never  Vaping Use   Vaping status: Never Used  Substance and Sexual Activity   Alcohol use: No   Drug use: No   Sexual activity: Yes    Birth control/protection: None  Other Topics Concern   Not on file  Social History Narrative   Not on file   Social Drivers of Health   Financial Resource Strain: Low Risk  (09/02/2023)   Overall Financial Resource Strain (CARDIA)    Difficulty of Paying Living  Expenses: Not hard at all  Food Insecurity: Food Insecurity Present (10/06/2024)   Hunger Vital Sign    Worried About Running Out of Food in the Last Year: Sometimes true    Ran Out of Food in the Last Year: Sometimes true  Transportation Needs: No Transportation Needs (10/06/2024)   PRAPARE - Administrator, Civil Service (Medical): No    Lack of Transportation (Non-Medical): No  Physical Activity: Inactive (09/02/2023)   Exercise Vital Sign    Days of Exercise per Week: 0 days    Minutes of Exercise per Session: 0 min  Stress: No Stress Concern Present (09/02/2023)   [employer] of  Occupational Health - Occupational Stress Questionnaire    Feeling of Stress : Not at all  Social Connections: Socially Isolated (09/02/2023)   Social Connection and Isolation Panel    Frequency of Communication with Friends and Family: Once a week    Frequency of Social Gatherings with Friends and Family: Once a week    Attends Religious Services: Never    Database Administrator or Organizations: No    Attends Banker Meetings: Never    Marital Status: Divorced  Catering Manager Violence: Not At Risk (10/06/2024)   Humiliation, Afraid, Rape, and Kick questionnaire    Fear of Current or Ex-Partner: No    Emotionally Abused: No    Physically Abused: No    Sexually Abused: No    FAMILY HISTORY: Family History  Problem Relation Age of Onset   Diabetes Father    Hypertension Mother    Ovarian cancer Brother    Asthma Sister    Cancer Neg Hx    Stroke Neg Hx    Angioedema Neg Hx    Allergic rhinitis Neg Hx    Atopy Neg Hx    Eczema Neg Hx    Immunodeficiency Neg Hx    Urticaria Neg Hx     ALLERGIES:  is allergic to latex and percocet [oxycodone -acetaminophen ].  MEDICATIONS:  Current Outpatient Medications  Medication Sig Dispense Refill   traZODone  (DESYREL ) 50 MG tablet Take 1-2 tablets (50-100 mg total) by mouth at bedtime as needed for sleep. 60 tablet 3   amLODipine  (NORVASC ) 5 MG tablet Take 1 tablet (5 mg total) by mouth daily. 90 tablet 0   buPROPion  (WELLBUTRIN  SR) 150 MG 12 hr tablet Take 1 tablet (150 mg total) by mouth daily. 90 tablet 0   cyclobenzaprine  (FEXMID ) 7.5 MG tablet Take 1 tablet (7.5 mg total) by mouth 3 (three) times daily as needed for muscle spasms. 90 tablet 1   FLUoxetine  (PROZAC ) 10 MG tablet Take 1 tablet (10 mg total) by mouth daily. (Patient not taking: Reported on 10/06/2024) 90 tablet 0   gabapentin (NEURONTIN) 300 MG capsule Take 1 capsule (300 mg total) by mouth at bedtime. 90 capsule 0   levocetirizine (XYZAL ) 5 MG tablet Take  1 tablet (5 mg total) by mouth every evening. 30 tablet 5   omeprazole  (PRILOSEC) 40 MG capsule Take 1 capsule (40 mg total) by mouth daily. (Patient not taking: Reported on 06/06/2024) 30 capsule 3   rizatriptan  (MAXALT ) 10 MG tablet Take 1 tablet (10 mg total) by mouth as needed for migraine. May repeat in 2 hours if needed 10 tablet 0   valsartan  (DIOVAN ) 40 MG tablet Take 1 tablet (40 mg total) by mouth daily. 90 tablet 0   No current facility-administered medications for this visit.    REVIEW OF SYSTEMS:  Constitutional: ( - ) fevers, ( - )  chills , ( - ) night sweats Eyes: ( - ) blurriness of vision, ( - ) double vision, ( - ) watery eyes Ears, nose, mouth, throat, and face: ( - ) mucositis, ( - ) sore throat Respiratory: ( - ) cough, ( - ) dyspnea, ( - ) wheezes Cardiovascular: ( - ) palpitation, ( - ) chest discomfort, ( - ) lower extremity swelling Gastrointestinal:  ( - ) nausea, ( - ) heartburn, ( - ) change in bowel habits Skin: ( - ) abnormal skin rashes Lymphatics: ( - ) new lymphadenopathy, ( - ) easy bruising Neurological: ( - ) numbness, ( - ) tingling, ( - ) new weaknesses Behavioral/Psych: ( - ) mood change, ( - ) new changes  All other systems were reviewed with the patient and are negative.  PHYSICAL EXAMINATION:  Vitals:   10/06/24 0923  BP: 118/70  Pulse: (!) 108  Resp: 16  Temp: 97.7 F (36.5 C)  SpO2: 100%   Filed Weights   10/06/24 0923  Weight: 187 lb 14.4 oz (85.2 kg)    GENERAL: well appearing middle-age female in NAD  SKIN: skin color, texture, turgor are normal, no rashes or significant lesions EYES: conjunctiva are pink and non-injected, sclera clear LUNGS: clear to auscultation and percussion with normal breathing effort HEART: regular rate & rhythm and no murmurs and no lower extremity edema Musculoskeletal: no cyanosis of digits and no clubbing  PSYCH: alert & oriented x 3, fluent speech NEURO: no focal motor/sensory  deficits  LABORATORY DATA:  I have reviewed the data as listed    Latest Ref Rng & Units 10/06/2024   10:17 AM 07/17/2024    4:20 PM 02/04/2024    2:56 PM  CBC  WBC 4.0 - 10.5 K/uL 14.0  13.1  11.1   Hemoglobin 12.0 - 15.0 g/dL 85.5  85.1  84.9   Hematocrit 36.0 - 46.0 % 41.9  43.9  44.1   Platelets 150 - 400 K/uL 262  264  317        Latest Ref Rng & Units 10/06/2024   10:17 AM 07/17/2024    4:20 PM 02/23/2024   10:23 AM  CMP  Glucose 70 - 99 mg/dL 82  94  75   BUN 6 - 20 mg/dL 9  10  8    Creatinine 0.44 - 1.00 mg/dL 9.36  9.28  9.33   Sodium 135 - 145 mmol/L 137  136  139   Potassium 3.5 - 5.1 mmol/L 3.7  4.5  4.6   Chloride 98 - 111 mmol/L 103  101  102   CO2 22 - 32 mmol/L 29  18  22    Calcium  8.9 - 10.3 mg/dL 9.9  9.5  9.5   Total Protein 6.5 - 8.1 g/dL 7.8  7.0    Total Bilirubin 0.0 - 1.2 mg/dL 0.4  0.3    Alkaline Phos 38 - 126 U/L 56  59    AST 15 - 41 U/L 10  9    ALT 0 - 44 U/L 11  12       ASSESSMENT & PLAN Megan Colon 48 y.o. female with medical history significant for leukocytosis who presents for a follow up visit.    After review the labs, review the records, discussion with the patient her findings most consistent with leukocytosis secondary to smoking.  As she is decreased her smoking the white blood cell count has  subsequently returned to normal.  She still does have a mild elevation in hemoglobin today, but once again this is likely secondary to her smoking habits.  Given that this has resolved and is now stable I do not think there is any need for routine follow-up in our clinic.  I would recommend that the patient undergo routine CBCs with her primary care provider in the event that her blood counts were to become abnormal again she could be rereferred.  Patient can return to clinic as needed.   #Leukocytosis 2/2 to Smoking  --Labs today show white blood cell 14.0, hemoglobin 14.4, MCV 85.9, platelets 262 --most likely etiology is the patient's smoking.  Encourage smoking cessation.  --RTC PRN. Continue routine CBC with PCP. Please re-refer if the patient were to have new concerning symptoms.   Orders Placed This Encounter  Procedures   CBC with Differential (Cancer Center Only)    Standing Status:   Future    Number of Occurrences:   1    Expiration Date:   10/06/2025   CMP (Cancer Center only)    Standing Status:   Future    Number of Occurrences:   1    Expiration Date:   10/06/2025   Lactate dehydrogenase (LDH)    Standing Status:   Future    Number of Occurrences:   1    Expiration Date:   10/06/2025    All questions were answered. The patient knows to call the clinic with any problems, questions or concerns.  A total of more than 45 minutes were spent on this encounter with face-to-face time and non-face-to-face time, including preparing to see the patient, ordering tests and/or medications, counseling the patient and coordination of care as outlined above.   Norleen IVAR Kidney, MD Department of Hematology/Oncology [facility] Phone: [phone] Pager: [phone] Email: [email or web address]  10/15/2024 6:22 PM

## 2024-10-06 NOTE — Progress Notes (Signed)
 Megan Colon                                          MRN: [id number]   10/06/2024   The [facility] Quality Team Specialist reviewed this patient medical record for the purposes of chart review for care gap closure. The following were reviewed: abstraction for care gap closure-controlling blood pressure.    [facility] Quality Team

## 2024-10-11 ENCOUNTER — Ambulatory Visit (INDEPENDENT_AMBULATORY_CARE_PROVIDER_SITE_OTHER): Admitting: Family

## 2024-10-11 ENCOUNTER — Encounter: Payer: Self-pay | Admitting: Family

## 2024-10-11 VITALS — BP 133/83 | HR 79 | Temp 98.0°F | Resp 16 | Ht 61.0 in | Wt 185.2 lb

## 2024-10-11 DIAGNOSIS — G629 Polyneuropathy, unspecified: Secondary | ICD-10-CM | POA: Diagnosis not present

## 2024-10-11 DIAGNOSIS — Z23 Encounter for immunization: Secondary | ICD-10-CM

## 2024-10-11 DIAGNOSIS — F418 Other specified anxiety disorders: Secondary | ICD-10-CM

## 2024-10-11 DIAGNOSIS — I10 Essential (primary) hypertension: Secondary | ICD-10-CM

## 2024-10-11 DIAGNOSIS — Z1211 Encounter for screening for malignant neoplasm of colon: Secondary | ICD-10-CM

## 2024-10-11 DIAGNOSIS — M436 Torticollis: Secondary | ICD-10-CM

## 2024-10-11 DIAGNOSIS — F419 Anxiety disorder, unspecified: Secondary | ICD-10-CM

## 2024-10-11 DIAGNOSIS — Z13220 Encounter for screening for lipoid disorders: Secondary | ICD-10-CM | POA: Diagnosis not present

## 2024-10-11 MED ORDER — GABAPENTIN 300 MG PO CAPS: 300.0000 mg | ORAL_CAPSULE | Freq: Every day | ORAL | 0 refills | Status: AC

## 2024-10-11 MED ORDER — BUPROPION HCL ER (SR) 150 MG PO TB12
150.0000 mg | ORAL_TABLET | Freq: Every day | ORAL | 0 refills | Status: AC
Start: 2024-10-11 — End: ?

## 2024-10-11 MED ORDER — VALSARTAN 40 MG PO TABS: 40.0000 mg | ORAL_TABLET | Freq: Every day | ORAL | 0 refills | Status: DC

## 2024-10-11 MED ORDER — AMLODIPINE BESYLATE 5 MG PO TABS: 5.0000 mg | ORAL_TABLET | Freq: Every day | ORAL | 0 refills | Status: AC

## 2024-10-11 NOTE — Progress Notes (Signed)
 One month follow up, patient scored a 12 on the gad-7, right side neck pain

## 2024-10-11 NOTE — Progress Notes (Signed)
 Patient ID: Megan Colon, female    DOB: 10-Jul-1976  MRN: [id number]  CC: Chronic Conditions Follow-Up  Subjective: Megan Colon is a 48 y.o. female who presents for chronic conditions follow-up.   Her concerns today include:  - Doing well on Amlodipine  and Valsartan , no issues/concerns. She does not complain of red flag symptoms such as but not limited to chest pain, shortness of breath, worst headache of life, nausea/vomiting.  - Cholesterol lab. - States intermittent numbness during nighttime. Denies red flag symptoms.  - Doing well on Bupropion  SR. She denies thoughts of self-harm, suicidal ideations, homicidal ideations. - States Orthopedics never called her.  - States she never received Cologuard.   Patient Active Problem List   Diagnosis Date Noted   Other allergic rhinitis 05/01/2021   Allergic conjunctivitis of both eyes 05/01/2021   Tobacco user 05/01/2021   Coughing 05/01/2021   Heartburn 05/01/2021   Neck pain 07/31/2020   Degenerative disc disease, cervical 07/12/2020   Gestational hypertension 05/06/2017     Current Outpatient Medications on File Prior to Visit  Medication Sig Dispense Refill   cyclobenzaprine  (FEXMID ) 7.5 MG tablet Take 1 tablet (7.5 mg total) by mouth 3 (three) times daily as needed for muscle spasms. 90 tablet 1   levocetirizine (XYZAL ) 5 MG tablet Take 1 tablet (5 mg total) by mouth every evening. 30 tablet 5   rizatriptan  (MAXALT ) 10 MG tablet Take 1 tablet (10 mg total) by mouth as needed for migraine. May repeat in 2 hours if needed 10 tablet 0   traZODone  (DESYREL ) 50 MG tablet Take 1-2 tablets (50-100 mg total) by mouth at bedtime as needed for sleep. 60 tablet 3   FLUoxetine  (PROZAC ) 10 MG tablet Take 1 tablet (10 mg total) by mouth daily. (Patient not taking: Reported on 10/06/2024) 90 tablet 0   omeprazole  (PRILOSEC) 40 MG capsule Take 1 capsule (40 mg total) by mouth daily. (Patient not taking: Reported on 06/06/2024) 30 capsule 3   No  current facility-administered medications on file prior to visit.    Allergies  Allergen Reactions   Latex Itching   Percocet [Oxycodone -Acetaminophen ] Hives    Patient states she did fine on this medication.     Social History   Socioeconomic History   Marital status: Single    Spouse name: Not on file   Number of children: Not on file   Years of education: Not on file   Highest education level: Not on file  Occupational History   Not on file  Tobacco Use   Smoking status: Every Day    Current packs/day: 0.50    Types: Cigarettes   Smokeless tobacco: Never  Vaping Use   Vaping status: Never Used  Substance and Sexual Activity   Alcohol use: No   Drug use: No   Sexual activity: Yes    Birth control/protection: None  Other Topics Concern   Not on file  Social History Narrative   Not on file   Social Drivers of Health   Financial Resource Strain: Low Risk  (09/02/2023)   Overall Financial Resource Strain (CARDIA)    Difficulty of Paying Living Expenses: Not hard at all  Food Insecurity: Food Insecurity Present (10/06/2024)   Hunger Vital Sign    Worried About Running Out of Food in the Last Year: Sometimes true    Ran Out of Food in the Last Year: Sometimes true  Transportation Needs: No Transportation Needs (10/06/2024)   PRAPARE - Transportation  Lack of Transportation (Medical): No    Lack of Transportation (Non-Medical): No  Physical Activity: Inactive (09/02/2023)   Exercise Vital Sign    Days of Exercise per Week: 0 days    Minutes of Exercise per Session: 0 min  Stress: No Stress Concern Present (09/02/2023)   [employer] of Occupational Health - Occupational Stress Questionnaire    Feeling of Stress : Not at all  Social Connections: Socially Isolated (09/02/2023)   Social Connection and Isolation Panel    Frequency of Communication with Friends and Family: Once a week    Frequency of Social Gatherings with Friends and Family: Once a week     Attends Religious Services: Never    Database administrator or Organizations: No    Attends Banker Meetings: Never    Marital Status: Divorced  Catering manager Violence: Not At Risk (10/06/2024)   Humiliation, Afraid, Rape, and Kick questionnaire    Fear of Current or Ex-Partner: No    Emotionally Abused: No    Physically Abused: No    Sexually Abused: No    Family History  Problem Relation Age of Onset   Diabetes Father    Hypertension Mother    Ovarian cancer Brother    Asthma Sister    Cancer Neg Hx    Stroke Neg Hx    Angioedema Neg Hx    Allergic rhinitis Neg Hx    Atopy Neg Hx    Eczema Neg Hx    Immunodeficiency Neg Hx    Urticaria Neg Hx     Past Surgical History:  Procedure Laterality Date   CESAREAN SECTION N/A 05/08/2017   Procedure: CESAREAN SECTION;  Surgeon: Jayne Vonn DEL, MD;  Location: [facility];  Service: Obstetrics;  Laterality: N/A;   CESAREAN SECTION N/A    Phreesia 12/09/2020   LASIK Bilateral 2006    ROS: Review of Systems Negative except as stated above  PHYSICAL EXAM: BP 133/83   Pulse 79   Temp 98 F (36.7 C) (Oral)   Resp 16   Ht 5' 1 (1.549 m)   Wt 185 lb 3.2 oz (84 kg)   LMP 10/09/2024 (Exact Date)   SpO2 96%   BMI 34.99 kg/m   Physical Exam HENT:     Head: Normocephalic and atraumatic.     Nose: Nose normal.     Mouth/Throat:     Mouth: Mucous membranes are moist.     Pharynx: Oropharynx is clear.  Eyes:     Extraocular Movements: Extraocular movements intact.     Conjunctiva/sclera: Conjunctivae normal.     Pupils: Pupils are equal, round, and reactive to light.  Cardiovascular:     Rate and Rhythm: Normal rate and regular rhythm.     Pulses: Normal pulses.     Heart sounds: Normal heart sounds.  Pulmonary:     Effort: Pulmonary effort is normal.     Breath sounds: Normal breath sounds.  Musculoskeletal:        General: Normal range of motion.     Cervical back: Normal range of motion and  neck supple.  Neurological:     General: No focal deficit present.     Mental Status: She is alert and oriented to person, place, and time.  Psychiatric:        Mood and Affect: Mood normal.        Behavior: Behavior normal.     ASSESSMENT AND PLAN: 1. Primary hypertension (Primary) - Continue  Amlodipine  and Valsartan  as prescribed.  - Counseled on blood pressure goal of less than 130/80, low-sodium, DASH diet, medication compliance, and 150 minutes of moderate intensity exercise per week as tolerated. Counseled on medication adherence and adverse effects. - Follow-up with primary provider in 3 months or sooner if needed.  - amLODipine  (NORVASC ) 5 MG tablet; Take 1 tablet (5 mg total) by mouth daily.  Dispense: 90 tablet; Refill: 0 - valsartan  (DIOVAN ) 40 MG tablet; Take 1 tablet (40 mg total) by mouth daily.  Dispense: 90 tablet; Refill: 0  2. Screening cholesterol level - Routine screening.  - Lipid panel  3. Neuropathy - Gabapentin as prescribed. Counseled on medication adherence/adverse effects.  - Follow-up with primary provider as scheduled.  - gabapentin (NEURONTIN) 300 MG capsule; Take 1 capsule (300 mg total) by mouth at bedtime.  Dispense: 90 capsule; Refill: 0  4. Anxiety and depression - Patient denies thoughts of self-harm, suicidal ideations, homicidal ideations. - Bupropion  SR as prescribed. Counseled on medication adherence/adverse effects.  - Follow-up with primary provider in 3 months or sooner if needed.  - buPROPion  (WELLBUTRIN  SR) 150 MG 12 hr tablet; Take 1 tablet (150 mg total) by mouth daily.  Dispense: 90 tablet; Refill: 0  5. Neck stiffness - Referral to Orthopedic Surgery for evaluation/management. - Ambulatory referral to Orthopedic Surgery  6. Colon cancer screening - Routine screening.  - Cologuard  7. Immunization due - Administered. - Flu vaccine trivalent PF, 6mos and older(Flulaval,Afluria,Fluarix,Fluzone)  Patient was given the  opportunity to ask questions.  Patient verbalized understanding of the plan and was able to repeat key elements of the plan. Patient was given clear instructions to go to Emergency Department or return to medical center if symptoms don't improve, worsen, or new problems develop.The patient verbalized understanding.   Orders Placed This Encounter  Procedures   Flu vaccine trivalent PF, 6mos and older(Flulaval,Afluria,Fluarix,Fluzone)   Cologuard   Lipid panel   Ambulatory referral to Orthopedic Surgery     Requested Prescriptions   Signed Prescriptions Disp Refills   amLODipine  (NORVASC ) 5 MG tablet 90 tablet 0    Sig: Take 1 tablet (5 mg total) by mouth daily.   valsartan  (DIOVAN ) 40 MG tablet 90 tablet 0    Sig: Take 1 tablet (40 mg total) by mouth daily.   buPROPion  (WELLBUTRIN  SR) 150 MG 12 hr tablet 90 tablet 0    Sig: Take 1 tablet (150 mg total) by mouth daily.   gabapentin (NEURONTIN) 300 MG capsule 90 capsule 0    Sig: Take 1 capsule (300 mg total) by mouth at bedtime.    Return in about 3 months (around 01/11/2025) for Follow-Up or next available chronic conditions.  Greig JINNY Drones, NP

## 2024-10-12 ENCOUNTER — Ambulatory Visit: Payer: Self-pay | Admitting: Family

## 2024-10-12 LAB — LIPID PANEL
Chol/HDL Ratio: 4.1 ratio (ref 0.0–4.4)
Cholesterol, Total: 179 mg/dL (ref 100–199)
HDL: 44 mg/dL (ref >39)
LDL Chol Calc (NIH): 117 mg/dL — ABNORMAL HIGH (ref 0–99)
Triglycerides: 99 mg/dL (ref 0–149)
VLDL Cholesterol Cal: 18 mg/dL (ref 5–40)

## 2024-10-17 ENCOUNTER — Telehealth: Payer: Self-pay | Admitting: Emergency Medicine

## 2024-10-17 NOTE — Telephone Encounter (Signed)
 Copied from CRM [id number]. Topic: Clinical - Lab/Test Results >> Oct 17, 2024  9:39 AM Treva T wrote: Reason for CRM: Patient returning call to office, missed call, no voicemail left.   Patient can be reached back at [phone], per patient thinks call was regarding lab results.   Patient is aware of same day call back.  I called patient today and gave her results

## 2024-10-18 ENCOUNTER — Other Ambulatory Visit: Payer: Self-pay

## 2024-10-18 NOTE — Telephone Encounter (Signed)
 Refill request for Rizatriptan  10 mg tab received via fax from [facility] [address]. PCP is amy, but last filled by Dr. Tanda

## 2024-10-19 MED ORDER — RIZATRIPTAN BENZOATE 10 MG PO TABS: 10.0000 mg | ORAL_TABLET | ORAL | 1 refills | Status: AC | PRN

## 2024-10-19 NOTE — Telephone Encounter (Signed)
 Complete

## 2024-10-23 ENCOUNTER — Encounter: Payer: Self-pay | Admitting: Radiology

## 2024-10-30 DIAGNOSIS — Z1211 Encounter for screening for malignant neoplasm of colon: Secondary | ICD-10-CM | POA: Diagnosis not present

## 2024-11-06 LAB — COLOGUARD: COLOGUARD: NEGATIVE

## 2024-11-29 ENCOUNTER — Encounter: Payer: Self-pay | Admitting: Family

## 2024-12-12 ENCOUNTER — Ambulatory Visit: Admitting: Family

## 2025-01-24 ENCOUNTER — Other Ambulatory Visit: Payer: Self-pay | Admitting: Family

## 2025-01-24 DIAGNOSIS — I10 Essential (primary) hypertension: Secondary | ICD-10-CM

## 2025-01-26 NOTE — Telephone Encounter (Signed)
 Requested Prescriptions  Pending Prescriptions Disp Refills   valsartan  (DIOVAN ) 40 MG tablet [Pharmacy Med Name: VALSARTAN  40MG  TABLETS] 90 tablet 1    Sig: TAKE 1 TABLET(40 MG) BY MOUTH DAILY     Cardiovascular:  Angiotensin Receptor Blockers Passed - 01/26/2025 12:08 PM      Passed - Cr in normal range and within 180 days    Creatinine  Date Value Ref Range Status  10/06/2024 0.63 0.44 - 1.00 mg/dL Final   Creatinine, Urine  Date Value Ref Range Status  05/06/2017 135.00 mg/dL Final         Passed - K in normal range and within 180 days    Potassium  Date Value Ref Range Status  10/06/2024 3.7 3.5 - 5.1 mmol/L Final         Passed - Patient is not pregnant      Passed - Last BP in normal range    BP Readings from Last 1 Encounters:  10/11/24 133/83         Passed - Valid encounter within last 6 months    Recent Outpatient Visits           3 months ago Primary hypertension   [facility], [place], [place]   6 months ago Annual physical exam   [facility], Amy J, NP   7 months ago Primary hypertension   [facility], Amy J, NP   9 months ago Other allergic rhinitis   [facility], Jon M, [place]   10 months ago Essential hypertension   [facility], [place]

## 2025-07-17 ENCOUNTER — Encounter: Payer: Self-pay | Admitting: Family
